# Patient Record
Sex: Female | Born: 1980 | Race: Black or African American | Hispanic: No | Marital: Single | Smoking: Never smoker
Health system: Southern US, Community
[De-identification: ages and names within clinical notes are randomized; demographics above are authoritative.]

## PROBLEM LIST (undated history)

## (undated) DIAGNOSIS — N939 Abnormal uterine and vaginal bleeding, unspecified: Secondary | ICD-10-CM

## (undated) DIAGNOSIS — D649 Anemia, unspecified: Secondary | ICD-10-CM

## (undated) DIAGNOSIS — I10 Essential (primary) hypertension: Secondary | ICD-10-CM

## (undated) DIAGNOSIS — G4733 Obstructive sleep apnea (adult) (pediatric): Secondary | ICD-10-CM

## (undated) DIAGNOSIS — J301 Allergic rhinitis due to pollen: Secondary | ICD-10-CM

## (undated) DIAGNOSIS — E119 Type 2 diabetes mellitus without complications: Secondary | ICD-10-CM

## (undated) DIAGNOSIS — F0781 Postconcussional syndrome: Secondary | ICD-10-CM

## (undated) DIAGNOSIS — E785 Hyperlipidemia, unspecified: Secondary | ICD-10-CM

## (undated) DIAGNOSIS — D219 Benign neoplasm of connective and other soft tissue, unspecified: Secondary | ICD-10-CM

## (undated) DIAGNOSIS — K219 Gastro-esophageal reflux disease without esophagitis: Secondary | ICD-10-CM

## (undated) DIAGNOSIS — Z973 Presence of spectacles and contact lenses: Secondary | ICD-10-CM

## (undated) HISTORY — DX: Gastro-esophageal reflux disease without esophagitis: K21.9

## (undated) HISTORY — DX: Postconcussional syndrome: F07.81

## (undated) HISTORY — DX: Allergic rhinitis due to pollen: J30.1

## (undated) HISTORY — DX: Abnormal uterine and vaginal bleeding, unspecified: N93.9

## (undated) HISTORY — PX: ANKLE FRACTURE SURGERY: SHX122

## (undated) HISTORY — DX: Benign neoplasm of connective and other soft tissue, unspecified: D21.9

## (undated) HISTORY — DX: Type 2 diabetes mellitus without complications: E11.9

## (undated) HISTORY — DX: Anemia, unspecified: D64.9

---
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
## 2010-06-11 ENCOUNTER — Emergency Department (HOSPITAL_COMMUNITY): Admission: EM | Admit: 2010-06-11 | Discharge: 2010-06-11 | Payer: Self-pay | Admitting: Family Medicine

## 2010-09-18 ENCOUNTER — Emergency Department (HOSPITAL_COMMUNITY): Admission: EM | Admit: 2010-09-18 | Discharge: 2010-09-18 | Payer: Self-pay | Admitting: Family Medicine

## 2010-11-19 ENCOUNTER — Emergency Department (HOSPITAL_COMMUNITY)
Admission: EM | Admit: 2010-11-19 | Discharge: 2010-11-19 | Payer: Self-pay | Source: Home / Self Care | Admitting: Family Medicine

## 2010-12-02 ENCOUNTER — Emergency Department (HOSPITAL_COMMUNITY)
Admission: EM | Admit: 2010-12-02 | Discharge: 2010-12-02 | Payer: Self-pay | Source: Home / Self Care | Admitting: Emergency Medicine

## 2011-02-28 LAB — POCT URINALYSIS DIPSTICK
Glucose, UA: NEGATIVE mg/dL
Hgb urine dipstick: NEGATIVE
Ketones, ur: 15 mg/dL — AB
Nitrite: NEGATIVE
Protein, ur: 100 mg/dL — AB
Specific Gravity, Urine: 1.025 (ref 1.005–1.030)
Urobilinogen, UA: 1 mg/dL (ref 0.0–1.0)
pH: 6 (ref 5.0–8.0)

## 2011-02-28 LAB — POCT PREGNANCY, URINE: Preg Test, Ur: NEGATIVE

## 2011-03-03 LAB — POCT URINALYSIS DIPSTICK
Bilirubin Urine: NEGATIVE
Glucose, UA: NEGATIVE mg/dL
Hgb urine dipstick: NEGATIVE
Ketones, ur: NEGATIVE mg/dL
Nitrite: NEGATIVE
Protein, ur: NEGATIVE mg/dL
Specific Gravity, Urine: 1.015 (ref 1.005–1.030)
Urobilinogen, UA: 1 mg/dL (ref 0.0–1.0)
pH: 7.5 (ref 5.0–8.0)

## 2011-03-03 LAB — POCT PREGNANCY, URINE: Preg Test, Ur: NEGATIVE

## 2011-09-17 ENCOUNTER — Emergency Department (HOSPITAL_COMMUNITY)
Admission: EM | Admit: 2011-09-17 | Discharge: 2011-09-17 | Disposition: A | Discharge status: Home / Self Care | Payer: Self-pay | Attending: Emergency Medicine | Admitting: Emergency Medicine

## 2011-09-17 DIAGNOSIS — R1032 Left lower quadrant pain: Secondary | ICD-10-CM | POA: Insufficient documentation

## 2011-09-17 LAB — URINALYSIS, ROUTINE W REFLEX MICROSCOPIC
Bilirubin Urine: NEGATIVE
Glucose, UA: NEGATIVE mg/dL
Ketones, ur: NEGATIVE mg/dL
Nitrite: NEGATIVE
Protein, ur: NEGATIVE mg/dL
Specific Gravity, Urine: 1.01 (ref 1.005–1.030)
Urobilinogen, UA: 0.2 mg/dL (ref 0.0–1.0)
pH: 6.5 (ref 5.0–8.0)

## 2011-09-17 LAB — URINE MICROSCOPIC-ADD ON

## 2011-09-17 LAB — WET PREP, GENITAL
Trich, Wet Prep: NONE SEEN
Yeast Wet Prep HPF POC: NONE SEEN

## 2011-09-17 LAB — POCT PREGNANCY, URINE: Preg Test, Ur: NEGATIVE

## 2011-09-19 LAB — GC/CHLAMYDIA PROBE AMP, GENITAL
Chlamydia, DNA Probe: NEGATIVE
GC Probe Amp, Genital: NEGATIVE

## 2011-10-26 ENCOUNTER — Encounter: Payer: Self-pay | Admitting: *Deleted

## 2011-10-26 ENCOUNTER — Emergency Department (INDEPENDENT_AMBULATORY_CARE_PROVIDER_SITE_OTHER)
Admission: EM | Admit: 2011-10-26 | Discharge: 2011-10-26 | Disposition: A | Discharge status: Home / Self Care | Payer: Self-pay | Source: Home / Self Care | Attending: Family Medicine | Admitting: Family Medicine

## 2011-10-26 DIAGNOSIS — K047 Periapical abscess without sinus: Secondary | ICD-10-CM

## 2011-10-26 MED ORDER — AMOXICILLIN 500 MG PO CAPS: 500.0000 mg | ORAL_CAPSULE | Freq: Three times a day (TID) | ORAL | Status: AC

## 2011-10-26 MED ORDER — IBUPROFEN 800 MG PO TABS: 800.0000 mg | ORAL_TABLET | Freq: Three times a day (TID) | ORAL | Status: AC

## 2011-10-26 NOTE — ED Provider Notes (Signed)
History     CSN: [id number] Arrival date & time: 10/26/2011  8:34 PM   First MD Initiated Contact with Patient 10/26/11 2055      Chief Complaint  Patient presents with  . Dental Pain    onset of right upper and lower dental pain x 2 weeks     (Consider location/radiation/quality/duration/timing/severity/associated sxs/prior treatment) Patient is a 30 y.o. female presenting with tooth pain. The history is provided by the patient.  Dental PainThe primary symptoms include mouth pain. The symptoms began more than 1 week ago. The symptoms are worsening. The symptoms are recurrent. The symptoms occur constantly.  Additional symptoms include: dental sensitivity to temperature. Additional symptoms do not include: facial swelling and ear pain.    Past Medical History  Diagnosis Date  . Asthma     Past Surgical History  Procedure Date  . Ankle fracture surgery     History reviewed. No pertinent family history.  History  Substance Use Topics  . Smoking status: Never Smoker   . Smokeless tobacco: Not on file  . Alcohol Use: No    OB History    Grav Para Term Preterm Abortions TAB SAB Ect Mult Living                  Review of Systems  Constitutional: Negative.   HENT: Negative for ear pain and facial swelling.     Allergies  Review of patient's allergies indicates no known allergies.  Home Medications   Current Outpatient Rx  Name Route Sig Dispense Refill  . IBUPROFEN 200 MG PO TABS Oral Take 200 mg by mouth every 6 (six) hours as needed.        BP 146/92  Pulse 76  Temp(Src) 98.3 F (36.8 C) (Oral)  Resp 16  SpO2 100%  Physical Exam  Nursing note and vitals reviewed. Constitutional: She appears well-developed and well-nourished.  HENT:  Head: Normocephalic and atraumatic.  Right Ear: External ear normal.  Left Ear: External ear normal.  Mouth/Throat: Oropharynx is clear and moist and mucous membranes are normal. Abnormal dentition. Dental caries  present. No oropharyngeal exudate.    Lymphadenopathy:       Head (left side): No submandibular and no tonsillar adenopathy present.    She has no cervical adenopathy.    ED Course  Procedures (including critical care time)  Labs Reviewed - No data to display No results found.   No diagnosis found.    MDM          Barkley Bruns, MD 10/26/11 2129

## 2012-01-16 ENCOUNTER — Encounter (HOSPITAL_COMMUNITY): Payer: Self-pay | Admitting: Emergency Medicine

## 2012-01-16 ENCOUNTER — Emergency Department (INDEPENDENT_AMBULATORY_CARE_PROVIDER_SITE_OTHER)
Admission: EM | Admit: 2012-01-16 | Discharge: 2012-01-16 | Disposition: A | Discharge status: Home / Self Care | Payer: Self-pay | Source: Home / Self Care | Attending: Emergency Medicine | Admitting: Emergency Medicine

## 2012-01-16 DIAGNOSIS — K529 Noninfective gastroenteritis and colitis, unspecified: Secondary | ICD-10-CM

## 2012-01-16 DIAGNOSIS — K5289 Other specified noninfective gastroenteritis and colitis: Secondary | ICD-10-CM

## 2012-01-16 NOTE — ED Notes (Signed)
Pt had what she thinks is the stomach flu over the weekend but no longer has any symptoms. 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

## 2012-01-16 NOTE — ED Provider Notes (Signed)
Chief Complaint  Patient presents with  . GI Problem    History of Present Illness:  The patient has had a four-day history of nausea, vomiting, and diarrhea. She denies any fever or chills. She did have some crampy abdominal pain. No blood in the vomitus or stool. She was exposed to a friend who had the same thing. Her symptoms have completely resolved right now and she just comes in to get a note to get back to work since she does work in [employer].  Review of Systems:  Other than noted above, the patient denies any of the following symptoms: Constitutional:  No fever, chills, fatigue, weight loss or anorexia. Lungs:  No cough or shortness of breath. Heart:  No chest pain, palpitations, syncope or edema. Abdomen:  No nausea, vomiting, hematememesis, melena, diarrhea, or hematochezia. GU:  No dysuria, frequency, urgency, or hematuria. Gyn:  No vaginal discharge, itching, abnormal bleeding or pelvic pain. Skin:  No rash or itching.  PMFSH:  Past medical history, family history, social history, meds, and allergies were reviewed.  Physical Exam:   Vital signs:  BP 129/87  Pulse 83  Temp(Src) 98.6 F (37 C) (Oral)  Resp 16  SpO2 96% Gen:  Alert, oriented, in no distress. Lungs:  Breath sounds clear and equal bilaterally.  No wheezes, rales or rhonchi. Heart:  Regular rhythm.  No gallops or murmers.   Abdomen:  Abdomen was soft and flat and nontender. No organomegaly or mass. Bowel sounds are normally active. Skin:  Clear, warm and dry.  No rash.  Assessment:   Diagnoses that have been ruled out:  None  Diagnoses that are still under consideration:  None  Final diagnoses:  Gastroenteritis    Plan:   1.  The following meds were prescribed:   New Prescriptions   No medications on file   2.  The patient was instructed in symptomatic care and handouts were given. 3.  The patient was told to return if becoming worse in any way, if no better in 3 or 4 days, and given some  red flag symptoms that would indicate earlier return.  I gave her a note to return to work tomorrow.    Roque Lias, MD 01/16/12 2122

## 2012-01-23 ENCOUNTER — Encounter (HOSPITAL_COMMUNITY): Payer: Self-pay | Admitting: Emergency Medicine

## 2012-01-23 ENCOUNTER — Other Ambulatory Visit: Payer: Self-pay

## 2012-01-23 ENCOUNTER — Emergency Department (HOSPITAL_COMMUNITY): Payer: Self-pay

## 2012-01-23 ENCOUNTER — Emergency Department (HOSPITAL_COMMUNITY)
Admission: EM | Admit: 2012-01-23 | Discharge: 2012-01-23 | Disposition: A | Discharge status: Home / Self Care | Payer: Self-pay | Attending: Emergency Medicine | Admitting: Emergency Medicine

## 2012-01-23 DIAGNOSIS — J45909 Unspecified asthma, uncomplicated: Secondary | ICD-10-CM | POA: Insufficient documentation

## 2012-01-23 DIAGNOSIS — R072 Precordial pain: Secondary | ICD-10-CM | POA: Insufficient documentation

## 2012-01-23 DIAGNOSIS — D649 Anemia, unspecified: Secondary | ICD-10-CM

## 2012-01-23 DIAGNOSIS — R0602 Shortness of breath: Secondary | ICD-10-CM | POA: Insufficient documentation

## 2012-01-23 DIAGNOSIS — R079 Chest pain, unspecified: Secondary | ICD-10-CM | POA: Insufficient documentation

## 2012-01-23 LAB — BASIC METABOLIC PANEL
BUN: 8 mg/dL (ref 6–23)
CO2: 18 mEq/L — ABNORMAL LOW (ref 19–32)
Calcium: 9.2 mg/dL (ref 8.4–10.5)
Chloride: 106 mEq/L (ref 96–112)
Creatinine, Ser: 0.59 mg/dL (ref 0.50–1.10)
GFR calc Af Amer: >90 mL/min (ref >90)
GFR calc non Af Amer: >90 mL/min (ref >90)
Glucose, Bld: 98 mg/dL (ref 70–99)
Potassium: 5.3 mEq/L — ABNORMAL HIGH (ref 3.5–5.1)
Sodium: 135 mEq/L (ref 135–145)

## 2012-01-23 LAB — POCT I-STAT TROPONIN I: Troponin i, poc: 0 ng/mL (ref 0.00–0.08)

## 2012-01-23 LAB — DIFFERENTIAL
Basophils Absolute: 0 10*3/uL (ref 0.0–0.1)
Basophils Relative: 0 % (ref 0–1)
Eosinophils Absolute: 0.1 10*3/uL (ref 0.0–0.7)
Eosinophils Relative: 1 % (ref 0–5)
Lymphocytes Relative: 37 % (ref 12–46)
Lymphs Abs: 3.4 10*3/uL (ref 0.7–4.0)
Monocytes Absolute: 1 10*3/uL (ref 0.1–1.0)
Monocytes Relative: 11 % (ref 3–12)
Neutro Abs: 4.6 10*3/uL (ref 1.7–7.7)
Neutrophils Relative %: 51 % (ref 43–77)

## 2012-01-23 LAB — CBC
HCT: 30.4 % — ABNORMAL LOW (ref 36.0–46.0)
Hemoglobin: 9.2 g/dL — ABNORMAL LOW (ref 12.0–15.0)
MCH: 21.4 pg — ABNORMAL LOW (ref 26.0–34.0)
MCHC: 30.3 g/dL (ref 30.0–36.0)
MCV: 70.9 fL — ABNORMAL LOW (ref 78.0–100.0)
Platelets: 360 10*3/uL (ref 150–400)
RBC: 4.29 MIL/uL (ref 3.87–5.11)
RDW: 16.9 % — ABNORMAL HIGH (ref 11.5–15.5)
WBC: 9.1 10*3/uL (ref 4.0–10.5)

## 2012-01-23 MED ORDER — FERROUS SULFATE 325 (65 FE) MG PO TABS: 325.0000 mg | ORAL_TABLET | Freq: Every day | ORAL | Status: DC

## 2012-01-23 NOTE — ED Notes (Signed)
PT. REPORTS MID STERNAL CHEST PAIN FOR 2 WEEKS WITH SOB ,  DENIES NAUSEA OR VOMITTTING  , NO DIAPHORESIS . PT. STATES RECENT DEATH IN FAMILY .

## 2012-01-23 NOTE — ED Provider Notes (Signed)
History     CSN: [id number]  Arrival date & time 01/23/12  8657   First MD Initiated Contact with Patient 01/23/12 1947      Chief Complaint  Patient presents with  . Chest Pain     HPI  History provided by the patient. Patient is a 31 year old African American female with history of asthma presents with complaints of intermittent sternal chest pain for the past several weeks. Patient reports having a tightness associated with shortness of breath, fast breathing and increased heart rate lasting approximately 10-15 minutes intermittently. She reports symptoms are sometimes worse with decreased sleep. Patient denies having any other aggravating or alleviating factors associated with symptoms. She denies having any diaphoresis, nausea or vomiting. Patient does report the symptoms began shortly after her mother's death on 12/30/22. Her mother passed away from a intentional overdose. She denies having any family history of early cardiac death. She denies having any wheezing or asthma symptoms.    Past Medical History  Diagnosis Date  . Asthma     Past Surgical History  Procedure Date  . Ankle fracture surgery     No family history on file.  History  Substance Use Topics  . Smoking status: Never Smoker   . Smokeless tobacco: Not on file  . Alcohol Use: No    OB History    Grav Para Term Preterm Abortions TAB SAB Ect Mult Living                  Review of Systems  Constitutional: Negative for fever, chills and diaphoresis.  Respiratory: Positive for shortness of breath. Negative for cough and wheezing.   Cardiovascular: Positive for chest pain and palpitations.  Gastrointestinal: Negative for nausea, vomiting, abdominal pain, diarrhea and constipation.  All other systems reviewed and are negative.    Allergies  Review of patient's allergies indicates no known allergies.  Home Medications  No current outpatient prescriptions on file.  BP 140/85  Pulse 80  Temp  98.5 F (36.9 C)  Resp 16  SpO2 100%  LMP 12/30/2011  Physical Exam  Nursing note and vitals reviewed. Constitutional: She is oriented to person, place, and time. She appears well-developed and well-nourished. No distress.  HENT:  Head: Normocephalic and atraumatic.  Mouth/Throat: Oropharynx is clear and moist.  Neck: Normal range of motion. Neck supple.  Cardiovascular: Normal rate, regular rhythm and normal heart sounds.   Pulmonary/Chest: Effort normal and breath sounds normal. No respiratory distress. She has no wheezes. She has no rales.  Abdominal: Soft. There is no tenderness.  Musculoskeletal: She exhibits no edema and no tenderness.  Lymphadenopathy:    She has no cervical adenopathy.  Neurological: She is alert and oriented to person, place, and time.  Skin: Skin is warm and dry. No rash noted. She is not diaphoretic.  Psychiatric: She has a normal mood and affect. Her behavior is normal.    ED Course  Procedures    Labs Reviewed  CBC  DIFFERENTIAL  BASIC METABOLIC PANEL   Results for orders placed during the hospital encounter of 01/23/12  BASIC METABOLIC PANEL      Component Value Range   Sodium 135  135 - 145 (mEq/L)   Potassium 5.3 (*) 3.5 - 5.1 (mEq/L)   Chloride 106  96 - 112 (mEq/L)   CO2 18 (*) 19 - 32 (mEq/L)   Glucose, Bld 98  70 - 99 (mg/dL)   BUN 8  6 - 23 (mg/dL)  Creatinine, Ser 0.59  0.50 - 1.10 (mg/dL)   Calcium 9.2  8.4 - 69.6 (mg/dL)   GFR calc non Af Amer >90  >90 (mL/min)   GFR calc Af Amer >90  >90 (mL/min)  POCT I-STAT TROPONIN I      Component Value Range   Troponin i, poc 0.00  0.00 - 0.08 (ng/mL)   Comment 3           CBC      Component Value Range   WBC 9.1  4.0 - 10.5 (K/uL)   RBC 4.29  3.87 - 5.11 (MIL/uL)   Hemoglobin 9.2 (*) 12.0 - 15.0 (g/dL)   HCT 29.5 (*) 28.4 - 46.0 (%)   MCV 70.9 (*) 78.0 - 100.0 (fL)   MCH 21.4 (*) 26.0 - 34.0 (pg)   MCHC 30.3  30.0 - 36.0 (g/dL)   RDW 13.2 (*) 44.0 - 15.5 (%)   Platelets 360   150 - 400 (K/uL)  DIFFERENTIAL      Component Value Range   Neutrophils Relative 51  43 - 77 (%)   Neutro Abs 4.6  1.7 - 7.7 (K/uL)   Lymphocytes Relative 37  12 - 46 (%)   Lymphs Abs 3.4  0.7 - 4.0 (K/uL)   Monocytes Relative 11  3 - 12 (%)   Monocytes Absolute 1.0  0.1 - 1.0 (K/uL)   Eosinophils Relative 1  0 - 5 (%)   Eosinophils Absolute 0.1  0.0 - 0.7 (K/uL)   Basophils Relative 0  0 - 1 (%)   Basophils Absolute 0.0  0.0 - 0.1 (K/uL)     Dg Chest 2 View  01/23/2012  *RADIOLOGY REPORT*  Clinical Data: Left-sided chest pain and pressure  CHEST - 2 VIEW  Comparison: None  Findings: Artifact overlies the chest.  Heart size is normal. Mediastinal shadows are normal.  Lungs are clear.  The vascularity is normal.  No effusions.  No bony abnormalities.  IMPRESSION: No active disease  Original Report Authenticated By: Thomasenia Sales, M.D.     1. Chest pain   2. Anemia       MDM  7:50 PM patient seen and evaluated. Patient in no acute distress. Pt is PERC negative.  Patient continues to do well. EKG chest x-ray with no significant findings. The patient was discussed with attending physician. He agrees with evaluation and treatment plan.   Date: 01/23/2012  Rate: 80  Rhythm: normal sinus rhythm  QRS Axis: normal  Intervals: normal  ST/T Wave abnormalities: nonspecific T wave changes  Conduction Disutrbances:none  Narrative Interpretation: flat T-waves  Old EKG Reviewed: none available       Angus Seller, PA 01/23/12 2259

## 2012-01-24 NOTE — ED Provider Notes (Signed)
Medical screening examination/treatment/procedure(s) were conducted as a shared visit with non-physician practitioner(s) and myself.  I personally evaluated the patient during the encounter    Celene Kras, MD 01/24/12 1057

## 2012-07-23 ENCOUNTER — Encounter (HOSPITAL_COMMUNITY): Payer: Self-pay | Admitting: Emergency Medicine

## 2012-07-23 ENCOUNTER — Emergency Department (INDEPENDENT_AMBULATORY_CARE_PROVIDER_SITE_OTHER)
Admission: EM | Admit: 2012-07-23 | Discharge: 2012-07-23 | Disposition: A | Discharge status: Home / Self Care | Payer: Self-pay | Source: Home / Self Care | Attending: Emergency Medicine | Admitting: Emergency Medicine

## 2012-07-23 ENCOUNTER — Emergency Department (INDEPENDENT_AMBULATORY_CARE_PROVIDER_SITE_OTHER): Payer: Self-pay

## 2012-07-23 DIAGNOSIS — M940 Chondrocostal junction syndrome [Tietze]: Secondary | ICD-10-CM

## 2012-07-23 MED ORDER — MELOXICAM 15 MG PO TABS: 15.0000 mg | ORAL_TABLET | Freq: Every day | ORAL | Status: DC

## 2012-07-23 MED ORDER — PREDNISONE 5 MG PO KIT: 1.0000 | PACK | Freq: Every day | ORAL | Status: DC

## 2012-07-23 NOTE — ED Provider Notes (Signed)
Chief Complaint  Patient presents with  . Chest Pain    History of Present Illness:   The patient is a 31 year old female with a history since last night of sharp right pectoral chest pain which radiates into her neck. The pain comes and goes. It is rated as 7/10 in intensity, nothing makes it worse or better. She tried Advil without relief. She has some neck pain for about the past 2 weeks. She denies any associated fever or chills. No diaphoresis, shortness of breath, coughing, wheezing. She denies any palpitations, dizziness, or syncope. She has no history of heart problems or lung problems. She denies any abdominal pain, nausea, or vomiting. She was seen in the emergency room in February for similar chest pain. She had a normal workup there including a troponin.  Review of Systems:  Other than noted above, the patient denies any of the following symptoms. Systemic:  No fever, chills, sweats, or fatigue. ENT:  No nasal congestion, rhinorrhea, or sore throat. Pulmonary:  No cough, wheezing, shortness of breath, sputum production, hemoptysis. Cardiac:  No palpitations, rapid heartbeat, dizziness, presyncope or syncope. GI:  No abdominal pain, heartburn, nausea, or vomiting. Skin:  No rash or itching. Ext:  No leg pain or swelling.   PMFSH:  Past medical history, family history, social history, meds, and allergies were reviewed and updated as needed.  Physical Exam:   Vital signs:  BP 145/94  Pulse 62  Temp 98.4 F (36.9 C) (Oral)  Resp 16  SpO2 100%  LMP 07/06/2012 Gen:  Alert, oriented, in no distress, skin warm and dry. Eye:  PERRL, lids and conjunctivas normal.  Sclera non-icteric. ENT:  Mucous membranes moist, pharynx clear. Neck:  Supple, no adenopathy or tenderness.  No JVD. Lungs:  Clear to auscultation, no wheezes, rales or rhonchi.  No respiratory distress. Heart:  Regular rhythm.  No gallops, murmers, clicks or rubs. Chest:  There is mild tenderness to palpation in the  right pectoral area and over the right upper sternal border and over the sternum. Abdomen:  Soft, nontender, no organomegaly or mass.  Bowel sounds normal.  No pulsatile abdominal mass or bruit. Ext:  No edema.  No calf tenderness and Homann's sign negative.  Pulses full and equal. Skin:  Warm and dry.  No rash.   Radiology:  Dg Chest 2 View  07/23/2012  *RADIOLOGY REPORT*  Clinical Data: Right-sided chest pain for 2 days.  CHEST - 2 VIEW  Comparison: 01/23/2012.  Findings:  Cardiopericardial silhouette within normal limits. Mediastinal contours normal. Trachea midline.  No airspace disease or effusion.  IMPRESSION: No active cardiopulmonary disease.  Original Report Authenticated By: Andreas Newport, M.D.    EKG:   Date: 07/23/2012  Rate: 63  Rhythm: normal sinus rhythm  QRS Axis: normal  Intervals: normal  ST/T Wave abnormalities: nonspecific T wave changes  Conduction Disutrbances:none  Narrative Interpretation: Normal sinus rhythm, nonspecific T wave abnormalities with T wave inversions in leads III, aVF, V3, V4, and V5. Reviewing a previous EKG from 01/23/2012 reveals no change.  Old EKG Reviewed: unchanged  Assessment:  The encounter diagnosis was Costochondritis.   Plan:   1.  The following meds were prescribed:   New Prescriptions   MELOXICAM (MOBIC) 15 MG TABLET    Take 1 tablet (15 mg total) by mouth daily.   PREDNISONE 5 MG KIT    Take 1 kit (5 mg total) by mouth daily after breakfast. Prednisone 5 mg 6 day dosepack.  Take as  directed.   2.  The patient was instructed in symptomatic care and handouts were given. 3.  The patient was told to return if becoming worse in any way, if no better in 3 or 4 days, and given some red flag symptoms that would indicate earlier return.    Reuben Likes, MD 07/23/12 2055

## 2012-07-23 NOTE — ED Notes (Signed)
Pt with c/o right side upper chest pain last night when attempting to turn over in bed -intermittent sharp pain -  denies pain at this time

## 2012-09-10 ENCOUNTER — Emergency Department (HOSPITAL_COMMUNITY)
Admission: EM | Admit: 2012-09-10 | Discharge: 2012-09-10 | Disposition: A | Discharge status: Home / Self Care | Payer: Self-pay | Source: Home / Self Care

## 2012-09-12 ENCOUNTER — Emergency Department (HOSPITAL_COMMUNITY)
Admission: EM | Admit: 2012-09-12 | Discharge: 2012-09-12 | Disposition: A | Discharge status: Home / Self Care | Payer: Self-pay | Attending: Emergency Medicine | Admitting: Emergency Medicine

## 2012-09-12 ENCOUNTER — Encounter (HOSPITAL_COMMUNITY): Payer: Self-pay | Admitting: Emergency Medicine

## 2012-09-12 DIAGNOSIS — N949 Unspecified condition associated with female genital organs and menstrual cycle: Secondary | ICD-10-CM | POA: Insufficient documentation

## 2012-09-12 DIAGNOSIS — D649 Anemia, unspecified: Secondary | ICD-10-CM | POA: Insufficient documentation

## 2012-09-12 DIAGNOSIS — N938 Other specified abnormal uterine and vaginal bleeding: Secondary | ICD-10-CM | POA: Insufficient documentation

## 2012-09-12 DIAGNOSIS — N925 Other specified irregular menstruation: Secondary | ICD-10-CM | POA: Insufficient documentation

## 2012-09-12 LAB — URINALYSIS, ROUTINE W REFLEX MICROSCOPIC
Bilirubin Urine: NEGATIVE
Glucose, UA: NEGATIVE mg/dL
Ketones, ur: 15 mg/dL — AB
Nitrite: NEGATIVE
Protein, ur: 30 mg/dL — AB
Specific Gravity, Urine: 1.027 (ref 1.005–1.030)
Urobilinogen, UA: 0.2 mg/dL (ref 0.0–1.0)
pH: 5.5 (ref 5.0–8.0)

## 2012-09-12 LAB — CBC WITH DIFFERENTIAL/PLATELET
Basophils Absolute: 0.1 10*3/uL (ref 0.0–0.1)
Basophils Relative: 1 % (ref 0–1)
Eosinophils Absolute: 0.1 10*3/uL (ref 0.0–0.7)
Eosinophils Relative: 1 % (ref 0–5)
HCT: 32.6 % — ABNORMAL LOW (ref 36.0–46.0)
Hemoglobin: 9.9 g/dL — ABNORMAL LOW (ref 12.0–15.0)
Lymphocytes Relative: 34 % (ref 12–46)
Lymphs Abs: 2 10*3/uL (ref 0.7–4.0)
MCH: 22.1 pg — ABNORMAL LOW (ref 26.0–34.0)
MCHC: 30.4 g/dL (ref 30.0–36.0)
MCV: 72.8 fL — ABNORMAL LOW (ref 78.0–100.0)
Monocytes Absolute: 0.5 10*3/uL (ref 0.1–1.0)
Monocytes Relative: 9 % (ref 3–12)
Neutro Abs: 3.1 10*3/uL (ref 1.7–7.7)
Neutrophils Relative %: 55 % (ref 43–77)
Platelets: 407 10*3/uL — ABNORMAL HIGH (ref 150–400)
RBC: 4.48 MIL/uL (ref 3.87–5.11)
RDW: 17.7 % — ABNORMAL HIGH (ref 11.5–15.5)
WBC: 5.8 10*3/uL (ref 4.0–10.5)

## 2012-09-12 LAB — URINE MICROSCOPIC-ADD ON

## 2012-09-12 LAB — BASIC METABOLIC PANEL
BUN: 8 mg/dL (ref 6–23)
CO2: 26 mEq/L (ref 19–32)
Calcium: 9.5 mg/dL (ref 8.4–10.5)
Chloride: 104 mEq/L (ref 96–112)
Creatinine, Ser: 0.79 mg/dL (ref 0.50–1.10)
GFR calc Af Amer: >90 mL/min (ref >90)
GFR calc non Af Amer: >90 mL/min (ref >90)
Glucose, Bld: 78 mg/dL (ref 70–99)
Potassium: 3.5 mEq/L (ref 3.5–5.1)
Sodium: 141 mEq/L (ref 135–145)

## 2012-09-12 LAB — WET PREP, GENITAL
Clue Cells Wet Prep HPF POC: NONE SEEN
Trich, Wet Prep: NONE SEEN
Yeast Wet Prep HPF POC: NONE SEEN

## 2012-09-12 LAB — PREGNANCY, URINE: Preg Test, Ur: NEGATIVE

## 2012-09-12 MED ORDER — NORELGESTROMIN-ETH ESTRADIOL 150-35 MCG/24HR TD PTWK
1.0000 | MEDICATED_PATCH | TRANSDERMAL | Status: DC
Start: 2012-09-12 — End: 2012-12-27

## 2012-09-12 MED ORDER — IBUPROFEN 800 MG PO TABS
800.0000 mg | ORAL_TABLET | Freq: Once | ORAL | Status: AC
  Administered 2012-09-12: 800 mg via ORAL
  Filled 2012-09-12: qty 1

## 2012-09-12 MED ORDER — FERROUS SULFATE 325 (65 FE) MG PO TABS: 325.0000 mg | ORAL_TABLET | Freq: Every day | ORAL | Status: DC

## 2012-09-12 NOTE — ED Notes (Signed)
lef sided abd pain  For a few years got bad when this period started

## 2012-09-12 NOTE — ED Provider Notes (Signed)
History     CSN: [id number]  Arrival date & time 09/12/12  1056   First MD Initiated Contact with Patient 09/12/12 1130      Chief Complaint  Patient presents with  . Abdominal Pain    (Consider location/radiation/quality/duration/timing/severity/associated sxs/prior treatment) The history is provided by the patient.    31 y.o. female INAD c/o chronic heavy menstrual bleed x2 years. Pt states that she can go through 20 tampons per days, lasts 5-6 days, and irregular. Pt has never seen an Ob/gyn. Denies CP, palpations, SOB, lightheaded sensation.   LMP 09/12/2012  Past Medical History  Diagnosis Date  . Asthma     Past Surgical History  Procedure Date  . Ankle fracture surgery     No family history on file.  History  Substance Use Topics  . Smoking status: Never Smoker   . Smokeless tobacco: Not on file  . Alcohol Use: No    OB History    Grav Para Term Preterm Abortions TAB SAB Ect Mult Living                  Review of Systems  Constitutional: Negative for fever.  Respiratory: Negative for shortness of breath.   Cardiovascular: Negative for chest pain.  Gastrointestinal: Negative for nausea, vomiting, abdominal pain and diarrhea.  Genitourinary: Positive for menstrual problem.  All other systems reviewed and are negative.    Allergies  Review of patient's allergies indicates no known allergies.  Home Medications   Current Outpatient Rx  Name Route Sig Dispense Refill  . IBUPROFEN 200 MG PO TABS Oral Take 400 mg by mouth every 6 (six) hours as needed. For pain      BP 142/87  Pulse 72  Temp 98.8 F (37.1 C) (Oral)  Resp 18  SpO2 100%  Physical Exam  Nursing note and vitals reviewed. Constitutional: She is oriented to person, place, and time. She appears well-developed and well-nourished. No distress.  HENT:  Head: Normocephalic.  Mouth/Throat: Oropharynx is clear and moist.       Pale conjunctiva   Eyes: Conjunctivae normal and EOM are  normal.  Neck: Normal range of motion.  Cardiovascular: Normal rate and intact distal pulses.   Pulmonary/Chest: Effort normal and breath sounds normal. No stridor. No respiratory distress. She has no wheezes. She has no rales. She exhibits no tenderness.  Abdominal: Soft. Bowel sounds are normal. She exhibits no distension and no mass. There is no tenderness. There is no rebound and no guarding.  Genitourinary: Uterus normal. There is no rash or lesion on the right labia. There is no rash or lesion on the left labia. Uterus is not enlarged and not tender. Cervix exhibits no motion tenderness, no discharge and no friability. Right adnexum displays no mass, no tenderness and no fullness. Left adnexum displays no mass, no tenderness and no fullness.       Chaperoned by RN  No rash or cervical or adnexal tenderness. Dark blood from closed os, pooled in posterior fourchette.   Musculoskeletal: Normal range of motion.  Neurological: She is alert and oriented to person, place, and time.  Psychiatric: She has a normal mood and affect.    ED Course  Procedures (including critical care time)  Labs Reviewed  CBC WITH DIFFERENTIAL - Abnormal; Notable for the following:    Hemoglobin 9.9 (*)     HCT 32.6 (*)     MCV 72.8 (*)     MCH 22.1 (*)  RDW 17.7 (*)     Platelets 407 (*)     All other components within normal limits  URINALYSIS, ROUTINE W REFLEX MICROSCOPIC - Abnormal; Notable for the following:    APPearance HAZY (*)     Hgb urine dipstick LARGE (*)     Ketones, ur 15 (*)     Protein, ur 30 (*)     Leukocytes, UA SMALL (*)     All other components within normal limits  URINE MICROSCOPIC-ADD ON - Abnormal; Notable for the following:    Squamous Epithelial / LPF FEW (*)     All other components within normal limits  PREGNANCY, URINE  BASIC METABOLIC PANEL  WET PREP, GENITAL  GC/CHLAMYDIA PROBE AMP, GENITAL   No results found.   1. DUB (dysfunctional uterine bleeding)        MDM  dDx fibroids vs. DUB vs PCOS.   Pt has chronic menorrhagia is mildly anemic, no abnormalities on pelvic exam.    Pelvic does not indicate uterine mass. Recommend Ob follow up.   New Prescriptions   FERROUS SULFATE 325 (65 FE) MG TABLET    Take 1 tablet (325 mg total) by mouth daily.   NORELGESTROMIN-ETHINYL ESTRADIOL (ORTHO EVRA) 150-20 MCG/24HR TRANSDERMAL PATCH    Place 1 patch onto the skin once a week.         Wynetta Emery, PA-C 09/12/12 1421

## 2012-09-13 LAB — GC/CHLAMYDIA PROBE AMP, GENITAL
Chlamydia, DNA Probe: NEGATIVE
GC Probe Amp, Genital: NEGATIVE

## 2012-09-15 NOTE — ED Provider Notes (Signed)
Medical screening examination/treatment/procedure(s) were performed by non-physician practitioner and as supervising physician I was immediately available for consultation/collaboration.  Flint Melter, MD 09/15/12 1152

## 2012-12-27 ENCOUNTER — Encounter (HOSPITAL_COMMUNITY): Payer: Self-pay | Admitting: Emergency Medicine

## 2012-12-27 ENCOUNTER — Emergency Department (HOSPITAL_COMMUNITY)
Admission: EM | Admit: 2012-12-27 | Discharge: 2012-12-27 | Disposition: A | Discharge status: Home / Self Care | Payer: Self-pay | Attending: Emergency Medicine | Admitting: Emergency Medicine

## 2012-12-27 DIAGNOSIS — J020 Streptococcal pharyngitis: Secondary | ICD-10-CM | POA: Insufficient documentation

## 2012-12-27 DIAGNOSIS — IMO0001 Reserved for inherently not codable concepts without codable children: Secondary | ICD-10-CM | POA: Insufficient documentation

## 2012-12-27 DIAGNOSIS — R52 Pain, unspecified: Secondary | ICD-10-CM | POA: Insufficient documentation

## 2012-12-27 DIAGNOSIS — Z79899 Other long term (current) drug therapy: Secondary | ICD-10-CM | POA: Insufficient documentation

## 2012-12-27 DIAGNOSIS — J3489 Other specified disorders of nose and nasal sinuses: Secondary | ICD-10-CM | POA: Insufficient documentation

## 2012-12-27 DIAGNOSIS — J45909 Unspecified asthma, uncomplicated: Secondary | ICD-10-CM | POA: Insufficient documentation

## 2012-12-27 LAB — RAPID STREP SCREEN (MED CTR MEBANE ONLY): Streptococcus, Group A Screen (Direct): NEGATIVE

## 2012-12-27 MED ORDER — ACETAMINOPHEN 500 MG PO TABS: 500.0000 mg | ORAL_TABLET | Freq: Four times a day (QID) | ORAL | Status: DC | PRN

## 2012-12-27 MED ORDER — PENICILLIN G BENZATHINE 1200000 UNIT/2ML IM SUSP
1.2000 10*6.[IU] | Freq: Once | INTRAMUSCULAR | Status: AC
  Administered 2012-12-27: 1.2 10*6.[IU] via INTRAMUSCULAR
  Filled 2012-12-27: qty 2

## 2012-12-27 NOTE — ED Notes (Signed)
Onset 1 day ago dry cough and nasal congestion yellow with general body achy and sore throat 8/10 airway intact bilateral equal chest rise and fall.

## 2012-12-27 NOTE — ED Notes (Signed)
Onset of cold sx began last night. Today presents with runny nose, cough, weakness. A&Ox4, ambulatory, nad. Mask on from triage.

## 2012-12-27 NOTE — ED Provider Notes (Signed)
History     CSN: [id number]  Arrival date & time 12/27/12  1024   First MD Initiated Contact with Patient 12/27/12 1046      Chief Complaint  Patient presents with  . Cough  . Generalized Body Aches    (Consider location/radiation/quality/duration/timing/severity/associated sxs/prior treatment) HPI Comments: Patient is a 32 year old female who presents with a 1 day history of sore throat. Symptoms started gradually and progressively worsened since the onset. The pain is sharp, severe, and does not radiate. The pain is constant and made worse with swallowing and talking. She has not tried anything for symptoms. Associated symptoms include body aches, nasal congestion, and occasional dry cough. No alleviating factors.    Past Medical History  Diagnosis Date  . Asthma     Past Surgical History  Procedure Date  . Ankle fracture surgery     No family history on file.  History  Substance Use Topics  . Smoking status: Never Smoker   . Smokeless tobacco: Not on file  . Alcohol Use: No    OB History    Grav Para Term Preterm Abortions TAB SAB Ect Mult Living                  Review of Systems  HENT: Positive for congestion and sore throat.   Musculoskeletal: Positive for myalgias.  All other systems reviewed and are negative.    Allergies  Review of patient's allergies indicates no known allergies.  Home Medications   Current Outpatient Rx  Name  Route  Sig  Dispense  Refill  . GUAIFENESIN 100 MG/5ML PO SYRP   Oral   Take 400 mg by mouth 3 (three) times daily as needed. For cough         . LORATADINE 10 MG PO TABS   Oral   Take 10 mg by mouth daily.           BP 133/87  Pulse 75  Temp 98.3 F (36.8 C) (Oral)  Resp 18  SpO2 97%  Physical Exam  Nursing note and vitals reviewed. Constitutional: She is oriented to person, place, and time. She appears well-developed and well-nourished. No distress.  HENT:  Head: Normocephalic and atraumatic.   Tonsillar erythema, edema and exudate noted. Edema is equal bilaterally.   Eyes: Conjunctivae normal are normal.  Neck: Normal range of motion. Neck supple.  Cardiovascular: Normal rate and regular rhythm.  Exam reveals no gallop and no friction rub.   No murmur heard. Pulmonary/Chest: Effort normal and breath sounds normal. She has no wheezes. She has no rales. She exhibits no tenderness.  Abdominal: Soft. There is no tenderness.  Musculoskeletal: Normal range of motion.  Neurological: She is alert and oriented to person, place, and time. Coordination normal.       Speech is goal-oriented. Moves limbs without ataxia.   Skin: Skin is warm and dry.  Psychiatric: She has a normal mood and affect. Her behavior is normal.    ED Course  Procedures (including critical care time)   Labs Reviewed  RAPID STREP SCREEN   No results found.   1. Strep pharyngitis       MDM  11:14 AM Rapid strep pending.   Strep test negative, however, I will treat her empirically due to clinical impression. Patient will have Penicillin G. I will discharge her with Tylenol for pain. Patient afebrile and non toxic appearing. Patient instructed to return with worsening or concerning symptoms.  Emilia Beck, PA-C 12/31/12 1459

## 2012-12-31 NOTE — ED Provider Notes (Signed)
Medical screening examination/treatment/procedure(s) were performed by non-physician practitioner and as supervising physician I was immediately available for consultation/collaboration.   David H Yao, MD 12/31/12 1505 

## 2013-01-01 ENCOUNTER — Encounter (HOSPITAL_COMMUNITY): Payer: Self-pay | Admitting: *Deleted

## 2013-01-01 ENCOUNTER — Emergency Department (HOSPITAL_COMMUNITY): Payer: Self-pay

## 2013-01-01 ENCOUNTER — Emergency Department (HOSPITAL_COMMUNITY)
Admission: EM | Admit: 2013-01-01 | Discharge: 2013-01-01 | Disposition: A | Discharge status: Home / Self Care | Payer: Self-pay | Attending: Emergency Medicine | Admitting: Emergency Medicine

## 2013-01-01 DIAGNOSIS — R1031 Right lower quadrant pain: Secondary | ICD-10-CM | POA: Insufficient documentation

## 2013-01-01 DIAGNOSIS — J45909 Unspecified asthma, uncomplicated: Secondary | ICD-10-CM | POA: Insufficient documentation

## 2013-01-01 DIAGNOSIS — Z3202 Encounter for pregnancy test, result negative: Secondary | ICD-10-CM | POA: Insufficient documentation

## 2013-01-01 DIAGNOSIS — B9689 Other specified bacterial agents as the cause of diseases classified elsewhere: Secondary | ICD-10-CM

## 2013-01-01 DIAGNOSIS — R34 Anuria and oliguria: Secondary | ICD-10-CM | POA: Insufficient documentation

## 2013-01-01 DIAGNOSIS — R109 Unspecified abdominal pain: Secondary | ICD-10-CM

## 2013-01-01 DIAGNOSIS — Z79899 Other long term (current) drug therapy: Secondary | ICD-10-CM | POA: Insufficient documentation

## 2013-01-01 DIAGNOSIS — R112 Nausea with vomiting, unspecified: Secondary | ICD-10-CM | POA: Insufficient documentation

## 2013-01-01 DIAGNOSIS — N760 Acute vaginitis: Secondary | ICD-10-CM | POA: Insufficient documentation

## 2013-01-01 LAB — CBC WITH DIFFERENTIAL/PLATELET
Basophils Absolute: 0 10*3/uL (ref 0.0–0.1)
Basophils Relative: 0 % (ref 0–1)
Eosinophils Absolute: 0.1 10*3/uL (ref 0.0–0.7)
Eosinophils Relative: 0 % (ref 0–5)
HCT: 34.9 % — ABNORMAL LOW (ref 36.0–46.0)
Hemoglobin: 11 g/dL — ABNORMAL LOW (ref 12.0–15.0)
Lymphocytes Relative: 21 % (ref 12–46)
Lymphs Abs: 2.6 10*3/uL (ref 0.7–4.0)
MCH: 22.9 pg — ABNORMAL LOW (ref 26.0–34.0)
MCHC: 31.5 g/dL (ref 30.0–36.0)
MCV: 72.6 fL — ABNORMAL LOW (ref 78.0–100.0)
Monocytes Absolute: 1.1 10*3/uL — ABNORMAL HIGH (ref 0.1–1.0)
Monocytes Relative: 9 % (ref 3–12)
Neutro Abs: 8.4 10*3/uL — ABNORMAL HIGH (ref 1.7–7.7)
Neutrophils Relative %: 69 % (ref 43–77)
Platelets: 332 10*3/uL (ref 150–400)
RBC: 4.81 MIL/uL (ref 3.87–5.11)
RDW: 17.6 % — ABNORMAL HIGH (ref 11.5–15.5)
WBC: 12.2 10*3/uL — ABNORMAL HIGH (ref 4.0–10.5)

## 2013-01-01 LAB — URINALYSIS, ROUTINE W REFLEX MICROSCOPIC
Bilirubin Urine: NEGATIVE
Glucose, UA: NEGATIVE mg/dL
Hgb urine dipstick: NEGATIVE
Ketones, ur: 15 mg/dL — AB
Nitrite: NEGATIVE
Protein, ur: 30 mg/dL — AB
Specific Gravity, Urine: 1.039 — ABNORMAL HIGH (ref 1.005–1.030)
Urobilinogen, UA: 0.2 mg/dL (ref 0.0–1.0)
pH: 6 (ref 5.0–8.0)

## 2013-01-01 LAB — COMPREHENSIVE METABOLIC PANEL
ALT: 9 U/L (ref 0–35)
AST: 14 U/L (ref 0–37)
Albumin: 4.1 g/dL (ref 3.5–5.2)
Alkaline Phosphatase: 95 U/L (ref 39–117)
BUN: 7 mg/dL (ref 6–23)
CO2: 28 mEq/L (ref 19–32)
Calcium: 9.6 mg/dL (ref 8.4–10.5)
Chloride: 101 mEq/L (ref 96–112)
Creatinine, Ser: 0.8 mg/dL (ref 0.50–1.10)
GFR calc Af Amer: >90 mL/min (ref >90)
GFR calc non Af Amer: >90 mL/min (ref >90)
Glucose, Bld: 85 mg/dL (ref 70–99)
Potassium: 3.3 mEq/L — ABNORMAL LOW (ref 3.5–5.1)
Sodium: 138 mEq/L (ref 135–145)
Total Bilirubin: 0.4 mg/dL (ref 0.3–1.2)
Total Protein: 8.2 g/dL (ref 6.0–8.3)

## 2013-01-01 LAB — POCT PREGNANCY, URINE: Preg Test, Ur: NEGATIVE

## 2013-01-01 LAB — WET PREP, GENITAL
Trich, Wet Prep: NONE SEEN
Yeast Wet Prep HPF POC: NONE SEEN

## 2013-01-01 LAB — URINE MICROSCOPIC-ADD ON

## 2013-01-01 LAB — LIPASE, BLOOD: Lipase: 15 U/L (ref 11–59)

## 2013-01-01 MED ORDER — AZITHROMYCIN 1 G PO PACK
1.0000 g | PACK | Freq: Once | ORAL | Status: AC
  Administered 2013-01-01: 1 g via ORAL
  Filled 2013-01-01: qty 1

## 2013-01-01 MED ORDER — ONDANSETRON HCL 4 MG/2ML IJ SOLN
4.0000 mg | Freq: Once | INTRAMUSCULAR | Status: AC
  Administered 2013-01-01: 4 mg via INTRAVENOUS
  Filled 2013-01-01: qty 2

## 2013-01-01 MED ORDER — MORPHINE SULFATE 4 MG/ML IJ SOLN: 4.0000 mg | INTRAMUSCULAR | Status: DC | PRN

## 2013-01-01 MED ORDER — CEFTRIAXONE SODIUM 250 MG IJ SOLR
250.0000 mg | Freq: Once | INTRAMUSCULAR | Status: AC
  Administered 2013-01-01: 250 mg via INTRAMUSCULAR
  Filled 2013-01-01: qty 250

## 2013-01-01 MED ORDER — ONDANSETRON HCL 4 MG PO TABS: 4.0000 mg | ORAL_TABLET | Freq: Three times a day (TID) | ORAL | Status: DC | PRN

## 2013-01-01 MED ORDER — METRONIDAZOLE 500 MG PO TABS
2000.0000 mg | ORAL_TABLET | Freq: Once | ORAL | Status: AC
  Administered 2013-01-01: 2000 mg via ORAL
  Filled 2013-01-01: qty 1
  Filled 2013-01-01: qty 3

## 2013-01-01 MED ORDER — IOHEXOL 300 MG/ML  SOLN
25.0000 mL | INTRAMUSCULAR | Status: AC
  Administered 2013-01-01 (×2): 25 mL via ORAL

## 2013-01-01 MED ORDER — OXYCODONE-ACETAMINOPHEN 5-325 MG PO TABS: ORAL_TABLET | ORAL | Status: DC

## 2013-01-01 MED ORDER — ONDANSETRON HCL 4 MG/2ML IJ SOLN: 4.0000 mg | Freq: Once | INTRAMUSCULAR | Status: DC

## 2013-01-01 MED ORDER — DEXTROSE 5 % IV SOLN: 250.0000 mg | Freq: Once | INTRAVENOUS | Status: DC

## 2013-01-01 MED ORDER — LIDOCAINE HCL (PF) 1 % IJ SOLN
INTRAMUSCULAR | Status: AC
  Administered 2013-01-01: 1.2 mL
  Filled 2013-01-01: qty 5

## 2013-01-01 MED ORDER — IOHEXOL 300 MG/ML  SOLN
100.0000 mL | Freq: Once | INTRAMUSCULAR | Status: AC | PRN
  Administered 2013-01-01: 100 mL via INTRAVENOUS

## 2013-01-01 MED ORDER — MORPHINE SULFATE 4 MG/ML IJ SOLN
6.0000 mg | Freq: Once | INTRAMUSCULAR | Status: AC
  Administered 2013-01-01: 6 mg via INTRAVENOUS
  Filled 2013-01-01: qty 2

## 2013-01-01 NOTE — ED Notes (Signed)
Notified CT of pt completion of contrast.

## 2013-01-01 NOTE — ED Provider Notes (Addendum)
History     CSN: [id number]  Arrival date & time 01/01/13  1204   First MD Initiated Contact with Patient 01/01/13 1247      Chief Complaint  Patient presents with  . Abdominal Pain    (Consider location/radiation/quality/duration/timing/severity/associated sxs/prior treatment) HPI  Peggy Schmidt is a 32 y.o. female complaining of severe suprapubic pain starting yesterday. Patient then developed nausea and vomiting. She took Dulcolax tabs for constipation last bowel movement was several weeks ago this is not unusual for her. She rates her pain as severe 10 out of 10 last menstrual period was in 12 of 2013. Patient denies chest pain, cough, fever, vaginal discharge, dysuria. Patient endorses urinary hesitancy.    Past Medical History  Diagnosis Date  . Asthma     Past Surgical History  Procedure Date  . Ankle fracture surgery     No family history on file.  History  Substance Use Topics  . Smoking status: Never Smoker   . Smokeless tobacco: Not on file  . Alcohol Use: No    OB History    Grav Para Term Preterm Abortions TAB SAB Ect Mult Living                  Review of Systems  Constitutional: Negative for fever.  Respiratory: Negative for shortness of breath.   Cardiovascular: Negative for chest pain.  Gastrointestinal: Positive for nausea and vomiting. Negative for diarrhea.  Genitourinary: Positive for decreased urine volume.  All other systems reviewed and are negative.    Allergies  Review of patient's allergies indicates no known allergies.  Home Medications   Current Outpatient Rx  Name  Route  Sig  Dispense  Refill  . ACETAMINOPHEN 500 MG PO TABS   Oral   Take 1 tablet (500 mg total) by mouth every 6 (six) hours as needed for pain.   30 tablet   0   . GUAIFENESIN 100 MG/5ML PO SYRP   Oral   Take 400 mg by mouth 3 (three) times daily as needed. For cough         . LORATADINE 10 MG PO TABS   Oral   Take 10 mg by mouth daily.           BP 129/85  Pulse 98  Temp 98 F (36.7 C)  Resp 18  SpO2 100%  Physical Exam  Nursing note and vitals reviewed. Constitutional: She is oriented to person, place, and time. She appears well-developed and well-nourished. No distress.  HENT:  Head: Normocephalic.  Mouth/Throat: Oropharynx is clear and moist.  Eyes: Conjunctivae normal and EOM are normal.  Cardiovascular: Normal rate.   Pulmonary/Chest: Effort normal. No stridor.  Abdominal: Soft. Bowel sounds are normal. She exhibits no distension and no mass. There is tenderness. There is no rebound and no guarding.       Patient is tender to moderate palpation of the suprapubic area and right lower quadrant.  Genitourinary:       No CVA tenderness bilaterally.  Pelvic exam chaperoned by technician. Thick white vaginal discharge. No cervical motion or adnexal tenderness.  Musculoskeletal: Normal range of motion.  Neurological: She is alert and oriented to person, place, and time.  Psychiatric: She has a normal mood and affect.    ED Course  Procedures (including critical care time)  Labs Reviewed  CBC WITH DIFFERENTIAL - Abnormal; Notable for the following:    WBC 12.2 (*)     Hemoglobin 11.0 (*)  HCT 34.9 (*)     MCV 72.6 (*)     MCH 22.9 (*)     RDW 17.6 (*)     Neutro Abs 8.4 (*)     Monocytes Absolute 1.1 (*)     All other components within normal limits  COMPREHENSIVE METABOLIC PANEL - Abnormal; Notable for the following:    Potassium 3.3 (*)     All other components within normal limits  URINALYSIS, ROUTINE W REFLEX MICROSCOPIC - Abnormal; Notable for the following:    Color, Urine AMBER (*)  BIOCHEMICALS MAY BE AFFECTED BY COLOR   APPearance CLOUDY (*)     Specific Gravity, Urine 1.039 (*)     Ketones, ur 15 (*)     Protein, ur 30 (*)     Leukocytes, UA SMALL (*)     All other components within normal limits  WET PREP, GENITAL - Abnormal; Notable for the following:    Clue Cells Wet Prep HPF POC FEW  (*)     WBC, Wet Prep HPF POC MANY (*)     All other components within normal limits  URINE MICROSCOPIC-ADD ON - Abnormal; Notable for the following:    Squamous Epithelial / LPF FEW (*)     Bacteria, UA FEW (*)     All other components within normal limits  LIPASE, BLOOD  POCT PREGNANCY, URINE  GC/CHLAMYDIA PROBE AMP  URINE CULTURE   Ct Abdomen Pelvis W Contrast  01/01/2013  *RADIOLOGY REPORT*  Clinical Data: Abdominal pain, nausea, vomiting.  CT ABDOMEN AND PELVIS WITH CONTRAST  Technique:  Multidetector CT imaging of the abdomen and pelvis was performed following the standard protocol during bolus administration of intravenous contrast.  Contrast: OMNIPAQUE IOHEXOL 300 MG/ML  SOLN  Comparison: Plain films 09/18/2010  Findings: Lung bases are clear.  No effusions.  Heart is normal size.  Liver, gallbladder, spleen, pancreas, stomach, adrenals are normal. Left mid pole renal stones.  No ureteral stones bilaterally.  No hydronephrosis.  Appendix is visualized and is normal. Bowel grossly unremarkable. No free fluid, free air, or adenopathy.  Large low density area within the anterior aspect of the uterus measuring up to 5.5 cm. This presumably represents cystic degeneration of a fibroid.  No free fluid or free air.  No acute bony abnormality.  IMPRESSION: 5.5 cm low density area centrally within the anterior uterus, presumably cystic degeneration of a fibroid.  This can be further characterized with pelvic ultrasound or MRI if felt clinically indicated.  Appendix is normal.  Left nephrolithiasis.  No ureteral stones or hydronephrosis.   Original Report Authenticated By: Charlett Nose, M.D.      1. Abdominal pain   2. Nausea and vomiting   3. Bacterial vaginosis       MDM  Patient is tender in the right lower quadrant. Concern for appendicitis. CT abdomen pelvis to rule out appy.  Patient is a mild leukocytosis of 12.2. UA,CMP are all unremarkable.  Wet prep shows clue cells and many  white blood cells. Discusses the patient I will treat her for gonorrhea Chlamydia and bacterial vaginosis  Patient signed out to PA Muthersbaugh at shift change. Plan is to d/c home with pain and nausea medication the CT is negative.      Wynetta Emery, PA-C 01/01/13 1544  CT shows a normal appendix and a abnormality in the anterior uterus consistent with a cystic degeneration of a fibroid.  Patient will be encouraged to followup with GYN.  Pt verbalized understanding and agrees with care plan. Outpatient follow-up and return precautions given.    New Prescriptions   ONDANSETRON (ZOFRAN) 4 MG TABLET    Take 1 tablet (4 mg total) by mouth every 8 (eight) hours as needed for nausea.   OXYCODONE-ACETAMINOPHEN (PERCOCET/ROXICET) 5-325 MG PER TABLET    1 to 2 tabs PO q6hrs  PRN for pain      Wynetta Emery, PA-C 01/01/13 1639

## 2013-01-01 NOTE — ED Notes (Signed)
Pt presents to department for evaluation of lower abdominal pain, nausea and vomiting. Onset last night while at home. Took laxative with no relief. 9/10 pain at the time. Lower abdomen tender to palpation. Bowel sounds present all quadrants. Denies vaginal discharge. Denies vaginal bleeding. She is alert and oriented x4. Skin warm and dry.

## 2013-01-01 NOTE — ED Notes (Signed)
Pt is here with lower abdominal pain since last nite.  Pt took dulcolax tabs for constipation, patient states started vomiting this am with minimal results from laxative.  No vaginal discharge or bleeding.  LMP- Dec 2013.  No urinary symptoms

## 2013-01-01 NOTE — ED Provider Notes (Signed)
Medical screening examination/treatment/procedure(s) were performed by non-physician practitioner and as supervising physician I was immediately available for consultation/collaboration.   Dione Booze, MD 01/01/13 [phone]

## 2013-01-01 NOTE — ED Provider Notes (Signed)
Medical screening examination/treatment/procedure(s) were performed by non-physician practitioner and as supervising physician I was immediately available for consultation/collaboration.    Jon R Knapp, MD 01/01/13 1615 

## 2013-01-01 NOTE — ED Notes (Signed)
Pt aware of need of urine specimen; pt cannot go at this time but will attempt later

## 2013-01-01 NOTE — ED Provider Notes (Signed)
Care Assumed from [facility], PA-C.    S: 32 y.o. with severe abdominal pain with associated N/V beginning yesterday  PE/labs: RLQ tenderness, pelvic exam unremarkable  DDX: concern for appendicitis  Plan: CT to r/o appy, will d/c home with symptomatic therapy if negative  4:22 PM CT abd - 5.5 cm low density area centrally within the anterior uterus, presumably cystic degeneration of a fibroid. Appendix is normal. Left nephrolithiasis. No ureteral stones or hydronephrosis  Will D/c home with symptomatic therapy.     Dierdre Forth, PA-C 01/01/13 1950

## 2013-01-01 NOTE — ED Notes (Signed)
Pt states that she is pain free at the present.

## 2013-01-01 NOTE — ED Notes (Signed)
Family at bedside. 

## 2013-01-01 NOTE — ED Notes (Signed)
PA at the bedside for pelvic.

## 2013-01-02 LAB — URINE CULTURE: Colony Count: 5000

## 2013-01-02 LAB — GC/CHLAMYDIA PROBE AMP
CT Probe RNA: NEGATIVE
GC Probe RNA: NEGATIVE

## 2013-01-02 NOTE — ED Provider Notes (Signed)
Medical screening examination/treatment/procedure(s) were performed by non-physician practitioner and as supervising physician I was immediately available for consultation/collaboration.    Celene Kras, MD 01/02/13 [phone]

## 2013-01-12 ENCOUNTER — Emergency Department (HOSPITAL_COMMUNITY): Payer: Worker's Compensation

## 2013-01-12 ENCOUNTER — Encounter (HOSPITAL_COMMUNITY): Payer: Self-pay | Admitting: *Deleted

## 2013-01-12 ENCOUNTER — Emergency Department (HOSPITAL_COMMUNITY)
Admission: EM | Admit: 2013-01-12 | Discharge: 2013-01-12 | Disposition: A | Discharge status: Home / Self Care | Payer: Worker's Compensation | Attending: Emergency Medicine | Admitting: Emergency Medicine

## 2013-01-12 DIAGNOSIS — Y9289 Other specified places as the place of occurrence of the external cause: Secondary | ICD-10-CM | POA: Insufficient documentation

## 2013-01-12 DIAGNOSIS — S86819A Strain of other muscle(s) and tendon(s) at lower leg level, unspecified leg, initial encounter: Secondary | ICD-10-CM | POA: Insufficient documentation

## 2013-01-12 DIAGNOSIS — W010XXA Fall on same level from slipping, tripping and stumbling without subsequent striking against object, initial encounter: Secondary | ICD-10-CM | POA: Insufficient documentation

## 2013-01-12 DIAGNOSIS — S8391XA Sprain of unspecified site of right knee, initial encounter: Secondary | ICD-10-CM

## 2013-01-12 DIAGNOSIS — Y939 Activity, unspecified: Secondary | ICD-10-CM | POA: Insufficient documentation

## 2013-01-12 DIAGNOSIS — S838X9A Sprain of other specified parts of unspecified knee, initial encounter: Secondary | ICD-10-CM | POA: Insufficient documentation

## 2013-01-12 DIAGNOSIS — J45909 Unspecified asthma, uncomplicated: Secondary | ICD-10-CM | POA: Insufficient documentation

## 2013-01-12 MED ORDER — IBUPROFEN 800 MG PO TABS: 800.0000 mg | ORAL_TABLET | Freq: Three times a day (TID) | ORAL | Status: DC

## 2013-01-12 MED ORDER — HYDROCODONE-ACETAMINOPHEN 5-325 MG PO TABS
1.0000 | ORAL_TABLET | Freq: Once | ORAL | Status: AC
  Administered 2013-01-12: 1 via ORAL
  Filled 2013-01-12: qty 1

## 2013-01-12 MED ORDER — HYDROCODONE-ACETAMINOPHEN 5-325 MG PO TABS: 1.0000 | ORAL_TABLET | ORAL | Status: DC | PRN

## 2013-01-12 NOTE — ED Notes (Signed)
Pt given discharge paperwork; pt verbalized understanding of discharge with no additional questions; e-signature obtained; 

## 2013-01-12 NOTE — ED Notes (Signed)
Pt fell this am at work and has pain in right knee, pt ambulatory

## 2013-01-12 NOTE — ED Provider Notes (Signed)
History     CSN: [id number]  Arrival date & time 01/12/13  1857   First MD Initiated Contact with Patient 01/12/13 2006      Chief Complaint  Patient presents with  . Knee Injury    (Consider location/radiation/quality/duration/timing/severity/associated sxs/prior treatment) HPI History provided by pt.   Pt slipped on greasy floor at work this evening and fell, landing on her right side.  Did not hit head and denies pain in neck/back.  C/o pain in right knee only, particularly medial aspect.  Aggravated by bearing weight and flexion.  No associated paresthesias.  Past Medical History  Diagnosis Date  . Asthma     Past Surgical History  Procedure Date  . Ankle fracture surgery     No family history on file.  History  Substance Use Topics  . Smoking status: Never Smoker   . Smokeless tobacco: Not on file  . Alcohol Use: No    OB History    Grav Para Term Preterm Abortions TAB SAB Ect Mult Living                  Review of Systems  All other systems reviewed and are negative.    Allergies  Peanut-containing drug products  Home Medications   Current Outpatient Rx  Name  Route  Sig  Dispense  Refill  . HYDROCODONE-ACETAMINOPHEN 5-325 MG PO TABS   Oral   Take 1 tablet by mouth every 4 (four) hours as needed for pain.   12 tablet   0   . IBUPROFEN 800 MG PO TABS   Oral   Take 1 tablet (800 mg total) by mouth 3 (three) times daily.   12 tablet   0     BP 153/97  Pulse 94  Temp 98.7 F (37.1 C)  Resp 16  SpO2 100%  LMP 01/08/2013  Physical Exam  Nursing note and vitals reviewed. Constitutional: She is oriented to person, place, and time. She appears well-developed and well-nourished. No distress.  HENT:  Head: Normocephalic and atraumatic.  Eyes:       Normal appearance  Neck: Normal range of motion.  Pulmonary/Chest: Effort normal.  Musculoskeletal: Normal range of motion.       Right knee w/ mild edema and ecchymosis anteriorly.   Tenderness anteromedial knee.  Pain w/ minimal passive flexion.  No laxity w/ varus/valgus stress.  2+ DP pulse and distal sensation intact.    Neurological: She is alert and oriented to person, place, and time.  Psychiatric: She has a normal mood and affect. Her behavior is normal.    ED Course  Procedures (including critical care time)  Labs Reviewed - No data to display Dg Knee Complete 4 Views Right  01/12/2013  *RADIOLOGY REPORT*  Clinical Data: 32 year old female status post fall and twisting injury.  Swelling and pain.  RIGHT KNEE - COMPLETE 4+ VIEW  Comparison: None.  Findings: Small moderate suprapatellar joint effusion.  Patella appears intact.  Joint spaces preserved. Bone mineralization is within normal limits.  No acute fracture identified.  IMPRESSION: Joint effusion with no acute fracture identified at the right knee.   Original Report Authenticated By: Erskine Speed, M.D.      1. Sprain of right knee       MDM  32yo F presents w/ right knee pain, s/p mechanical fall at work.  Xray knee neg for fx/dislocation and NV intact on exam.  Will treat symptomatically for sprain.  Ortho tech placed in  knee immobilizer and provided her with crutches.  Pt received one dose of vicodin and d/c'd home w/ 12 more + 800mg  ibuprofen.  Recommended RICE and referred to ortho for persistent/worsening sx.          Otilio Miu, PA-C 01/12/13 2022

## 2013-01-12 NOTE — Progress Notes (Signed)
Orthopedic Tech Progress Note Patient Details:  Peggy Schmidt 03-18-1981 [id number]  Ortho Devices Type of Ortho Device: Crutches;Knee Immobilizer Ortho Device/Splint Location: (R) LE Ortho Device/Splint Interventions: Application   Jennye Moccasin 01/12/2013, 8:37 PM

## 2013-01-13 NOTE — ED Provider Notes (Signed)
Medical screening examination/treatment/procedure(s) were performed by non-physician practitioner and as supervising physician I was immediately available for consultation/collaboration.  Milian Skene, M.D.     Tolson Skene, MD 01/13/13 [phone]

## 2013-01-17 ENCOUNTER — Encounter: Payer: Self-pay | Admitting: Obstetrics & Gynecology

## 2013-02-13 ENCOUNTER — Encounter (HOSPITAL_COMMUNITY): Payer: Self-pay | Admitting: Emergency Medicine

## 2013-02-13 DIAGNOSIS — J45909 Unspecified asthma, uncomplicated: Secondary | ICD-10-CM | POA: Insufficient documentation

## 2013-02-13 DIAGNOSIS — H6690 Otitis media, unspecified, unspecified ear: Secondary | ICD-10-CM | POA: Insufficient documentation

## 2013-02-13 DIAGNOSIS — R51 Headache: Secondary | ICD-10-CM | POA: Insufficient documentation

## 2013-02-13 NOTE — ED Notes (Signed)
Patient complaining of right sided ear ache for the last week; patient reports that she has a hole in a tooth on the right side.  Patient denies facial swelling.

## 2013-02-14 ENCOUNTER — Emergency Department (HOSPITAL_COMMUNITY)
Admission: EM | Admit: 2013-02-14 | Discharge: 2013-02-14 | Disposition: A | Discharge status: Home / Self Care | Payer: Self-pay | Attending: Emergency Medicine | Admitting: Emergency Medicine

## 2013-02-14 MED ORDER — AMOXICILLIN 250 MG PO CAPS: 500.0000 mg | ORAL_CAPSULE | Freq: Two times a day (BID) | ORAL | Status: DC

## 2013-02-14 MED ORDER — OXYCODONE-ACETAMINOPHEN 5-325 MG PO TABS
1.0000 | ORAL_TABLET | Freq: Once | ORAL | Status: AC
  Administered 2013-02-14: 1 via ORAL
  Filled 2013-02-14: qty 1

## 2013-02-14 MED ORDER — TRAMADOL HCL 50 MG PO TABS: 50.0000 mg | ORAL_TABLET | Freq: Four times a day (QID) | ORAL | Status: DC | PRN

## 2013-02-14 NOTE — ED Provider Notes (Signed)
History     CSN: [id number]  Arrival date & time 02/13/13  2259   First MD Initiated Contact with Patient 02/14/13 0111      Chief Complaint  Patient presents with  . Otalgia    (Consider location/radiation/quality/duration/timing/severity/associated sxs/prior treatment) HPI Comments: 32 yo AA female with past medical history of asthma presents to the ED today with right ear pain beginning one week ago.  Pain has been constant and throbbing since onset.  Patient tried OTC ES Tylenol with no relief.  Lying on right side makes it worse.  She reports previous ear infections but says "It has been many years since I have had one."  Pain 8/10.  Reports associated headache on right side and having a "hole in a tooth on the right side."  Denies fever, chills, nausea, vomiting, cough, sore throat, sinus tenderness, congestion, rhinorrhea, sneezing, tinnitis, or dizziness.    Patient is a 32 y.o. female presenting with ear pain. The history is provided by the patient.  Otalgia Location:  Right Behind ear:  No abnormality Quality:  Throbbing Severity:  Severe Onset quality:  Sudden Timing:  Constant Progression:  Unchanged Chronicity:  New Context: not direct blow, not elevation change, not foreign body in ear, not loud noise and no water in ear   Relieved by:  Nothing Worsened by:  Position Ineffective treatments:  OTC medications Associated symptoms: headaches   Associated symptoms: no abdominal pain, no congestion, no cough, no diarrhea, no ear discharge, no fever, no hearing loss, no neck pain, no rash, no rhinorrhea, no sore throat, no tinnitus and no vomiting     Past Medical History  Diagnosis Date  . Asthma     Past Surgical History  Procedure Laterality Date  . Ankle fracture surgery      History reviewed. No pertinent family history.  History  Substance Use Topics  . Smoking status: Never Smoker   . Smokeless tobacco: Not on file  . Alcohol Use: No    OB History    Grav Para Term Preterm Abortions TAB SAB Ect Mult Living                  Review of Systems  Constitutional: Negative for fever.  HENT: Positive for ear pain. Negative for hearing loss, congestion, sore throat, rhinorrhea, neck pain, tinnitus and ear discharge.   Respiratory: Negative for cough.   Gastrointestinal: Negative for vomiting, abdominal pain and diarrhea.  Skin: Negative for rash.  Neurological: Positive for headaches.    Allergies  Peanut-containing drug products  Home Medications   Current Outpatient Rx  Name  Route  Sig  Dispense  Refill  . HYDROcodone-acetaminophen (NORCO/VICODIN) 5-325 MG per tablet   Oral   Take 1 tablet by mouth every 4 (four) hours as needed for pain.   12 tablet   0   . ibuprofen (ADVIL,MOTRIN) 800 MG tablet   Oral   Take 1 tablet (800 mg total) by mouth 3 (three) times daily.   12 tablet   0     BP 145/102  Pulse 84  Temp(Src) 98.5 F (36.9 C) (Oral)  Resp 18  SpO2 100%  LMP 02/08/2013  Physical Exam  Constitutional: She appears well-developed and well-nourished. No distress.  HENT:  Head: Normocephalic.  Right Ear: Hearing, external ear and ear canal normal. No drainage or tenderness. No mastoid tenderness. Tympanic membrane is not perforated and not erythematous. A middle ear effusion is present.  Left Ear: Hearing, tympanic membrane,  external ear and ear canal normal.  Nose: Nose normal.  Mouth/Throat: Oropharynx is clear and moist. No oropharyngeal exudate.  Jaw clicking and right TMJ ttp  Eyes: Conjunctivae are normal. Pupils are equal, round, and reactive to light.  Neck: Neck supple. No tracheal deviation present. No thyromegaly present.  Cardiovascular: Normal rate, regular rhythm, normal heart sounds and intact distal pulses.  Exam reveals no gallop and no friction rub.   No murmur heard. Pulmonary/Chest: Effort normal and breath sounds normal. No respiratory distress. She has no wheezes. She has no rales. She  exhibits no tenderness.  Lymphadenopathy:    She has no cervical adenopathy.  Skin: She is not diaphoretic.    ED Course  Procedures (including critical care time)  Labs Reviewed - No data to display No results found.   No diagnosis found.    MDM  OM vs TMJ pain  Patient presents with otalgia and exam consistent with acute otitis media. No concern for acute mastoiditis, meningitis. No antibiotic use in the last month.  Patient discharged home with Amoxicillin.   Advised parents to call pediatrician today for follow-up.  I have also discussed reasons to return immediately to the ER.  Parent expresses understanding and agrees with plan.           Jaci Carrel, [place] 02/14/13 [phone]

## 2013-02-14 NOTE — ED Notes (Signed)
Pt dc'd home w/2 rx, driven home by friend, pt alert and ambulatory upon d/c, pt d/c w/all belongings, pt verbalizes understanding of dc instructions

## 2013-02-14 NOTE — ED Provider Notes (Signed)
Medical screening examination/treatment/procedure(s) were performed by non-physician practitioner and as supervising physician I was immediately available for consultation/collaboration.  Sunnie Nielsen, MD 02/14/13 [phone]

## 2013-03-26 ENCOUNTER — Emergency Department (HOSPITAL_COMMUNITY)
Admission: EM | Admit: 2013-03-26 | Discharge: 2013-03-26 | Disposition: A | Discharge status: Home Health | Payer: Self-pay | Attending: Emergency Medicine | Admitting: Emergency Medicine

## 2013-03-26 ENCOUNTER — Encounter (HOSPITAL_COMMUNITY): Payer: Self-pay | Admitting: Emergency Medicine

## 2013-03-26 DIAGNOSIS — K0889 Other specified disorders of teeth and supporting structures: Secondary | ICD-10-CM

## 2013-03-26 DIAGNOSIS — J45909 Unspecified asthma, uncomplicated: Secondary | ICD-10-CM | POA: Insufficient documentation

## 2013-03-26 DIAGNOSIS — K089 Disorder of teeth and supporting structures, unspecified: Secondary | ICD-10-CM | POA: Insufficient documentation

## 2013-03-26 MED ORDER — HYDROCODONE-ACETAMINOPHEN 5-325 MG PO TABS: 1.0000 | ORAL_TABLET | Freq: Four times a day (QID) | ORAL | Status: DC | PRN

## 2013-03-26 MED ORDER — PENICILLIN V POTASSIUM 500 MG PO TABS: 500.0000 mg | ORAL_TABLET | Freq: Four times a day (QID) | ORAL | Status: DC

## 2013-03-26 NOTE — ED Provider Notes (Signed)
History    This chart was scribed for non-physician practitioner Jaci Carrel, PA-C working with Raeford Razor, MD by Gerlean Ren, ED Scribe. This patient was seen in room TR07C/TR07C and the patient's care was started at 7:57 PM.   CSN: [id number]  Arrival date & time 03/26/13  1855   None     Chief Complaint  Patient presents with  . Dental Pain     The history is provided by the patient. No language interpreter was used.  Peggy Schmidt is a 32 y.o. female who presents to the Emergency Department complaining of constant right lower dental pain gradually worsening over the past 2 weeks and radiating into right lower jaw.  Pt denies difficulty swallowing.  Pt was seen here 02/26 for similar pain and was treated with antibiotics that she states she took as prescribed with no improvements.     Past Medical History  Diagnosis Date  . Asthma     Past Surgical History  Procedure Laterality Date  . Ankle fracture surgery      No family history on file.  History  Substance Use Topics  . Smoking status: Never Smoker   . Smokeless tobacco: Not on file  . Alcohol Use: No    No OB history provided.   Review of Systems  HENT: Positive for dental problem. Negative for trouble swallowing.     Allergies  Peanut-containing drug products  Home Medications   Current Outpatient Rx  Name  Route  Sig  Dispense  Refill  . HYDROcodone-acetaminophen (NORCO/VICODIN) 5-325 MG per tablet   Oral   Take 1 tablet by mouth every 4 (four) hours as needed for pain.         Marland Kitchen oxyCODONE-acetaminophen (PERCOCET/ROXICET) 5-325 MG per tablet   Oral   Take 1 tablet by mouth every 4 (four) hours as needed for pain.           BP 146/92  Pulse 82  Temp(Src) 98.7 F (37.1 C) (Oral)  Resp 16  Ht 5\' 5"  (1.651 m)  SpO2 96%  Physical Exam  Nursing note and vitals reviewed. Constitutional: She is oriented to person, place, and time. She appears well-developed and well-nourished. No distress.   HENT:  Head: Normocephalic and atraumatic. No trismus in the jaw.  Mouth/Throat: Uvula is midline, oropharynx is clear and moist and mucous membranes are normal. Abnormal dentition. No dental abscesses or edematous. No oropharyngeal exudate, posterior oropharyngeal edema, posterior oropharyngeal erythema or tonsillar abscesses.    Mild right sided facial swelling. Pt able to open and close mouth with out difficulty. Airway intact. Uvula midline. Mild gingival swelling with tenderness over affected area, but no fluctuance. No swelling or tenderness of submental and submandibular regions.  Eyes: Conjunctivae and EOM are normal.  Neck: Normal range of motion and full passive range of motion without pain. Neck supple.  Cardiovascular: Normal rate and regular rhythm.   Pulmonary/Chest: Effort normal and breath sounds normal. No stridor. No respiratory distress. She has no wheezes.  Musculoskeletal: Normal range of motion.  Lymphadenopathy:       Head (right side): No submental, no submandibular, no tonsillar, no preauricular and no posterior auricular adenopathy present.       Head (left side): No submental, no submandibular, no tonsillar, no preauricular and no posterior auricular adenopathy present.    She has no cervical adenopathy.  Neurological: She is alert and oriented to person, place, and time.  Skin: Skin is warm and dry. No rash  noted. She is not diaphoretic.    ED Course  Procedures (including critical care time) DIAGNOSTIC STUDIES: Oxygen Saturation is 96% on room air, adequate by my interpretation.    COORDINATION OF CARE: 8:01 PM- Informed pt that follow-up with dentist is absolutely necessary to prevent further spreading of infection.  Pt understands.    No diagnosis found.    MDM  Partial molar avulsion  Patient with toothache.  No gross abscess.  Exam unconcerning for Ludwig's angina or spread of infection.  Will treat with penicillin and pain medicine.  Urged patient  to follow-up with dentist.  Informed pt that there are payment plans in place if pt calls referred ED dentist in next 24-48 hours.  Provided pt with [employer] information.  Pt verbalizes understanding.       I personally performed the services described in this documentation, which was scribed in my presence. The recorded information has been reviewed and is accurate.      Jaci Carrel, [place] 03/26/13 2019

## 2013-03-26 NOTE — ED Notes (Signed)
Onset 2 weeks ago dental pain right lower jaw states pain worsening overtime radiating to right side of face. Airway intact bilateral equal chest rise and fall.

## 2013-03-30 NOTE — ED Provider Notes (Signed)
Medical screening examination/treatment/procedure(s) were performed by non-physician practitioner and as supervising physician I was immediately available for consultation/collaboration.  Raeford Razor, MD 03/30/13 1419

## 2013-06-07 ENCOUNTER — Encounter (HOSPITAL_COMMUNITY): Payer: Self-pay | Admitting: Emergency Medicine

## 2013-06-07 ENCOUNTER — Emergency Department (HOSPITAL_COMMUNITY)
Admission: EM | Admit: 2013-06-07 | Discharge: 2013-06-07 | Disposition: A | Discharge status: Home / Self Care | Payer: Self-pay | Attending: Emergency Medicine | Admitting: Emergency Medicine

## 2013-06-07 DIAGNOSIS — R197 Diarrhea, unspecified: Secondary | ICD-10-CM | POA: Insufficient documentation

## 2013-06-07 DIAGNOSIS — J45909 Unspecified asthma, uncomplicated: Secondary | ICD-10-CM | POA: Insufficient documentation

## 2013-06-07 MED ORDER — LOPERAMIDE HCL 2 MG PO CAPS: 2.0000 mg | ORAL_CAPSULE | Freq: Four times a day (QID) | ORAL | Status: DC | PRN

## 2013-06-07 NOTE — ED Notes (Signed)
Pt. Stated, 2 days ago I started having vomiting and diarrhea, and its gone away for the most part, but I'm still having some diarrhea and want to be checked out.

## 2013-06-07 NOTE — ED Provider Notes (Signed)
  Medical screening examination/treatment/procedure(s) were performed by non-physician practitioner and as supervising physician I was immediately available for consultation/collaboration.    Robert Lockwood, MD 06/07/13 1907 

## 2013-06-07 NOTE — ED Provider Notes (Signed)
History     CSN: [id number]  Arrival date & time 06/07/13  1452   First MD Initiated Contact with Patient 06/07/13 1608      Chief Complaint  Patient presents with  . Diarrhea    (Consider location/radiation/quality/duration/timing/severity/associated sxs/prior treatment) HPI Comments: Patient presents with a chief complaint of diarrhea.  She reports that two days ago she began having nausea, vomiting, and diarrhea.  Her nausea and vomiting has resolved.  However, she continues to have diarrhea.  She denies any episodes of vomiting today, but reports that she has had 2-3 episodes of loose stool  She denies any blood in her stool.  She denies fever or chills.  Denies abdominal pain.  Denies any recent foreign travel, antibiotic use, or hospitalizations.  She has not taken anything for her symptoms prior to arrival.  The history is provided by the patient.    Past Medical History  Diagnosis Date  . Asthma     Past Surgical History  Procedure Laterality Date  . Ankle fracture surgery      No family history on file.  History  Substance Use Topics  . Smoking status: Never Smoker   . Smokeless tobacco: Not on file  . Alcohol Use: No    OB History   Grav Para Term Preterm Abortions TAB SAB Ect Mult Living                  Review of Systems  All other systems reviewed and are negative.    Allergies  Peanut-containing drug products  Home Medications  No current outpatient prescriptions on file.  BP 138/89  Pulse 78  Temp(Src) 98.2 F (36.8 C) (Oral)  Resp 14  LMP 05/22/2013  Physical Exam  Nursing note and vitals reviewed. Constitutional: She appears well-developed and well-nourished. No distress.  HENT:  Head: Normocephalic and atraumatic.  Cardiovascular: Normal rate, regular rhythm and normal heart sounds.   Pulmonary/Chest: Effort normal and breath sounds normal.  Abdominal: Soft. Bowel sounds are normal. She exhibits no distension and no mass. There  is no tenderness. There is no rebound and no guarding.  Genitourinary:  Patient declined rectal exam  Neurological: She is alert.  Skin: Skin is warm and dry. She is not diaphoretic.  Psychiatric: She has a normal mood and affect.    ED Course  Procedures (including critical care time)  Labs Reviewed - No data to display No results found.   No diagnosis found.    MDM  Patient presenting with diarrhea.  Reports that initially she had nausea and vomiting, but that has since resolved.   Patient afebrile.  No abdominal pain on exam.  Therefore, feel that the patient is stable for discharge.  Patient instructed to take Imodium as needed for diarrhea.  Return precautions given.        Pascal Lux [place], PA-C 06/07/13 1757

## 2014-10-28 ENCOUNTER — Emergency Department (HOSPITAL_COMMUNITY): Payer: Self-pay

## 2014-10-28 ENCOUNTER — Emergency Department (HOSPITAL_COMMUNITY)
Admission: EM | Admit: 2014-10-28 | Discharge: 2014-10-28 | Disposition: A | Discharge status: Home / Self Care | Payer: Self-pay | Attending: Emergency Medicine | Admitting: Emergency Medicine

## 2014-10-28 ENCOUNTER — Encounter (HOSPITAL_COMMUNITY): Payer: Self-pay | Admitting: Emergency Medicine

## 2014-10-28 DIAGNOSIS — J45909 Unspecified asthma, uncomplicated: Secondary | ICD-10-CM | POA: Insufficient documentation

## 2014-10-28 DIAGNOSIS — Z9889 Other specified postprocedural states: Secondary | ICD-10-CM | POA: Insufficient documentation

## 2014-10-28 DIAGNOSIS — R52 Pain, unspecified: Secondary | ICD-10-CM

## 2014-10-28 DIAGNOSIS — M79672 Pain in left foot: Secondary | ICD-10-CM | POA: Insufficient documentation

## 2014-10-28 MED ORDER — NAPROXEN 500 MG PO TABS: 500.0000 mg | ORAL_TABLET | Freq: Two times a day (BID) | ORAL | Status: DC

## 2014-10-28 NOTE — Discharge Instructions (Signed)
Bunion Care A bunion is a boney protrusion at the base of your big toe (metatarsal-phalangeal joint). This problem, if painful or troublesome can be corrected with surgery. This is an elective surgery, so you can pick a convenient time for the procedure. The surgery may:  Improve appearance (cosmetic).  Relieve pain.  Improve function. Your foot is made up of a complex set of twenty-six bones which are held together by tough fibrous ligaments. The movement of the foot is controlled by muscles in the foot and leg. These muscles attach to the foot by cord like structures (tendons) that attach muscle to bone. If surgery is recommended, your caregiver will explain your foot problem and how surgery can improve it. Your caregiver can answer questions you may have about the potential risks and complications involved. After determining that foot surgery is necessary to correct your problem, you can proceed with plans for the surgery. LET YOUR CAREGIVER KNOW ABOUT:   Previous problems with anesthetics or medicines used to numb the skin.  Allergies to dyes, iodine, foods, and/or latex.  Medicines taken including herbs, eye drops, prescription medicines (especially medicines used to "thin the blood"), aspirin and other over-the-counter medicines, and steroids (by mouth or as a cream).  History of bleeding or blood problems.  Possibility of pregnancy, if this applies.  History of blood clots in your legs and/or lungs.  Previous surgery.  Other important health problems. Let your caregiver know about health changes prior to surgery. BEFORE THE PROCEDURE  You should be present 60 minutes prior to your procedure or as directed.  PROCEDURE BUNION TYPES AND THEIR TREATMENTS  Positional Bunion. A positional bunion develops when a bony growth on the side of the metatarsal bone enlarges the joint. The metatarsal forces the joint capsule to stretch over it. As this growth pushes the big toe toward the  others, the tendons on the inside tighten. This forces the big toe farther out of alignment. The bunion presses against the shoe, irritating the skin and causes further pain and disability.  Positional Bunionectomy Treatment. The bunion is removed. Tight tendons may be released.  Follow-up Care. Your toe is apt to be stiff at first but will loosen up as you move it. You may need to wear a special surgical shoe and, possibly, a splint for about three weeks.  Mild Structural Bunion. Structural bunions occur when the angle between the first and second metatarsal bones increases to a point where it is greater than normal. The increased angle of the metatarsals makes the big toe slant toward the other toes. Sometimes bony growths may form. Irritation and swelling often follow.  Structural Bunionectomy Treatment. Your caregiver surgically repositions the bone by decreasing the angle and may use a fixation device to hold it together. The bunion (bump) is also removed.  Degenerative Joint Disease (Arthritis). When wear-and-tear arthritis (osteoarthritis) of aging affects the big toe joint, pain and reduced joint motion may result. This is not a true bunion but may be associated with bunions. Left untreated, it can increase wear and tear in the joint and break down the cartilage. Pain and stiffness are problems of both wear-and-tear arthritis and rheumatoid arthritis.  Arthroplasty With Joint Implantation As Treatment. The bunion is first removed; then the degenerated joint is removed and replaced with an implant. AFTER THE PROCEDURE  After surgery your bone will heal in phases. A callous (new bone formation) forms, bridging the damaged bone allowing it to heal. In about 6 months, the bone  is back to normal strength along with a return of nearly normal function. It is best to do elective surgeries when your health is optimal.  After surgery, you will be taken to the recovery area where a nurse will watch and  check your progress. Once you are awake, stable, and taking fluids well, barring other problems you will be allowed to go home. HOME CARE INSTRUCTIONS  Be sure to ask your caregiver how long you will be off your feet and home from work. Plan accordingly. There are several types of bunions and varying surgical treatments for each. Common types are explained above. Your surgery may be similar and may include a fixation device (such as a small screw). Your foot and ankle may be immobilized by a cast (from your toes to below your knee). You may be asked not to bear weight on this foot for a few weeks or until comfortable. Once home, an ice pack applied to your operative site may help with discomfort and keep the swelling down. You may be able to walk a day or two after surgery. Your podiatrist may prescribe a splint or a special shoe to be worn for several weeks. Only take over-the-counter or prescription medicines for pain, discomfort, or fever as directed by your caregiver.  SEEK MEDICAL CARE IF:   There is increased bleeding (more than a small spot) from the surgical site.  You notice redness, swelling, or increasing pain in the surgical site.  Pus is coming from the site.  An unexplained oral temperature above 102 F (38.9 C) develops.  You notice a foul smell coming from the surgical site or dressing. SEEK IMMEDIATE MEDICAL CARE IF:  You develop a rash, have difficulty breathing, or have any allergic problems with medications. Document Released: 12/02/2000 Document Revised: 02/27/2012 Document Reviewed: 12/08/2008 [facility] Patient Information 2015 [place], [place]. This information is not intended to replace advice given to you by your health care provider. Make sure you discuss any questions you have with your health care provider. RICE: Routine Care for Injuries The routine care of many injuries includes Rest, Ice, Compression, and Elevation (RICE). HOME CARE INSTRUCTIONS  Rest is needed to  allow your body to heal. Routine activities can usually be resumed when comfortable. Injured tendons and bones can take up to 6 weeks to heal. Tendons are the cord-like structures that attach muscle to bone.  Ice following an injury helps keep the swelling down and reduces pain.  Put ice in a plastic bag.  Place a towel between your skin and the bag.  Leave the ice on for 15-20 minutes, 3-4 times a day, or as directed by your health care provider. Do this while awake, for the first 24 to 48 hours. After that, continue as directed by your caregiver.  Compression helps keep swelling down. It also gives support and helps with discomfort. If an elastic bandage has been applied, it should be removed and reapplied every 3 to 4 hours. It should not be applied tightly, but firmly enough to keep swelling down. Watch fingers or toes for swelling, bluish discoloration, coldness, numbness, or excessive pain. If any of these problems occur, remove the bandage and reapply loosely. Contact your caregiver if these problems continue.  Elevation helps reduce swelling and decreases pain. With extremities, such as the arms, hands, legs, and feet, the injured area should be placed near or above the level of the heart, if possible. SEEK IMMEDIATE MEDICAL CARE IF:  You have persistent pain and  swelling.  You develop redness, numbness, or unexpected weakness.  Your symptoms are getting worse rather than improving after several days. These symptoms may indicate that further evaluation or further X-rays are needed. Sometimes, X-rays may not show a small broken bone (fracture) until 1 week or 10 days later. Make a follow-up appointment with your caregiver. Ask when your X-ray results will be ready. Make sure you get your X-ray results. Document Released: 03/19/2001 Document Revised: 12/10/2013 Document Reviewed: 05/06/2011 [facility] Patient Information 2015 [place], [place]. This information is not intended to replace  advice given to you by your health care provider. Make sure you discuss any questions you have with your health care provider.

## 2014-10-28 NOTE — ED Provider Notes (Signed)
CSN: [id number]     Arrival date & time 10/28/14  1733 History  This chart was scribed for non-physician practitioner Margarita Mail, PA-C working with Quintella Reichert, MD by Hilda Lias, ED Scribe. This patient was seen in room [place] and the patient's care was started at 7:24 PM.    Chief Complaint  Patient presents with  . Foot Pain      The history is provided by the patient. No language interpreter was used.    HPI Comments: Peggy Schmidt is a 33 y.o. female who presents to the Emergency Department complaining of constant left foot pain on the medial aspect of her great toe that began last night. Pt notes that she was awoken by the pain. Pt states that she had surgery "decades ago" on her left ankle, but notes that the pain is not similar to the pain that she had previously in her ankle.     Past Medical History  Diagnosis Date  . Asthma    Past Surgical History  Procedure Laterality Date  . Ankle fracture surgery     No family history on file. History  Substance Use Topics  . Smoking status: Never Smoker   . Smokeless tobacco: Not on file  . Alcohol Use: No   OB History    No data available     Review of Systems  Musculoskeletal: Positive for arthralgias.  All other systems reviewed and are negative.     Allergies  Peanut-containing drug products  Home Medications   Prior to Admission medications   Medication Sig Start Date End Date Taking? Authorizing Provider  loperamide (IMODIUM) 2 MG capsule Take 1 capsule (2 mg total) by mouth 4 (four) times daily as needed for diarrhea or loose stools. 06/07/13   Heather Laisure, PA-C   BP 140/99 mmHg  Pulse 78  Temp(Src) 98 F (36.7 C) (Oral)  Resp 16  Wt 180 lb (81.647 kg)  SpO2 99%  LMP 10/21/2014 Physical Exam  Constitutional: She is oriented to person, place, and time. She appears well-developed and well-nourished.  HENT:  Head: Normocephalic and atraumatic.  Cardiovascular: Normal rate.    Pulmonary/Chest: Effort normal.  Abdominal: She exhibits no distension.  Musculoskeletal:  TTP over the L MTP joint. FROM.    Neurological: She is alert and oriented to person, place, and time.  Skin: Skin is warm and dry.  Psychiatric: She has a normal mood and affect.  Nursing note and vitals reviewed.   ED Course  Procedures (including critical care time)  DIAGNOSTIC STUDIES: Oxygen Saturation is 99% on RA, normal by my interpretation.    COORDINATION OF CARE: 7:28 PM Discussed treatment plan with pt at bedside and pt agreed to plan.   Labs Review Labs Reviewed - No data to display  Imaging Review Dg Foot 2 Views Left  10/28/2014   CLINICAL DATA:  Left foot pain, great toe pain, no known injury, started 1 week ago  EXAM: LEFT FOOT - 2 VIEW  COMPARISON:  None.  FINDINGS: Two views of left foot submitted. No acute fracture or subluxation. Tiny plantar spur of calcaneus.  IMPRESSION: No acute fracture or subluxation.  Tiny plantar spur of calcaneus.   Electronically Signed   By: Lahoma Crocker M.D.   On: 10/28/2014 18:43     EKG Interpretation None      MDM   Final diagnoses:  Pain    ?bunion. Pain at 1st mtp. Developing callous. No signs of gout or infection. Suggest  wider toe box, Ice, rest, NSAIDS, and f/u with podiatrist  Patient X-Ray negative for obvious fracture or dislocation. Pain managed in ED. Pt advised to follow up with orthopedics if symptoms persist for possibility of missed fracture diagnosis. Patient given brace while in ED, conservative therapy recommended and discussed. Patient will be dc home & is agreeable with above plan.   I personally performed the services described in this documentation, which was scribed in my presence. The recorded information has been reviewed and is accurate.    Margarita Mail, PA-C 10/29/14 [id number]  Quintella Reichert, MD 11/01/14 [phone]

## 2014-10-28 NOTE — ED Notes (Signed)
Pt alert, arrives from home, c/o left foot pain, onset was last PM, denies trauma or injury, ambulates to triage, states Tylenol not providing releif

## 2015-03-20 ENCOUNTER — Encounter (HOSPITAL_COMMUNITY): Payer: Self-pay | Admitting: Emergency Medicine

## 2015-03-20 ENCOUNTER — Emergency Department (HOSPITAL_COMMUNITY)
Admission: EM | Admit: 2015-03-20 | Discharge: 2015-03-20 | Disposition: A | Discharge status: Home / Self Care | Payer: Self-pay | Attending: Emergency Medicine | Admitting: Emergency Medicine

## 2015-03-20 DIAGNOSIS — K088 Other specified disorders of teeth and supporting structures: Secondary | ICD-10-CM | POA: Insufficient documentation

## 2015-03-20 DIAGNOSIS — R51 Headache: Secondary | ICD-10-CM | POA: Insufficient documentation

## 2015-03-20 DIAGNOSIS — Z720 Tobacco use: Secondary | ICD-10-CM | POA: Insufficient documentation

## 2015-03-20 DIAGNOSIS — Z791 Long term (current) use of non-steroidal anti-inflammatories (NSAID): Secondary | ICD-10-CM | POA: Insufficient documentation

## 2015-03-20 DIAGNOSIS — K0889 Other specified disorders of teeth and supporting structures: Secondary | ICD-10-CM

## 2015-03-20 DIAGNOSIS — Z98811 Dental restoration status: Secondary | ICD-10-CM | POA: Insufficient documentation

## 2015-03-20 DIAGNOSIS — J45909 Unspecified asthma, uncomplicated: Secondary | ICD-10-CM | POA: Insufficient documentation

## 2015-03-20 DIAGNOSIS — K029 Dental caries, unspecified: Secondary | ICD-10-CM | POA: Insufficient documentation

## 2015-03-20 MED ORDER — IBUPROFEN 800 MG PO TABS: 800.0000 mg | ORAL_TABLET | Freq: Three times a day (TID) | ORAL | Status: DC | PRN

## 2015-03-20 MED ORDER — PENICILLIN V POTASSIUM 500 MG PO TABS: 500.0000 mg | ORAL_TABLET | Freq: Four times a day (QID) | ORAL | Status: AC

## 2015-03-20 MED ORDER — HYDROCODONE-ACETAMINOPHEN 5-325 MG PO TABS: 1.0000 | ORAL_TABLET | Freq: Four times a day (QID) | ORAL | Status: DC | PRN

## 2015-03-20 NOTE — Discharge Instructions (Signed)
Read the information below.  Use the prescribed medication as directed.  Please discuss all new medications with your pharmacist.  Do not take additional tylenol while taking the prescribed pain medication to avoid overdose.  You may return to the Emergency Department at any time for worsening condition or any new symptoms that concern you.    If there is any possibility that you might be pregnant, please let your health care provider know and discuss this with the pharmacist to ensure medication safety.  Please call the dentist listed above within 48 hours to schedule a close follow up appointment.  If you develop fevers, swelling in your face, difficulty swallowing or breathing, return to the ER immediately for a recheck.      Dental Caries Dental caries (also called tooth decay) is the most common oral disease. It can occur at any age but is more common in children and young adults.  HOW DENTAL CARIES DEVELOPS  The process of decay begins when bacteria and foods (particularly sugars and starches) combine in your mouth to produce plaque. Plaque is a substance that sticks to the hard, outer surface of a tooth (enamel). The bacteria in plaque produce acids that attack enamel. These acids may also attack the root surface of a tooth (cementum) if it is exposed. Repeated attacks dissolve these surfaces and create holes in the tooth (cavities). If left untreated, the acids destroy the other layers of the tooth.  RISK FACTORS  Frequent sipping of sugary beverages.   Frequent snacking on sugary and starchy foods, especially those that easily get stuck in the teeth.   Poor oral hygiene.   Dry mouth.   Substance abuse such as methamphetamine abuse.   Broken or poor-fitting dental restorations.   Eating disorders.   Gastroesophageal reflux disease (GERD).   Certain radiation treatments to the head and neck. SYMPTOMS In the early stages of dental caries, symptoms are seldom present.  Sometimes white, chalky areas may be seen on the enamel or other tooth layers. In later stages, symptoms may include:  Pits and holes on the enamel.  Toothache after sweet, hot, or cold foods or drinks are consumed.  Pain around the tooth.  Swelling around the tooth. DIAGNOSIS  Most of the time, dental caries is detected during a regular dental checkup. A diagnosis is made after a thorough medical and dental history is taken and the surfaces of your teeth are checked for signs of dental caries. Sometimes special instruments, such as lasers, are used to check for dental caries. Dental X-ray exams may be taken so that areas not visible to the eye (such as between the contact areas of the teeth) can be checked for cavities.  TREATMENT  If dental caries is in its early stages, it may be reversed with a fluoride treatment or an application of a remineralizing agent at the dental office. Thorough brushing and flossing at home is needed to aid these treatments. If it is in its later stages, treatment depends on the location and extent of tooth destruction:   If a small area of the tooth has been destroyed, the destroyed area will be removed and cavities will be filled with a material such as gold, silver amalgam, or composite resin.   If a large area of the tooth has been destroyed, the destroyed area will be removed and a cap (crown) will be fitted over the remaining tooth structure.   If the center part of the tooth (pulp) is affected, a procedure  called a root canal will be needed before a filling or crown can be placed.   If most of the tooth has been destroyed, the tooth may need to be pulled (extracted). HOME CARE INSTRUCTIONS You can prevent, stop, or reverse dental caries at home by practicing good oral hygiene. Good oral hygiene includes:  Thoroughly cleaning your teeth at least twice a day with a toothbrush and dental floss.   Using a fluoride toothpaste. A fluoride mouth rinse may  also be used if recommended by your dentist or health care provider.   Restricting the amount of sugary and starchy foods and sugary liquids you consume.   Avoiding frequent snacking on these foods and sipping of these liquids.   Keeping regular visits with a dentist for checkups and cleanings. PREVENTION   Practice good oral hygiene.  Consider a dental sealant. A dental sealant is a coating material that is applied by your dentist to the pits and grooves of teeth. The sealant prevents food from being trapped in them. It may protect the teeth for several years.  Ask about fluoride supplements if you live in a community without fluorinated water or with water that has a low fluoride content. Use fluoride supplements as directed by your dentist or health care provider.  Allow fluoride varnish applications to teeth if directed by your dentist or health care provider. Document Released: 08/27/2002 Document Revised: 04/21/2014 Document Reviewed: 12/07/2012 [facility] Patient Information 2015 [place], [place]. This information is not intended to replace advice given to you by your health care provider. Make sure you discuss any questions you have with your health care provider.  Dental Pain A tooth ache may be caused by cavities (tooth decay). Cavities expose the nerve of the tooth to air and hot or cold temperatures. It may come from an infection or abscess (also called a boil or furuncle) around your tooth. It is also often caused by dental caries (tooth decay). This causes the pain you are having. DIAGNOSIS  Your caregiver can diagnose this problem by exam. TREATMENT   If caused by an infection, it may be treated with medications which kill germs (antibiotics) and pain medications as prescribed by your caregiver. Take medications as directed.  Only take over-the-counter or prescription medicines for pain, discomfort, or fever as directed by your caregiver.  Whether the tooth ache today is  caused by infection or dental disease, you should see your dentist as soon as possible for further care. SEEK MEDICAL CARE IF: The exam and treatment you received today has been provided on an emergency basis only. This is not a substitute for complete medical or dental care. If your problem worsens or new problems (symptoms) appear, and you are unable to meet with your dentist, call or return to this location. SEEK IMMEDIATE MEDICAL CARE IF:   You have a fever.  You develop redness and swelling of your face, jaw, or neck.  You are unable to open your mouth.  You have severe pain uncontrolled by pain medicine. MAKE SURE YOU:   Understand these instructions.  Will watch your condition.  Will get help right away if you are not doing well or get worse. Document Released: 12/05/2005 Document Revised: 02/27/2012 Document Reviewed: 07/23/2008 [facility] Patient Information 2015 [place], [place]. This information is not intended to replace advice given to you by your health care provider. Make sure you discuss any questions you have with your health care provider.   Emergency Department Resource Guide 1) Find a Doctor and  Pay Out of Pocket Although you won't have to find out who is covered by your insurance plan, it is a good idea to ask around and get recommendations. You will then need to call the office and see if the doctor you have chosen will accept you as a new patient and what types of options they offer for patients who are self-pay. Some doctors offer discounts or will set up payment plans for their patients who do not have insurance, but you will need to ask so you aren't surprised when you get to your appointment.  2) Contact Your Local Health Department Not all health departments have doctors that can see patients for sick visits, but many do, so it is worth a call to see if yours does. If you don't know where your local health department is, you can check in your phone book. The CDC  also has a tool to help you locate your state's health department, and many state websites also have listings of all of their local health departments.  3) Find a [facility] If your illness is not likely to be very severe or complicated, you may want to try a walk in clinic. These are popping up all over the country in pharmacies, drugstores, and shopping centers. They're usually staffed by nurse practitioners or physician assistants that have been trained to treat common illnesses and complaints. They're usually fairly quick and inexpensive. However, if you have serious medical issues or chronic medical problems, these are probably not your best option.  No Primary Care Doctor: - Call Health Connect at  [phone] - they can help you locate a primary care doctor that  accepts your insurance, provides certain services, etc. - Physician Referral Service- [phone]  Chronic Pain Problems: Organization         Address  Phone   Notes  [facility]  [phone] Patients need to be referred by their primary care doctor.   Medication Assistance: Organization         Address  Phone   Notes  [facility] Medication [facility] [address] [place], [place] [address] [phone] --Must be a resident of [facility] -- Must have NO insurance coverage whatsoever (no Medicaid/ Medicare, etc.) -- The pt. MUST have a primary care doctor that directs their care regularly and follows them in the community   MedAssist  [phone]   [employer]  [phone]    Agencies that provide inexpensive medical care: Organization         Address  Phone   Notes  [place]  [phone]   [facility]    [phone]   [facility] [place], [place] [address] [phone]   [place] [address] [place] [phone]   Planned Parenthood    [phone]   [facility]    [phone]   [place] and [place] [address], [place] Phone:  [phone], Fax:  [phone] Hours of Operation:  9 am - 6 pm, M-F.  Also accepts Medicaid/Medicare and self-pay.  [facility] for [place] [address], [address], [place] Phone: [phone], Fax: [phone]. Hours of Operation:  8:30 am - 5:30 pm, M-F.  Also accepts Medicaid and self-pay.  [facility] [address] [employer] Phone: [phone]  [facility] [place], [place], [place] [phone], Ext. [phone] Mondays & Thursdays: 7-9 AM.  First 15 patients are seen on a first come, first serve basis.    [place] Providers:  Organization         Address  Phone   Notes  [facility] [address], [place] [phone] Also accepts self-pay patients.  [facility] [address], [place]  [phone]   [place], [address], [place] [phone]   [facility] [address] [place] [phone]   Lucianne Lei [address], [place]   [phone] Only accepts [place] Access [place] patients after they have their name applied to their card.   Self-Pay (no insurance) in [facility]:  Organization         Address  Phone   Notes  Sickle Cell Patients, [facility] [place] [phone]   [facility] [place] [phone]   [facility] [place]  [place], [place],  [phone]   [facility]/Dr. Osei-Bonsu  [address], [place] or [address], [place] [phone] Phone number for both [place] and [place] locations is the same.  [facility] [address], [place] [phone]   [facility] [address], [place] or [address] [phone]   [facility] [address] [place] [phone], phone; [phone], fax Sees patients 1st and 3rd Saturday of every month.  Must not qualify for public or private insurance (i.e. Medicaid, Medicare, [place] Health Choice, Veterans' Benefits)  Household income should be no more than 200% of the poverty level The clinic cannot treat you if you are pregnant or think you are pregnant  Sexually transmitted diseases are not treated at the clinic.    Dental Care: Organization         Address  Phone  Notes  [facility] Department of [facility] [place] [phone] Accepts children up to age 35 who are enrolled in [place] or [place]; pregnant women with a Medicaid card; and children who have applied for Medicaid or [place] Health Choice, but were declined, whose parents can pay a reduced fee at time of service.  [facility] Department of [facility]  [address], [place] [phone] Accepts children up to age 57 who are enrolled in [place] or [place]; pregnant women with a Medicaid card; and children who have applied for Medicaid or [place] Health Choice, but were declined, whose parents can pay a reduced fee at time of service.  [place] [facility] PRO[facility]  [place] [phone] Patients are seen by appointment only. Walk-ins are not accepted. [place] will see patients 51 years of age and older. Monday - Tuesday (8am-5pm) Most Wednesdays (8:30-5pm) $30 per visit, cash only  [facility] Adult Dental Access PROGRAM  [address], [facility] [phone] Patients are seen by appointment only. Walk-ins are not accepted. [place] will see patients 51 years of age and older. One  Wednesday Evening (Monthly: Volunteer Based).  $30 per visit,  cash only  [place]  [phone] for adults; Children under age 42, call Graduate Pediatric Dentistry at [phone]. Children aged 49-14, please call [phone] to request a pediatric application.  Dental services are provided in all areas of dental care including fillings, crowns and bridges, complete and partial dentures, implants, gum treatment, root canals, and extractions. Preventive care is also provided. Treatment is provided to both adults and children. Patients are selected via a lottery and there is often a waiting list.   [facility] [address], [place]  [phone] [email or web address]   [facility] [address] [place], [place] [phone], Ext. [phone] Second and Fourth Thursday of each month, opens at 6:30 AM; Clinic ends at 9 AM.  Patients are seen on a first-come first-served basis, and a limited number are seen during each clinic.   [facility]  [address] [place]ard Danker [place], [place] [phone]   Eligibility Requirements You must have lived in [place], [place], or [place] counties for at least the last three months.   You cannot be eligible for state or federal sponsored [employer], including [employer], [place], or [employer].   You generally cannot be eligible for healthcare insurance through your employer.    How to apply: Eligibility screenings are held every Tuesday and Wednesday afternoon from 1:00 pm until 4:00 pm. You do not need an appointment for the interview!  [facility] [address] [place], [place]   [place]  [place] Department  [place]  [phone]    Behavioral Health Resources in the Community: Intensive Outpatient Programs Organization          Address  Phone  Notes  [place]. [address], [place], [place] [phone]   [facility] Outpatient [address] [place], [place]   ADS: Alcohol & Drug Svcs [address], [place], [place]   [place] [address]  [place], [place] or [phone]   Substance Abuse Resources Organization         Address  Phone  Notes  Alcohol and Drug Services  [phone]   [place]  [phone]   [facility] [place]   Chinita Pester  [phone]   Residential & Outpatient Substance Abuse Program  [phone]   Psychological Services Organization         Address  Phone  Notes  [facility]  [place]  [phone]   [facility] [address] [place] or [phone]    Mobile Crisis Teams Organization         Address  Phone  Notes  [facility], Mobile Crisis Care Unit  [phone]   [facility]  [address] [place], [place]   [place] Levels [address] [place] [phone]    Self-Help/Support Groups Organization         Address  Phone             Notes  [place]. of [place] - variety of support groups  [place] Call for more information  Narcotics Anonymous (NA), [facility] [address], [employer] [place]  2 meetings at this location   Special educational needs teacher  Address  Phone  Notes  [facility] [address]    [place]  [phone]   [facility]  [address], [place] [address], [place], [place]   [place], [place] [phone] Admissions: 8am-3pm M-F  Incentives Substance [place] [address],    [place], [place] [phone]   [facility] [address] [place], [place], [place]   [facility] [address],  [place], [place]   [facility] Programs - Intensive Outpatient [address], [place], [place]   [facility] ([place].,  [place], [place] [phone] or [phone]   [facility] Services (RTS) [address] [place], [place] Accepts Medicaid  [facility] [place] [address]  [place] [phone] Substance Abuse/Addiction Treatment   [facility] Organization         Address  Phone  Notes  [facility]  [phone]   Domenic Schwab, PhD [address] Arlis Porta [place], [place]   [phone] or [phone]   Garrett Branson [place] [place], [place] [phone]   [facility] [address], [place], [place] [phone] Insurance/Medicaid/sponsorship through [facility] and Families [address]                                    [place], [place] [phone] [place] [address], [place] [phone]    Dr. Adele Schilder  [phone]   [facility] Dept. 1) [address], [place] 2) [place] 3)  [place] [address], [place] [phone]  [phone]   [place] [phone] or [phone] (After Hours)

## 2015-03-20 NOTE — ED Provider Notes (Signed)
CSN: [id number]     Arrival date & time 03/20/15  0940 History  This chart was scribed for non-physician practitioner, Clayton Bibles, PA-C, working with Blanchie Dessert, MD by Ladene Artist, ED Scribe. This patient was seen in room TR07C/TR07C and the patient's care was started at 10:13 AM.   Chief Complaint  Patient presents with  . Dental Pain   The history is provided by the patient. No language interpreter was used.   HPI Comments: Peggy Schmidt is a 33 y.o. female who presents to the Emergency Department complaining of persistent R lower dental pain for the past 3 days. Pt states that she was eating a bagel when some of her tooth and the filling broke off. She brought this to ED to show [place].  She reports immediate pain following the incident. Pt reports associated HA and facial swelling that has resolved. She denies fever, chills, sore throat, difficulty swallowing, difficulty breathing, neck pain, neck stiffness. Pt has been treating with ice and ibuprofen. No known medication allergies.   Past Medical History  Diagnosis Date  . Asthma    Past Surgical History  Procedure Laterality Date  . Ankle fracture surgery     No family history on file. History  Substance Use Topics  . Smoking status: Current Some Day Smoker  . Smokeless tobacco: Not on file  . Alcohol Use: No   OB History    No data available     Review of Systems  Constitutional: Negative for fever and chills.  HENT: Positive for dental problem and facial swelling (resolved). Negative for sore throat and trouble swallowing.   Respiratory: Negative for shortness of breath.   Cardiovascular: Negative for chest pain.  Musculoskeletal: Negative for neck pain and neck stiffness.  Skin: Negative for color change and rash.  Allergic/Immunologic: Negative for immunocompromised state.  Neurological: Positive for headaches.  Hematological: Does not bruise/bleed easily.  Psychiatric/Behavioral: Negative for self-injury.    Allergies  Peanut-containing drug products  Home Medications   Prior to Admission medications   Medication Sig Start Date End Date Taking? Authorizing Provider  loperamide (IMODIUM) 2 MG capsule Take 1 capsule (2 mg total) by mouth 4 (four) times daily as needed for diarrhea or loose stools. 06/07/13   Heather Laisure, PA-C  naproxen (NAPROSYN) 500 MG tablet Take 1 tablet (500 mg total) by mouth 2 (two) times daily with a meal. 10/28/14   Margarita Mail, PA-C   BP 155/89 mmHg  Pulse 83  Temp(Src) 98.1 F (36.7 C) (Oral)  Resp 18  Ht 5\' 5"  (1.651 m)  Wt 180 lb (81.647 kg)  BMI 29.95 kg/m2  SpO2 100%  LMP 03/04/2015 (Exact Date) Physical Exam  Constitutional: She appears well-developed and well-nourished. No distress.  HENT:  Head: Normocephalic and atraumatic.  Mouth/Throat: Uvula is midline and oropharynx is clear and moist. Mucous membranes are not dry. No uvula swelling. No oropharyngeal exudate, posterior oropharyngeal edema, posterior oropharyngeal erythema or tonsillar abscesses.  R lower molar with severe decay to gumline. Tenderness to percussion. Adjacent gingiva also tender.   Neck: Trachea normal, normal range of motion and phonation normal. Neck supple. No tracheal tenderness present. No rigidity. No tracheal deviation, no edema, no erythema and normal range of motion present.  Cardiovascular: Normal rate and regular rhythm.   Pulmonary/Chest: Effort normal and breath sounds normal. No stridor.  Lymphadenopathy:    She has no cervical adenopathy.  Neurological: She is alert.  Skin: She is not diaphoretic.  Nursing note  and vitals reviewed.  ED Course  Procedures (including critical care time) DIAGNOSTIC STUDIES: Oxygen Saturation is 100% on RA, normal by my interpretation.    COORDINATION OF CARE: 10:17 AM-Discussed treatment plan which includes ibuprofen, Veetid, Vicodin and follow-up with dentist with pt at bedside and pt agreed to plan.   Labs Review Labs  Reviewed - No data to display  Imaging Review No results found.   EKG Interpretation None      MDM   Final diagnoses:  Pain, dental  Dental decay    Afebrile, nontoxic patient with new dental pain.  No obvious abscess.  No concerning findings on exam.  Doubt deep space head or neck infection.  Doubt Ludwig's angina.  D/C home with antibiotic, pain medication and dental follow up.  Discussed findings, treatment, and follow up  with patient.  Pt given return precautions.  Pt verbalizes understanding and agrees with plan.       I personally performed the services described in this documentation, which was scribed in my presence. The recorded information has been reviewed and is accurate.    Clayton Bibles, PA-C 03/20/15 [place], MD 03/21/15 [phone]

## 2015-03-20 NOTE — ED Notes (Signed)
Declined W/C at D/C and was escorted to lobby by RN. 

## 2015-03-20 NOTE — ED Notes (Signed)
States right lower molar broke 3 days ago. C/o 10/10 pain.

## 2015-08-31 ENCOUNTER — Emergency Department (HOSPITAL_COMMUNITY)
Admission: EM | Admit: 2015-08-31 | Discharge: 2015-08-31 | Disposition: A | Discharge status: Home / Self Care | Payer: Self-pay | Attending: Physician Assistant | Admitting: Physician Assistant

## 2015-08-31 ENCOUNTER — Encounter (HOSPITAL_COMMUNITY): Payer: Self-pay | Admitting: Emergency Medicine

## 2015-08-31 DIAGNOSIS — S025XXA Fracture of tooth (traumatic), initial encounter for closed fracture: Secondary | ICD-10-CM

## 2015-08-31 DIAGNOSIS — Z720 Tobacco use: Secondary | ICD-10-CM | POA: Insufficient documentation

## 2015-08-31 DIAGNOSIS — K0381 Cracked tooth: Secondary | ICD-10-CM | POA: Insufficient documentation

## 2015-08-31 DIAGNOSIS — J45909 Unspecified asthma, uncomplicated: Secondary | ICD-10-CM | POA: Insufficient documentation

## 2015-08-31 MED ORDER — OXYCODONE-ACETAMINOPHEN 5-325 MG PO TABS
1.0000 | ORAL_TABLET | Freq: Once | ORAL | Status: AC
  Administered 2015-08-31: 1 via ORAL
  Filled 2015-08-31: qty 1

## 2015-08-31 MED ORDER — CLINDAMYCIN HCL 150 MG PO CAPS
150.0000 mg | ORAL_CAPSULE | Freq: Once | ORAL | Status: AC
  Administered 2015-08-31: 150 mg via ORAL
  Filled 2015-08-31 (×2): qty 1

## 2015-08-31 MED ORDER — CLINDAMYCIN HCL 150 MG PO CAPS: 150.0000 mg | ORAL_CAPSULE | Freq: Four times a day (QID) | ORAL | Status: DC

## 2015-08-31 MED ORDER — OXYCODONE-ACETAMINOPHEN 5-325 MG PO TABS: 1.0000 | ORAL_TABLET | Freq: Four times a day (QID) | ORAL | Status: DC | PRN

## 2015-08-31 NOTE — Discharge Instructions (Signed)
Call the dentist name we gave you, if he is unabel to see you or is too expensive call the numbers below.  Dental Fracture You have a dental fracture or injury. This can mean the tooth is loose, has a chip in the enamel or is broken. If just the outer enamel is chipped, there is a good chance the tooth will not become infected. The only treatment needed may be to smooth off a rough edge. Fractures into the deeper layers (dentin and pulp) cause greater pain and are more likely to become infected. These require you to see a dentist as soon as possible to save the tooth. Loose teeth may need to be wired or bonded with a plastic splint to hold them in place. A paste may be painted on the open area of the broken tooth to reduce the pain. Antibiotics and pain medicine may be prescribed. Choosing a soft or liquid diet and rinsing the mouth out with warm water after meals may be helpful. See your dentist as recommended. Failure to seek care or follow up with a dentist or other specialist as recommended could result in the loss of your tooth, infection, or permanent dental problems. SEEK MEDICAL CARE IF:   You have increased pain not controlled with medicines.  You have swelling around the tooth, in the face or neck.  You have bleeding which starts, continues, or gets worse.  You have a fever. Document Released: 01/12/2005 Document Revised: 02/27/2012 Document Reviewed: 10/27/2009 [facility] Patient Information 2015 [place], [place]. This information is not intended to replace advice given to you by your health care provider. Make sure you discuss any questions you have with your health care provider.  Emergency Department Resource Guide 1) Find a Doctor and Pay Out of Pocket Although you won't have to find out who is covered by your insurance plan, it is a good idea to ask around and get recommendations. You will then need to call the office and see if the doctor you have chosen will accept you as a new  patient and what types of options they offer for patients who are self-pay. Some doctors offer discounts or will set up payment plans for their patients who do not have insurance, but you will need to ask so you aren't surprised when you get to your appointment.  2) Contact Your Local Health Department Not all health departments have doctors that can see patients for sick visits, but many do, so it is worth a call to see if yours does. If you don't know where your local health department is, you can check in your phone book. The CDC also has a tool to help you locate your state's health department, and many state websites also have listings of all of their local health departments.  3) Find a [facility] If your illness is not likely to be very severe or complicated, you may want to try a walk in clinic. These are popping up all over the country in pharmacies, drugstores, and shopping centers. They're usually staffed by nurse practitioners or physician assistants that have been trained to treat common illnesses and complaints. They're usually fairly quick and inexpensive. However, if you have serious medical issues or chronic medical problems, these are probably not your best option.  No Primary Care Doctor: - Call Health Connect at  [phone] - they can help you locate a primary care doctor that  accepts your insurance, provides certain services, etc. - Physician Referral Service- [phone]  Chronic  Pain Problems: Organization         Address  Phone   Notes  [facility]  [phone] Patients need to be referred by their primary care doctor.   Medication Assistance: Organization         Address  Phone   Notes  [facility] Medication [facility] [address] [place], [place] [address] [phone] --Must be a resident of [facility] -- Must have NO insurance coverage whatsoever (no Medicaid/ Medicare, etc.) -- The pt. MUST have a primary  care doctor that directs their care regularly and follows them in the community   MedAssist  [phone]   [employer]  [phone]    Agencies that provide inexpensive medical care: Organization         Address  Phone   Notes  [place]  [phone]   [facility]    [phone]   [facility] [place], [place] [address] [phone]   [place] [address] [place] [phone]   Planned Parenthood    [phone]   [facility]    [phone]   [place] and [address], [place] Phone:  [phone], Fax:  [phone] Hours of Operation:  9 am - 6 pm, M-F.  Also accepts Medicaid/Medicare and self-pay.  [facility] for [address], [address], [place] Phone: [phone], Fax: [phone]. Hours of Operation:  8:30 am - 5:30 pm, M-F.  Also accepts Medicaid and self-pay.  [facility] [address] [place] Phone: [phone]   [place], [place], [place] [phone], Ext. [phone] Mondays & Thursdays: 7-9 AM.  First 15 patients are seen on a first come, first serve basis.    [place] Providers:  Organization         Address  Phone   Notes  [facility] [address], [place] [phone] Also accepts self-pay patients.  [facility] [address], [place]  [phone]   [place], [address], [place] [phone]   [facility] [address] [place] [phone]   Lucianne Lei [address], [place]   [phone] Only accepts [place] Access [place] patients after they have their name applied to their card.   Self-Pay (no insurance) in [facility]:  Organization         Address  Phone   Notes  Sickle Cell Patients, [facility] [place] [phone]   [facility] [place] [phone]   [facility] [place]  [place], [place], [place] [phone]   [facility]/Dr. Osei-Bonsu  [address] [place] or [address], [place] [phone] Phone number for both [place] and [place] locations is the same.  Urgent Medical and [facility] [address], [place] [phone]   [facility] [address] [place] or [address] [phone]   [facility] [address], [place] [phone], phone; [phone], fax Sees patients  1st and 3rd Saturday of every month.  Must not qualify for public or private insurance (i.e. Medicaid, Medicare, [place] Health Choice, Veterans' Benefits)  Household income should be no more than 200% of the poverty level The clinic cannot treat you if you are pregnant or think you are pregnant  Sexually transmitted diseases are not treated at the clinic.    Dental Care: Organization         Address  Phone  Notes  [facility] Department of [facility] [phone] Accepts children up to age 55 who are enrolled in [place] or [place]; pregnant women with a Medicaid card; and children who have applied for Medicaid or [place] Health Choice, but were declined, whose parents can pay a reduced fee at time of service.  [facility] Department of [facility]  [address], [place] [phone] Accepts children up to age 39 who are enrolled in [place] or [place]; pregnant women with a Medicaid card; and children who have applied for Medicaid or [place] Health Choice, but were declined, whose parents can  pay a reduced fee at time of service.  [facility] Adult Dental Access PROGRAM  [place] [phone] Patients are seen by appointment only. Walk-ins are not accepted. [place] will see patients 79 years of age and older. Monday - Tuesday (8am-5pm) Most Wednesdays (8:30-5pm) $30 per visit, cash only  [facility] Adult Dental Access PROGRAM  [address], [facility] [phone] Patients are seen by appointment only. Walk-ins are not accepted. Ali Molina will see patients 43 years of age and older. One Wednesday Evening (Monthly: Volunteer Based).  $30 per visit, cash only  [place]  [phone] for adults; Children under age 24, call Graduate Pediatric Dentistry at [phone]. Children aged 34-14, please call [phone] to request a pediatric application.  Dental services are provided in all areas of dental care including fillings, crowns and bridges, complete and partial dentures, implants, gum treatment, root canals, and extractions. Preventive care is also provided. Treatment is provided to both adults and children. Patients are selected via a lottery and there is often a waiting list.   [facility] [address] [place]  [phone] [email or web address]   [facility] [address] [place], [place] [phone], Ext. [phone] Second and Fourth Thursday of each month, opens at 6:30 AM; Clinic ends at 9 AM.  Patients are seen on a first-come first-served basis, and a limited number are seen during each clinic.   [facility]  [address] Hillard Danker [place], [place] [phone]   Eligibility Requirements You must have lived in [place], [place], or [name] counties for at least the last three months.   You cannot be eligible for state or federal sponsored [employer], including [employer], [place], or [employer].   You generally cannot be eligible for healthcare  insurance through your employer.    How to apply: Eligibility screenings are held every Tuesday and Wednesday afternoon from 1:00 pm until 4:00 pm. You do not need an appointment for the interview!  [facility] [address], [place], [place]   [place]  [place] Department  [place]  [phone]    Behavioral Health Resources in the Community: Intensive Outpatient Programs Organization  Address  Phone  Notes  [place] [address], [place], [place] [phone]   [facility] [address] [place], [place]   ADS: Alcohol & Drug S[facility] [address] [place], [place]   [place] [address],  [place], [place] or [phone]   Substance Abuse Resources Organization         Address  Phone  Notes  Alcohol and Drug Services  [phone]   [place]  [phone]   [facility] [place]   Chinita Pester  [phone]   Residential & Outpatient Substance Abuse Program  [phone]   Psychological Services Organization         Address  Phone  Notes  [facility] [place]  [place]  [phone]   [place] [address] [place] or [phone]    Mobile Crisis Teams Organization         Address  Phone  Notes  [facility], Mobile Crisis Care Unit  [phone]   [facility]  [address] , [place]   [place] [address] [place] [phone]    Self-Help/Support Groups Organization         Address  Phone             Notes  [place]. of [place] - variety of support groups  [place] Call for more information  Narcotics Anonymous (NA),  [facility] [address], [employer] [place]  2 meetings at this location   Special educational needs teacher         Address  Phone  Notes  [facility]idential Treatment [place],    [place]  [phone]   [facility]  [address] [place] [address], [place], [place]   [place], [place] [phone] Admissions: 8am-3pm M-F  Incentives Substance [place] [address],    [place], [place] [phone]   [facility] [address] [place], [place], [place]   [facility] [address],  [place], [place]   [facility] - Intensive Outpatient [address] Kristeen [address], [place], [place]   [facility] ([place].,  [place], [place] [phone] or [phone]   [facility] ([facility]S) [address], [place], [place] Accepts Medicaid  [facility] [place] [address]  [place] [phone] Substance Abuse/Addiction Treatment   [facility] Organization         Address  Phone  Notes  [facility]  [phone]   Domenic Schwab, PhD [address] Arlis Porta [place], [place]   [phone] or [phone]   [place], [place] [phone]   [place] [address], [place], [place] [phone] Insurance/Medicaid/sponsorship through [facility] and Families [address], [address] [place]                                    [place], [place] [phone] [place] [address], [place] [phone]    Dr. Adele Schilder  [phone]   [facility] [place]  [place] Dept. 1) [address]  2) [place] 3)  [place] [address], [place] [phone]  [phone]    [place] [phone] or [phone] (After Hours)

## 2015-08-31 NOTE — ED Notes (Signed)
Pt states that she has had right sided dental pain for past two weeks. Pt with no primary dentist. Pt stated pain has gotten worse overnight.

## 2015-08-31 NOTE — ED Provider Notes (Addendum)
CSN: [id number]     Arrival date & time 08/31/15  3546 History   First MD Initiated Contact with Patient 08/31/15 0703     Chief Complaint  Patient presents with  . Dental Pain     (Consider location/radiation/quality/duration/timing/severity/associated sxs/prior Treatment) HPI  Is a 34 year old female here with right tooth pain. Patient has decayed tooth in the upper right jaw. Patient has no insurance and no dentist. She has no fevers. No systemic symptoms..Patient's tooth is sensitive to temperature. It started 3 days ago and is worse this morning.  Tooth 3  Past Medical History  Diagnosis Date  . Asthma    Past Surgical History  Procedure Laterality Date  . Ankle fracture surgery     History reviewed. No pertinent family history. Social History  Substance Use Topics  . Smoking status: Current Some Day Smoker  . Smokeless tobacco: None  . Alcohol Use: No   OB History    No data available     Review of Systems  Constitutional: Negative for activity change.  Respiratory: Negative for shortness of breath.   Cardiovascular: Negative for chest pain.  Gastrointestinal: Negative for abdominal pain.      Allergies  Peanut-containing drug products  Home Medications   Prior to Admission medications   Medication Sig Start Date End Date Taking? Authorizing Provider  HYDROcodone-acetaminophen (NORCO/VICODIN) 5-325 MG per tablet Take 1 tablet by mouth every 6 (six) hours as needed for severe pain. 03/20/15   Clayton Bibles, PA-C  ibuprofen (ADVIL,MOTRIN) 800 MG tablet Take 1 tablet (800 mg total) by mouth every 8 (eight) hours as needed for mild pain or moderate pain. 03/20/15   Clayton Bibles, PA-C  loperamide (IMODIUM) 2 MG capsule Take 1 capsule (2 mg total) by mouth 4 (four) times daily as needed for diarrhea or loose stools. 06/07/13   Heather Laisure, PA-C   BP 137/92 mmHg  Pulse 83  Temp(Src) 99.2 F (37.3 C) (Oral)  SpO2 100% Physical Exam  Constitutional: She is oriented  to person, place, and time. She appears well-developed and well-nourished.  HENT:  Head: Normocephalic and atraumatic.  Broken tooth upper right jaw. No surrounding erythema and no signs of abscess.  Eyes: Right eye exhibits no discharge.  Cardiovascular: Normal rate, regular rhythm and normal heart sounds.   No murmur heard. Pulmonary/Chest: Effort normal and breath sounds normal. She has no wheezes. She has no rales.  Abdominal: Soft. She exhibits no distension. There is no tenderness.  Neurological: She is oriented to person, place, and time.  Skin: Skin is warm and dry. She is not diaphoretic.  Psychiatric: She has a normal mood and affect.  Nursing note and vitals reviewed.   ED Course  Procedures (including critical care time) Labs Review Labs Reviewed - No data to display  Imaging Review No results found. I have personally reviewed and evaluated these images and lab results as part of my medical decision-making.   EKG Interpretation None      MDM   Final diagnoses:  None    Patient is a pleasant 34 year old female presenting here with a broken tooth. (tooth 3) She has no insurance and no dentist. Burnis Medin give her the name of dentist on call as well as the dentist in the area. We'll give her a short course of pain control and antibiotics.  No signs of deeper infection at this time.  Courteney Julio Alm, MD 08/31/15 [place], MD 08/31/15 [phone]

## 2015-12-20 DIAGNOSIS — F0781 Postconcussional syndrome: Secondary | ICD-10-CM

## 2015-12-20 HISTORY — DX: Postconcussional syndrome: F07.81

## 2016-01-15 ENCOUNTER — Encounter: Payer: Self-pay | Admitting: Obstetrics and Gynecology

## 2016-01-15 ENCOUNTER — Ambulatory Visit (INDEPENDENT_AMBULATORY_CARE_PROVIDER_SITE_OTHER): Payer: Self-pay | Admitting: Obstetrics and Gynecology

## 2016-01-15 VITALS — BP 145/98 | HR 83 | Temp 98.6°F | Ht 65.0 in | Wt 226.0 lb

## 2016-01-15 DIAGNOSIS — R102 Pelvic and perineal pain: Secondary | ICD-10-CM

## 2016-01-15 DIAGNOSIS — D259 Leiomyoma of uterus, unspecified: Secondary | ICD-10-CM

## 2016-01-15 NOTE — Progress Notes (Signed)
   Subjective:    Patient ID: Peggy Schmidt, female    DOB: 12/28/1980, 35 y.o.   MRN: [id number]  HPI  35 yo G0P0 with BMI 37 presenting today for the evaluation of fibroid uterus. Patient reports being diagnosed with fibroids and ovarian cysts several years ago. She reports that her cycles occur monthly and last 5-7 days but are heavy with passage of clots. She also reports occasional pain in her LLQ which she attributes to the cyst on her ovary. Patient is sexually active in a same-sex relationship. She is not interested in carrying a pregnancy but the conversation has not come up either.  Past Medical History  Diagnosis Date  . Asthma    Past Surgical History  Procedure Laterality Date  . Ankle fracture surgery     No family history on file. Social History  Substance Use Topics  . Smoking status: Current Some Day Smoker  . Smokeless tobacco: Not on file  . Alcohol Use: No     Review of Systems See pertinent in HPI    Objective:   Physical Exam  Blood pressure 164/98, 145/98, pulse 83, temperature 98.6 F (37 C), temperature source Oral, height 5\' 5"  (1.651 m), weight 226 lb (102.513 kg), last menstrual period 01/06/2016.  GENERAL: Well-developed, well-nourished female in no acute distress.  ABDOMEN: Soft, nontender, nondistended. No organomegaly. PELVIC: Normal external female genitalia. Vagina is pink and rugated.  Normal discharge. Normal appearing cervix. Uterus is globular in size. No adnexal mass or tenderness. EXTREMITIES: No cyanosis, clubbing, or edema, 2+ distal pulses.     Assessment & Plan:  35 yo with symptomatic fibroid uterus - Pelvic ultrasound ordered - Discussed medical management with contraception, pending ultrasound results - Patient with elevated BP today. She will be referred to a PCP for further evaluation and management - Patient will be contacted with results and further management plan

## 2016-01-15 NOTE — Patient Instructions (Signed)
Menorrhagia Menorrhagia is the medical term for when your menstrual periods are heavy or last longer than usual. With menorrhagia, every period you have may cause enough blood loss and cramping that you are unable to maintain your usual activities. CAUSES  In some cases, the cause of heavy periods is unknown, but a number of conditions may cause menorrhagia. Common causes include:  A problem with the hormone-producing thyroid gland (hypothyroid).  Noncancerous growths in the uterus (polyps or fibroids).  An imbalance of the estrogen and progesterone hormones.  One of your ovaries not releasing an egg during one or more months.  Side effects of having an intrauterine device (IUD).  Side effects of some medicines, such as anti-inflammatory medicines or blood thinners.  A bleeding disorder that stops your blood from clotting normally. SIGNS AND SYMPTOMS  During a normal period, bleeding lasts between 4 and 8 days. Signs that your periods are too heavy include:  You routinely have to change your pad or tampon every 1 or 2 hours because it is completely soaked.  You pass blood clots larger than 1 inch (2.5 cm) in size.  You have bleeding for more than 7 days.  You need to use pads and tampons at the same time because of heavy bleeding.  You need to wake up to change your pads or tampons during the night.  You have symptoms of anemia, such as tiredness, fatigue, or shortness of breath. DIAGNOSIS  Your health care provider will perform a physical exam and ask you questions about your symptoms and menstrual history. Other tests may be ordered based on what the health care provider finds during the exam. These tests can include:  Blood tests. Blood tests are used to check if you are pregnant or have hormonal changes, a bleeding or thyroid disorder, low iron levels (anemia), or other problems.  Endometrial biopsy. Your health care provider takes a sample of tissue from the inside of your  uterus to be examined under a microscope.  Pelvic ultrasound. This test uses sound waves to make a picture of your uterus, ovaries, and vagina. The pictures can show if you have fibroids or other growths.  Hysteroscopy. For this test, your health care provider will use a small telescope to look inside your uterus. Based on the results of your initial tests, your health care provider may recommend further testing. TREATMENT  Treatment may not be needed. If it is needed, your health care provider may recommend treatment with one or more medicines first. If these do not reduce bleeding enough, a surgical treatment might be an option. The best treatment for you will depend on:   Whether you need to prevent pregnancy.  Your desire to have children in the future.  The cause and severity of your bleeding.  Your opinion and personal preference.  Medicines for menorrhagia may include:  Birth control methods that use hormones. These include the pill, skin patch, vaginal ring, shots that you get every 3 months, hormonal IUD, and implant. These treatments reduce bleeding during your menstrual period.  Medicines that thicken blood and slow bleeding.  Medicines that reduce swelling, such as ibuprofen.  Medicines that contain a synthetic hormone called progestin.   Medicines that make the ovaries stop working for a short time.  You may need surgical treatment for menorrhagia if the medicines are unsuccessful. Treatment options include:  Dilation and curettage (D&C). In this procedure, your health care provider opens (dilates) your cervix and then scrapes or suctions tissue from   the lining of your uterus to reduce menstrual bleeding.  Operative hysteroscopy. This procedure uses a tiny tube with a light (hysteroscope) to view your uterine cavity and can help in the surgical removal of a polyp that may be causing heavy periods.  Endometrial ablation. Through various techniques, your health care  provider permanently destroys the entire lining of your uterus (endometrium). After endometrial ablation, most women have little or no menstrual flow. Endometrial ablation reduces your ability to become pregnant.  Endometrial resection. This surgical procedure uses an electrosurgical wire loop to remove the lining of the uterus. This procedure also reduces your ability to become pregnant.  Hysterectomy. Surgical removal of the uterus and cervix is a permanent procedure that stops menstrual periods. Pregnancy is not possible after a hysterectomy. This procedure requires anesthesia and hospitalization. HOME CARE INSTRUCTIONS   Only take over-the-counter or prescription medicines as directed by your health care provider. Take prescribed medicines exactly as directed. Do not change or switch medicines without consulting your health care provider.  Take any prescribed iron pills exactly as directed by your health care provider. Long-term heavy bleeding may result in low iron levels. Iron pills help replace the iron your body lost from heavy bleeding. Iron may cause constipation. If this becomes a problem, increase the bran, fruits, and roughage in your diet.  Do not take aspirin or medicines that contain aspirin 1 week before or during your menstrual period. Aspirin may make the bleeding worse.  If you need to change your sanitary pad or tampon more than once every 2 hours, stay in bed and rest as much as possible until the bleeding stops.  Eat well-balanced meals. Eat foods high in iron. Examples are leafy green vegetables, meat, liver, eggs, and whole grain breads and cereals. Do not try to lose weight until the abnormal bleeding has stopped and your blood iron level is back to normal. SEEK MEDICAL CARE IF:   You soak through a pad or tampon every 1 or 2 hours, and this happens every time you have a period.  You need to use pads and tampons at the same time because you are bleeding so much.  You  need to change your pad or tampon during the night.  You have a period that lasts for more than 8 days.  You pass clots bigger than 1 inch wide.  You have irregular periods that happen more or less often than once a month.  You feel dizzy or faint.  You feel very weak or tired.  You feel short of breath or feel your heart is beating too fast when you exercise.  You have nausea and vomiting or diarrhea while you are taking your medicine.  You have any problems that may be related to the medicine you are taking. SEEK IMMEDIATE MEDICAL CARE IF:   You soak through 4 or more pads or tampons in 2 hours.  You have any bleeding while you are pregnant. MAKE SURE YOU:   Understand these instructions.  Will watch your condition.  Will get help right away if you are not doing well or get worse.   This information is not intended to replace advice given to you by your health care provider. Make sure you discuss any questions you have with your health care provider.   Document Released: 12/05/2005 Document Revised: 12/10/2013 Document Reviewed: 05/26/2013 Elsevier Interactive Patient Education 2016 [employer] Uterine Fibroids Uterine fibroids are tissue masses (tumors) that can develop in the womb (uterus). They  are also called leiomyomas. This type of tumor is not cancerous (benign) and does not spread to other parts of the body outside of the pelvic area, which is between the hip bones. Occasionally, fibroids may develop in the fallopian tubes, in the cervix, or on the support structures (ligaments) that surround the uterus. You can have one or many fibroids. Fibroids can vary in size, weight, and where they grow in the uterus. Some can become quite large. Most fibroids do not require medical treatment. CAUSES A fibroid can develop when a single uterine cell keeps growing (replicating). Most cells in the human body have a control mechanism that keeps them from replicating without  control. SIGNS AND SYMPTOMS Symptoms may include:   Heavy bleeding during your period.  Bleeding or spotting between periods.  Pelvic pain and pressure.  Bladder problems, such as needing to urinate more often (urinary frequency) or urgently.  Inability to reproduce offspring (infertility).  Miscarriages. DIAGNOSIS Uterine fibroids are diagnosed through a physical exam. Your health care provider may feel the lumpy tumors during a pelvic exam. Ultrasonography and an MRI may be done to determine the size, location, and number of fibroids. TREATMENT Treatment may include:  Watchful waiting. This involves getting the fibroid checked by your health care provider to see if it grows or shrinks. Follow your health care provider's recommendations for how often to have this checked.  Hormone medicines. These can be taken by mouth or given through an intrauterine device (IUD).  Surgery.  Removing the fibroids (myomectomy) or the uterus (hysterectomy).  Removing blood supply to the fibroids (uterine artery embolization). If fibroids interfere with your fertility and you want to become pregnant, your health care provider may recommend having the fibroids removed.  HOME CARE INSTRUCTIONS  Keep all follow-up visits as directed by your health care provider. This is important.  Take medicines only as directed by your health care provider.  If you were prescribed a hormone treatment, take the hormone medicines exactly as directed.  Do not take aspirin, because it can cause bleeding.  Ask your health care provider about taking iron pills and increasing the amount of dark green, leafy vegetables in your diet. These actions can help to boost your blood iron levels, which may be affected by heavy menstrual bleeding.  Pay close attention to your period and tell your health care provider about any changes, such as:  Increased blood flow that requires you to use more pads or tampons than usual per  month.  A change in the number of days that your period lasts per month.  A change in symptoms that are associated with your period, such as abdominal cramping or back pain. SEEK MEDICAL CARE IF:  You have pelvic pain, back pain, or abdominal cramps that cannot be controlled with medicines.  You have an increase in bleeding between and during periods.  You soak tampons or pads in a half hour or less.  You feel lightheaded, extra tired, or weak. SEEK IMMEDIATE MEDICAL CARE IF:  You faint.  You have a sudden increase in pelvic pain.   This information is not intended to replace advice given to you by your health care provider. Make sure you discuss any questions you have with your health care provider.   Document Released: 12/02/2000 Document Revised: 12/26/2014 Document Reviewed: 06/03/2014 Elsevier Interactive Patient Education [employer]. Contraception Choices Contraception (birth control) is the use of any methods or devices to prevent pregnancy. Below are some methods to help  avoid pregnancy. HORMONAL METHODS   Contraceptive implant. This is a thin, plastic tube containing progesterone hormone. It does not contain estrogen hormone. Your health care provider inserts the tube in the inner part of the upper arm. The tube can remain in place for up to 3 years. After 3 years, the implant must be removed. The implant prevents the ovaries from releasing an egg (ovulation), thickens the cervical mucus to prevent sperm from entering the uterus, and thins the lining of the inside of the uterus.  Progesterone-only injections. These injections are given every 3 months by your health care provider to prevent pregnancy. This synthetic progesterone hormone stops the ovaries from releasing eggs. It also thickens cervical mucus and changes the uterine lining. This makes it harder for sperm to survive in the uterus.  Birth control pills. These pills contain estrogen and progesterone hormone.  They work by preventing the ovaries from releasing eggs (ovulation). They also cause the cervical mucus to thicken, preventing the sperm from entering the uterus. Birth control pills are prescribed by a health care provider.Birth control pills can also be used to treat heavy periods.  Minipill. This type of birth control pill contains only the progesterone hormone. They are taken every day of each month and must be prescribed by your health care provider.  Birth control patch. The patch contains hormones similar to those in birth control pills. It must be changed once a week and is prescribed by a health care provider.  Vaginal ring. The ring contains hormones similar to those in birth control pills. It is left in the vagina for 3 weeks, removed for 1 week, and then a new one is put back in place. The patient must be comfortable inserting and removing the ring from the vagina.A health care provider's prescription is necessary.  Emergency contraception. Emergency contraceptives prevent pregnancy after unprotected sexual intercourse. This pill can be taken right after sex or up to 5 days after unprotected sex. It is most effective the sooner you take the pills after having sexual intercourse. Most emergency contraceptive pills are available without a prescription. Check with your pharmacist. Do not use emergency contraception as your only form of birth control. BARRIER METHODS   Female condom. This is a thin sheath (latex or rubber) that is worn over the penis during sexual intercourse. It can be used with spermicide to increase effectiveness.  Female condom. This is a soft, loose-fitting sheath that is put into the vagina before sexual intercourse.  Diaphragm. This is a soft, latex, dome-shaped barrier that must be fitted by a health care provider. It is inserted into the vagina, along with a spermicidal jelly. It is inserted before intercourse. The diaphragm should be left in the vagina for 6 to 8  hours after intercourse.  Cervical cap. This is a round, soft, latex or plastic cup that fits over the cervix and must be fitted by a health care provider. The cap can be left in place for up to 48 hours after intercourse.  Sponge. This is a soft, circular piece of polyurethane foam. The sponge has spermicide in it. It is inserted into the vagina after wetting it and before sexual intercourse.  Spermicides. These are chemicals that kill or block sperm from entering the cervix and uterus. They come in the form of creams, jellies, suppositories, foam, or tablets. They do not require a prescription. They are inserted into the vagina with an applicator before having sexual intercourse. The process must be repeated every time  you have sexual intercourse. INTRAUTERINE CONTRACEPTION  Intrauterine device (IUD). This is a T-shaped device that is put in a woman's uterus during a menstrual period to prevent pregnancy. There are 2 types:  Copper IUD. This type of IUD is wrapped in copper wire and is placed inside the uterus. Copper makes the uterus and fallopian tubes produce a fluid that kills sperm. It can stay in place for 10 years.  Hormone IUD. This type of IUD contains the hormone progestin (synthetic progesterone). The hormone thickens the cervical mucus and prevents sperm from entering the uterus, and it also thins the uterine lining to prevent implantation of a fertilized egg. The hormone can weaken or kill the sperm that get into the uterus. It can stay in place for 3-5 years, depending on which type of IUD is used. PERMANENT METHODS OF CONTRACEPTION  Female tubal ligation. This is when the woman's fallopian tubes are surgically sealed, tied, or blocked to prevent the egg from traveling to the uterus.  Hysteroscopic sterilization. This involves placing a small coil or insert into each fallopian tube. Your doctor uses a technique called hysteroscopy to do the procedure. The device causes scar tissue  to form. This results in permanent blockage of the fallopian tubes, so the sperm cannot fertilize the egg. It takes about 3 months after the procedure for the tubes to become blocked. You must use another form of birth control for these 3 months.  Female sterilization. This is when the female has the tubes that carry sperm tied off (vasectomy).This blocks sperm from entering the vagina during sexual intercourse. After the procedure, the man can still ejaculate fluid (semen). NATURAL PLANNING METHODS  Natural family planning. This is not having sexual intercourse or using a barrier method (condom, diaphragm, cervical cap) on days the woman could become pregnant.  Calendar method. This is keeping track of the length of each menstrual cycle and identifying when you are fertile.  Ovulation method. This is avoiding sexual intercourse during ovulation.  Symptothermal method. This is avoiding sexual intercourse during ovulation, using a thermometer and ovulation symptoms.  Post-ovulation method. This is timing sexual intercourse after you have ovulated. Regardless of which type or method of contraception you choose, it is important that you use condoms to protect against the transmission of sexually transmitted infections (STIs). Talk with your health care provider about which form of contraception is most appropriate for you.   This information is not intended to replace advice given to you by your health care provider. Make sure you discuss any questions you have with your health care provider.   Document Released: 12/05/2005 Document Revised: 12/10/2013 Document Reviewed: 05/30/2013 Elsevier Interactive Patient Education [employer].

## 2016-01-26 ENCOUNTER — Ambulatory Visit (HOSPITAL_COMMUNITY)
Admission: RE | Admit: 2016-01-26 | Discharge: 2016-01-26 | Disposition: A | Discharge status: Home / Self Care | Payer: Self-pay | Source: Ambulatory Visit | Attending: Obstetrics and Gynecology | Admitting: Obstetrics and Gynecology

## 2016-01-26 DIAGNOSIS — R102 Pelvic and perineal pain: Secondary | ICD-10-CM | POA: Insufficient documentation

## 2016-01-26 DIAGNOSIS — N858 Other specified noninflammatory disorders of uterus: Secondary | ICD-10-CM | POA: Insufficient documentation

## 2016-01-26 DIAGNOSIS — G8929 Other chronic pain: Secondary | ICD-10-CM | POA: Insufficient documentation

## 2016-01-27 ENCOUNTER — Telehealth: Payer: Self-pay | Admitting: General Practice

## 2016-01-27 NOTE — Telephone Encounter (Signed)
Per Dr Elly Modena, ultrasound shows a 6 cm fibroid located on top of her uterus, unlikely the source of her pain. She has a cyst on her left ovary which is resolving, the pain could have been due to its previous size.  Patient should consider contraceptive method to help control her heavy cycles and possibly her pain. Called patient, no answer- left message stating we are trying to reach you with non urgent results, please call us back at the clinics

## 2016-01-28 NOTE — Telephone Encounter (Signed)
Patient called and left message stating she is returning our call for results. 

## 2016-01-29 NOTE — Telephone Encounter (Signed)
Pt came to the front desk and I informed her of her results per Dr. Elly Modena recommendations.  I advised pt to make a f/u appt with Dr. Elly Modena to discuss contraception methods that can help with her bleeding.  Pt agreed.

## 2016-02-12 ENCOUNTER — Encounter: Payer: Self-pay | Admitting: Obstetrics and Gynecology

## 2016-02-12 ENCOUNTER — Ambulatory Visit (INDEPENDENT_AMBULATORY_CARE_PROVIDER_SITE_OTHER): Payer: Self-pay | Admitting: Obstetrics and Gynecology

## 2016-02-12 VITALS — BP 152/90 | HR 85 | Temp 98.7°F | Ht 67.0 in | Wt 227.8 lb

## 2016-02-12 DIAGNOSIS — D259 Leiomyoma of uterus, unspecified: Secondary | ICD-10-CM

## 2016-02-12 DIAGNOSIS — Z30011 Encounter for initial prescription of contraceptive pills: Secondary | ICD-10-CM

## 2016-02-12 DIAGNOSIS — Z712 Person consulting for explanation of examination or test findings: Secondary | ICD-10-CM

## 2016-02-12 MED ORDER — NORGESTIMATE-ETH ESTRADIOL 0.25-35 MG-MCG PO TABS: 1.0000 | ORAL_TABLET | Freq: Every day | ORAL | Status: DC

## 2016-02-12 NOTE — Progress Notes (Signed)
Patient ID: Peggy Schmidt, female   DOB: December 11, 1981, 35 y.o.   MRN: [id number] 35 yo here to discuss results. Patient reports some improvement in her lower abdominal pain. She is ready to try contraception to help control her cycle  Past Medical History  Diagnosis Date  . Asthma    Past Surgical History  Procedure Laterality Date  . Ankle fracture surgery     No family history on file. Social History  Substance Use Topics  . Smoking status: Former Research scientist (life sciences)  . Smokeless tobacco: None  . Alcohol Use: No   ROS See pertinent in HPI  Blood pressure 152/90, pulse 85, temperature 98.7 F (37.1 C), temperature source Oral, height 5\' 7"  (1.702 m), weight 227 lb 12.8 oz (103.329 kg), last menstrual period 02/03/2016.   01/26/2016 ultrasound FINDINGS: Uterus  Measurements: 11.6 x 6.8 x 8.1 cm. 6.1 x 4.4 x 5.6 cm fibroid noted in the upper portion of the fundus of the uterus.  Endometrium  Thickness: 31.6 mm. The endometrium is markedly thickened and very heterogeneous. Complex fluid collections in the endometrial canal.  Right ovary  Measurements: 4.5 x 1.9 x 2.6 cm. Normal appearance/no adnexal mass.  Left ovary  Measurements: 4.4 x 2.6 x 3.9 cm. 2.2 cm complex cysts which appears collapse. This is most likely a collapsed corpus luteal cyst.  Other findings  No abnormal free fluid.  IMPRESSION: 1. Endometrium is severely thickened and very heterogeneous. Complex endometrial canal fluid collection is present. Endometrial malignancy cannot be excluded. If bleeding remains unresponsive to hormonal or medical therapy, focal lesion work-up with sonohysterogram should be considered. Endometrial biopsy should also be considered in pre-menopausal patients at high risk for endometrial carcinoma. (Ref: Radiological Reasoning: Algorithmic Workup of Abnormal Vaginal Bleeding with Endovaginal Sonography and Sonohysterography. AJR 2008; [id number])  2. 6.1 cm uterine  fibroid.   Electronically Signed  By: Marcello Moores Register  On: 01/26/2016 08:35  A/P 35 yo with fibroid uterus and heavy menses - Ultrasound results reviewed - Patient desires medical management with OCP which she has used in the past- Rx Sprintec provided - Patient with HTN- Will monitor closely and have the patient return in 3 months. - Patient will follow up with PCP for further management but plans on lifestyle modification to manage her BP for now - RTC in 3 months

## 2016-02-13 ENCOUNTER — Encounter (HOSPITAL_COMMUNITY): Payer: Self-pay | Admitting: Emergency Medicine

## 2016-02-13 ENCOUNTER — Emergency Department (HOSPITAL_COMMUNITY)
Admission: EM | Admit: 2016-02-13 | Discharge: 2016-02-13 | Disposition: A | Discharge status: Home / Self Care | Payer: Self-pay | Attending: Emergency Medicine | Admitting: Emergency Medicine

## 2016-02-13 DIAGNOSIS — M25562 Pain in left knee: Secondary | ICD-10-CM | POA: Insufficient documentation

## 2016-02-13 DIAGNOSIS — Z86018 Personal history of other benign neoplasm: Secondary | ICD-10-CM | POA: Insufficient documentation

## 2016-02-13 DIAGNOSIS — E669 Obesity, unspecified: Secondary | ICD-10-CM | POA: Insufficient documentation

## 2016-02-13 DIAGNOSIS — Z87891 Personal history of nicotine dependence: Secondary | ICD-10-CM | POA: Insufficient documentation

## 2016-02-13 DIAGNOSIS — Z793 Long term (current) use of hormonal contraceptives: Secondary | ICD-10-CM | POA: Insufficient documentation

## 2016-02-13 DIAGNOSIS — J45909 Unspecified asthma, uncomplicated: Secondary | ICD-10-CM | POA: Insufficient documentation

## 2016-02-13 DIAGNOSIS — M25561 Pain in right knee: Secondary | ICD-10-CM | POA: Insufficient documentation

## 2016-02-13 MED ORDER — IBUPROFEN 800 MG PO TABS: 800.0000 mg | ORAL_TABLET | Freq: Three times a day (TID) | ORAL | Status: DC

## 2016-02-13 MED ORDER — IBUPROFEN 800 MG PO TABS
800.0000 mg | ORAL_TABLET | Freq: Once | ORAL | Status: AC
  Administered 2016-02-13: 800 mg via ORAL
  Filled 2016-02-13: qty 1

## 2016-02-13 MED ORDER — METHOCARBAMOL 500 MG PO TABS: 500.0000 mg | ORAL_TABLET | Freq: Two times a day (BID) | ORAL | Status: DC

## 2016-02-13 NOTE — ED Notes (Signed)
Pt c/o B/L leg pain x 1 week. Pt reports pain started in right and felt like fluid was in it. Pain now in left leg. Pt denies shortness of breath or chest pain. Pt drives a bus for a living.

## 2016-02-13 NOTE — Discharge Instructions (Signed)
Ms. Peggy Schmidt,  Nice meeting you! Please follow-up with your primary care provider. Return to the emergency department if you develop chest pain, shortness of breath, your knee pain worsens, you develop fevers/chills. Feel better soon!  [name], PA-C  Emergency Department Resource Guide 1) Find a Doctor and Pay Out of Pocket Although you won't have to find out who is covered by your insurance plan, it is a good idea to ask around and get recommendations. You will then need to call the office and see if the doctor you have chosen will accept you as a new patient and what types of options they offer for patients who are self-pay. Some doctors offer discounts or will set up payment plans for their patients who do not have insurance, but you will need to ask so you aren't surprised when you get to your appointment.  2) Contact Your Local Health Department Not all health departments have doctors that can see patients for sick visits, but many do, so it is worth a call to see if yours does. If you don't know where your local health department is, you can check in your phone book. The CDC also has a tool to help you locate your state's health department, and many state websites also have listings of all of their local health departments.  3) Find a [facility] If your illness is not likely to be very severe or complicated, you may want to try a walk in clinic. These are popping up all over the country in pharmacies, drugstores, and shopping centers. They're usually staffed by nurse practitioners or physician assistants that have been trained to treat common illnesses and complaints. They're usually fairly quick and inexpensive. However, if you have serious medical issues or chronic medical problems, these are probably not your best option.  No Primary Care Doctor: - Call Health Connect at  [phone] - they can help you locate a primary care doctor that  accepts your insurance, provides  certain services, etc. - Physician Referral Service- [phone]  Chronic Pain Problems: Organization         Address  Phone   Notes  [facility]  [phone] Patients need to be referred by their primary care doctor.   Medication Assistance: Organization         Address  Phone   Notes  [facility] Medication [facility] [address] [place], [place] [address] [phone] --Must be a resident of [facility] -- Must have NO insurance coverage whatsoever (no Medicaid/ Medicare, etc.) -- The pt. MUST have a primary care doctor that directs their care regularly and follows them in the community   MedAssist  [phone]   [employer]  8063163981    Agencies that provide inexpensive medical care: Organization         Address  Phone   Notes  [place]  [phone]   [facility]    [phone]   [facility] [place], [place] [address] [phone]   [place] [address] [place] [phone]   Planned Parenthood    [phone]   [facility]    [phone]   [place] and [place],  Phone:  [phone], Fax:  [phone] Hours of Operation:  9 am - 6 pm, M-F.  Also accepts  Medicaid/Medicare and self-pay.  [facility] for [place] [address], [address], [place] Phone: [phone], Fax: [phone]. Hours of Operation:  8:30 am - 5:30 pm, M-F.  Also accepts Medicaid and self-pay.  [facility] [place] [address], [place] Phone: [phone]   [place], [place], [place] [phone], Ext. [phone] Mondays & Thursdays: 7-9 AM.  First 15 patients are seen on a first come, first serve basis.    [place] Providers:  Organization         Address  Phone   Notes  [facility] [address], [place] [phone] Also accepts self-pay patients.  [facility] [id number] [place], [place]  [phone]   [place], [address], [place] [phone]   [facility] [address] [place] [phone]   Lucianne Lei [address], [place]   [phone] Only accepts [place] Access [place] patients after they have their name applied to their card.   Self-Pay (no insurance) in [facility]:  Organization         Address  Phone   Notes  Sickle Cell Patients, [facility] [place] [phone]   [facility] Urgent Care [place] [phone]   [facility]  [place], [place], [place] [phone]   [facility]/Dr. Osei-Bonsu  [address], [place] or [address], [place] [phone] Phone number for both [place] and [place] locations is the same.  Urgent Medical and [facility] [address], [place] [phone]   [facility] [address], [place] or [address] [phone]   [facility] [address], [place] [phone], phone; [phone], fax Sees patients 1st and 3rd Saturday of every month.  Must not qualify for public or private insurance (i.e. Medicaid, Medicare, [place] Health Choice, Veterans' Benefits)  Household income should be no more than 200% of the poverty level The clinic cannot treat you if you are pregnant or think you are pregnant  Sexually transmitted diseases are not treated at the clinic.    Dental Care: Organization         Address  Phone  Notes  [facility] Department of [facility] [place] [phone]  Accepts children up to age 66 who are enrolled in [place] or [place]; pregnant women with a Medicaid card; and children who have applied for Medicaid or [place] Health Choice, but were declined, whose parents can pay a reduced fee at time of service.  [facility] Department of [facility]  [address], [place] [phone] Accepts children up to age 74 who are enrolled in [place] or [place]; pregnant women with a Medicaid card; and children who have applied for Medicaid or [place] Health Choice, but were declined, whose parents can pay a reduced fee at time of service.  [place] Adult Dental Access PROGRAM  [place] [phone] Patients are seen by appointment only. Walk-ins are not accepted. [place] will see patients 80 years of age and older. Monday - Tuesday (8am-5pm) Most  Wednesdays (8:30-5pm) $30 per visit, cash only  [facility] Access PRO[facility]  [address], [facility] [phone] Patients are seen by appointment only. Walk-ins are not accepted. [place] will see patients 58 years of age and older. One Wednesday Evening (Monthly: Volunteer Based).  $30 per visit, cash only  [place]  [phone] for adults; Children under age 55, call Graduate Pediatric Dentistry at [phone]. Children aged 14-14, please call [phone] to request a pediatric application.  Dental services are provided in all areas of dental care including fillings, crowns and bridges, complete and partial dentures, implants, gum treatment, root canals, and extractions. Preventive care is also provided. Treatment is provided to both adults and children. Patients are selected via a lottery and there is often a waiting list.   [facility] [address] [place]  [phone] [email or web address]   [facility] [address] [place], [place] [phone], Ext. [phone] Second  and Fourth Thursday of each month, opens at 6:30 AM; Clinic ends at 9 AM.  Patients are seen on a first-come first-served basis, and a limited number are seen during each clinic.   [facility]  [address] [place], [place] [phone]   Eligibility Requirements You must have lived in [place], [place], or [place] counties for at least the last three months.   You cannot be eligible for state or federal sponsored [employer], including [employer], [place], or [employer].   You generally cannot be eligible for healthcare insurance through your employer.    How to apply: Eligibility screenings are held every Tuesday and Wednesday afternoon from 1:00 pm until 4:00 pm. You do not need an appointment for the interview!  [facility] [address] [place], [place]   [place]  [place] Department  [place]  [phone]    Behavioral Health Resources in the Community: Intensive Outpatient Programs Organization         Address  Phone  Notes  [place]. [address] [place], [place] [phone]   [facility] [address] [place], [place]   ADS: Alcohol & Drug Svcs [address] [place], [place]   [place] [address]  [place], [place] or [phone]   Substance Abuse Resources Organization         Address  Phone  Notes  Alcohol and Drug Services  [phone]   [place]  6067822040   The [place]   Chinita Pester  [phone]   Residential & Outpatient Substance Abuse Program  [phone]   Psychological Services Organization         Address  Phone  Notes  [facility] [place]  [place]  [phone]   [place] [address], [place] or [phone]    Mobile Crisis Teams Organization         Address  Phone  Notes  [facility]atives, Mobile Crisis Care Unit  [phone]   Assertive Psychotherapeutic Services  [address] [place], [place]   [place] Levels [address], [place] [phone]    Self-Help/Support Groups Organization         Address  Phone  Notes  Mental Health Assoc. of [place] - variety of support groups  [place] Call for more information  Narcotics Anonymous (NA), [facility]aring [facility] [address], [employer] [place]  2 meetings at this location   Special educational needs teacher         Address  Phone  Notes  ASAP Residential Treatment [place],    [place]  [phone]   [facility]  [address] [place] [phone], [place], [place]   [place], [place] [phone] Admissions: 8am-3pm M-F  Incentives Substance [place] [address],    [place], [place] [phone]   [facility] [address] [place], [place], [place]   [facility] [address],  [place], [place]   [facility]ight Programs - Intensive Outpatient [address], [place], [place]   [facility] [place].,  [place], [place] [phone] or [phone]   [facility]idential [facility] Services (RTS) [address], [place], [place] Accepts Medicaid  [facility] [address]  [place] [phone] Substance Abuse/Addiction Treatment   [facility] Organization         Address  Phone  Notes  [facility]  [phone]   Domenic Schwab, PhD [address] [place]lis [place], [place]   [phone] or [phone]   [employer] [place], [place]  [phone]   [facility] [address], [place], [place] [phone] Insurance/Medicaid/sponsorship through [facility] and Families [address], [address]                                    [place], [place] [phone] [place] [address], [place] [phone]    Dr. Adele Schilder  [phone]   [facility] Dept. 1) [address] [place] 2) [place] 3)  [address] [place] [address], [place] [phone]  [phone]   [place] [phone] or [phone] (After Hours)

## 2016-02-13 NOTE — ED Provider Notes (Signed)
CSN: [id number]     Arrival date & time 02/13/16  1132 History   None    Chief Complaint  Patient presents with  . Leg Pain   HPI   Peggy Schmidt is a 35 y.o. female PMH significant for asthma, uterine fibroids presenting with a 1 month history of knee pain. She states her right knee began hurting initially, and then 1 week ago her left knee began hurting as well. She describes her pain as 9 out of 10 pain scale, intermittent, lateral in location, non-radiating, constant. She denies fevers, chills, chest pain, shortness of breath, abdominal pain, nausea, vomiting, exogenous estrogen, recent surgeries, history of blood clots, family history of bleeding disorders, color changes, leg swelling.  Past Medical History  Diagnosis Date  . Asthma    Past Surgical History  Procedure Laterality Date  . Ankle fracture surgery     No family history on file. Social History  Substance Use Topics  . Smoking status: Former Research scientist (life sciences)  . Smokeless tobacco: None  . Alcohol Use: No   OB History    No data available     Review of Systems  Ten systems are reviewed and are negative for acute change except as noted in the HPI  Allergies  Peanut-containing drug products  Home Medications   Prior to Admission medications   Medication Sig Start Date End Date Taking? Authorizing Provider  norgestimate-ethinyl estradiol (ORTHO-CYCLEN,SPRINTEC,PREVIFEM) 0.25-35 MG-MCG tablet Take 1 tablet by mouth daily. 02/12/16   Peggy Constant, MD   BP 140/90 mmHg  Pulse 78  Temp(Src) 98 F (36.7 C) (Oral)  Resp 20  SpO2 98%  LMP 02/03/2016 Physical Exam  Constitutional: She appears well-developed and well-nourished. No distress.  Obese  HENT:  Head: Normocephalic and atraumatic.  Eyes: Conjunctivae are normal. Right eye exhibits no discharge. Left eye exhibits no discharge. No scleral icterus.  Cardiovascular: Normal rate, regular rhythm, normal heart sounds and intact distal pulses.  Exam reveals no gallop  and no friction rub.   No murmur heard. Pulmonary/Chest: Effort normal and breath sounds normal. No respiratory distress. She has no wheezes. She has no rales. She exhibits no tenderness.  Abdominal: She exhibits no distension.  Musculoskeletal: Normal range of motion. She exhibits no edema or tenderness.  Neurovascularly intact BL.  Neurological: She is alert. Coordination normal.  Skin: Skin is warm and dry. No rash noted. She is not diaphoretic. No erythema.  Psychiatric: She has a normal mood and affect. Her behavior is normal.  Nursing note and vitals reviewed.  ED Course  Procedures   MDM   Final diagnoses:  Bilateral knee pain   Patient nontoxic appearing, vital signs stable. O2 saturation 100% on room air. Not tachycardic. Based on patient history and physical exam, most likely etiologies include MSK vs OA. Less likely etiologies include DVT, trauma (fracture, dislocation, hemarthrosis, osteonecrosis, tenosynovitis), infectious (non-GC vs GC septic arthritis, reactive arthritis, tenosynovitis, lyme disease), RA, gout, pseudogout. Will defer imaging at this time. Patient will be sent home with bilateral knee sleeves, high dose ibuprofen and muscle relaxer. Discussed return precautions. Patient in understanding and agreement with the plan. Patient to follow-up with primary care provider within one week.  [place] Lions, PA-C 02/13/16 [place], MD 02/14/16 [phone]

## 2016-04-19 ENCOUNTER — Other Ambulatory Visit: Payer: Self-pay | Admitting: Family Medicine

## 2016-04-19 ENCOUNTER — Ambulatory Visit
Admission: RE | Admit: 2016-04-19 | Discharge: 2016-04-19 | Disposition: A | Discharge status: Home / Self Care | Payer: BLUE CROSS/BLUE SHIELD | Source: Ambulatory Visit | Attending: Family Medicine | Admitting: Family Medicine

## 2016-04-19 DIAGNOSIS — M25561 Pain in right knee: Secondary | ICD-10-CM

## 2016-06-02 ENCOUNTER — Emergency Department (HOSPITAL_COMMUNITY)
Admission: EM | Admit: 2016-06-02 | Discharge: 2016-06-02 | Disposition: A | Discharge status: Home / Self Care | Payer: BLUE CROSS/BLUE SHIELD | Attending: Emergency Medicine | Admitting: Emergency Medicine

## 2016-06-02 ENCOUNTER — Emergency Department (HOSPITAL_COMMUNITY): Payer: BLUE CROSS/BLUE SHIELD

## 2016-06-02 ENCOUNTER — Encounter (HOSPITAL_COMMUNITY): Payer: Self-pay | Admitting: Emergency Medicine

## 2016-06-02 DIAGNOSIS — Z79899 Other long term (current) drug therapy: Secondary | ICD-10-CM | POA: Diagnosis not present

## 2016-06-02 DIAGNOSIS — R05 Cough: Secondary | ICD-10-CM | POA: Diagnosis present

## 2016-06-02 DIAGNOSIS — J45909 Unspecified asthma, uncomplicated: Secondary | ICD-10-CM | POA: Insufficient documentation

## 2016-06-02 DIAGNOSIS — R059 Cough, unspecified: Secondary | ICD-10-CM

## 2016-06-02 DIAGNOSIS — Z91010 Allergy to peanuts: Secondary | ICD-10-CM | POA: Diagnosis not present

## 2016-06-02 DIAGNOSIS — J069 Acute upper respiratory infection, unspecified: Secondary | ICD-10-CM

## 2016-06-02 DIAGNOSIS — R071 Chest pain on breathing: Secondary | ICD-10-CM | POA: Diagnosis not present

## 2016-06-02 DIAGNOSIS — J029 Acute pharyngitis, unspecified: Secondary | ICD-10-CM | POA: Diagnosis not present

## 2016-06-02 DIAGNOSIS — Z87891 Personal history of nicotine dependence: Secondary | ICD-10-CM | POA: Diagnosis not present

## 2016-06-02 DIAGNOSIS — B9789 Other viral agents as the cause of diseases classified elsewhere: Secondary | ICD-10-CM

## 2016-06-02 LAB — RAPID STREP SCREEN (MED CTR MEBANE ONLY): Streptococcus, Group A Screen (Direct): NEGATIVE

## 2016-06-02 MED ORDER — BENZONATATE 100 MG PO CAPS: 100.0000 mg | ORAL_CAPSULE | Freq: Three times a day (TID) | ORAL | Status: DC

## 2016-06-02 NOTE — ED Provider Notes (Signed)
CSN: [id number]     Arrival date & time 06/02/16  1006 History  By signing my name below, I, Essence Howell, attest that this documentation has been prepared under the direction and in the presence of Hyman Bible, PA-C Electronically Signed: Ladene Artist, ED Scribe 06/02/2016 at 10:55 AM.   Chief Complaint  Patient presents with  . Sore Throat  . Cough  . Chest Pain   The history is provided by the patient. No language interpreter was used.   HPI Comments: Peggy Schmidt is a 35 y.o. female, with a h/o childhood asthma, who presents to the Emergency Department complaining of gradually worsening dry cough for the past 2 days. Pt reports associated symptoms of rhinorrhea, sore throat that is exacerbated with swallowing, one episode of post-tussive emesis last night and chest pain only with coughing. She has tried Robitussin last night without significant relief. Pt denies fever, SOB, difficulty swallowing. She is an occasional smoker.   Past Medical History  Diagnosis Date  . Asthma    Past Surgical History  Procedure Laterality Date  . Ankle fracture surgery     No family history on file. Social History  Substance Use Topics  . Smoking status: Former Research scientist (life sciences)  . Smokeless tobacco: Not on file  . Alcohol Use: No   OB History    No data available     Review of Systems  All other systems reviewed and are negative.  Allergies  Peanut-containing drug products  Home Medications   Prior to Admission medications   Medication Sig Start Date End Date Taking? Authorizing Provider  ibuprofen (ADVIL,MOTRIN) 800 MG tablet Take 1 tablet (800 mg total) by mouth 3 (three) times daily. 02/13/16   [place] Lions, PA-C  methocarbamol (ROBAXIN) 500 MG tablet Take 1 tablet (500 mg total) by mouth 2 (two) times daily. 02/13/16   [place] Lions, PA-C  norgestimate-ethinyl estradiol (ORTHO-CYCLEN,SPRINTEC,PREVIFEM) 0.25-35 MG-MCG tablet Take 1 tablet by mouth daily. 02/12/16   Peggy  Constant, MD   BP 144/92 mmHg  Pulse 94  Temp(Src) 98.7 F (37.1 C) (Oral)  Resp 18  SpO2 100% Physical Exam  Constitutional: She is oriented to person, place, and time. She appears well-developed and well-nourished. No distress.  HENT:  Head: Normocephalic and atraumatic.  Right Ear: Tympanic membrane and ear canal normal.  Left Ear: Tympanic membrane and ear canal normal.  Mouth/Throat: Uvula is midline. No trismus in the jaw. Oropharyngeal exudate present. No posterior oropharyngeal erythema.  Mild bilateral tonsillar enlargement with exudate  Normal voice phonation No drooling, handling secretions well  Eyes: Conjunctivae and EOM are normal.  Neck: Normal range of motion. Neck supple. No tracheal deviation present.  Cardiovascular: Normal rate, regular rhythm and normal heart sounds.   Pulmonary/Chest: Effort normal and breath sounds normal. No respiratory distress. She has no wheezes. She has no rales.  Musculoskeletal: Normal range of motion.  Lymphadenopathy:    She has no cervical adenopathy.  No anterior cervical lymphadenopathy   Neurological: She is alert and oriented to person, place, and time.  Skin: Skin is warm and dry.  Psychiatric: She has a normal mood and affect. Her behavior is normal.  Nursing note and vitals reviewed.  ED Course  Procedures (including critical care time) DIAGNOSTIC STUDIES: Oxygen Saturation is 100% on RA, normal by my interpretation.    COORDINATION OF CARE: 10:19 AM-Discussed treatment plan which includes CXR and strep screen with pt at bedside and pt agreed to plan.   Labs  Review Labs Reviewed  RAPID STREP SCREEN (NOT AT [facility])  CULTURE, GROUP A STREP [facility])   Imaging Review Dg Chest 2 View  06/02/2016  CLINICAL DATA:  Cough. EXAM: CHEST  2 VIEW COMPARISON:  Radiograph of July 23, 2012. FINDINGS: The heart size and mediastinal contours are within normal limits. Both lungs are clear. No pneumothorax or pleural effusion is noted.  The visualized skeletal structures are unremarkable. IMPRESSION: No active cardiopulmonary disease. Electronically Signed   By: Marijo Conception, M.D.   On: 06/02/2016 10:51   I have personally reviewed and evaluated these images and lab results as part of my medical decision-making.   EKG Interpretation None      MDM   Final diagnoses:  None  Patient presents today with cough and sore throat onset 2 days ago.  She does report some chest pain with coughing, but only with coughing.  No SOB.  Rapid strep is negative.  CXR is negative.  No signs of PTA or Retropharyngeal Abscess on exam.  Patient not hypoxic.  No signs of respiratory distress.  Feel that symptoms are most consistent with Viral URI.  Stable for discharge.  Return precautions given.  I personally performed the services described in this documentation, which was scribed in my presence. The recorded information has been reviewed and is accurate.    Hyman Bible, PA-C 06/02/16 [place] Liu, MD 06/03/16 [phone]

## 2016-06-02 NOTE — ED Notes (Signed)
C/o sore throat, frequent cough and CP with cough x 2 days. States vomited x 1 after taking Robitussin last pm.

## 2016-06-04 LAB — CULTURE, GROUP A STREP (THRC)

## 2016-07-30 ENCOUNTER — Encounter (HOSPITAL_COMMUNITY): Payer: Self-pay | Admitting: Emergency Medicine

## 2016-07-30 ENCOUNTER — Ambulatory Visit (INDEPENDENT_AMBULATORY_CARE_PROVIDER_SITE_OTHER): Payer: BLUE CROSS/BLUE SHIELD

## 2016-07-30 ENCOUNTER — Ambulatory Visit (HOSPITAL_COMMUNITY)
Admission: EM | Admit: 2016-07-30 | Discharge: 2016-07-30 | Disposition: A | Discharge status: Home / Self Care | Payer: BLUE CROSS/BLUE SHIELD | Attending: Family Medicine | Admitting: Family Medicine

## 2016-07-30 DIAGNOSIS — M79672 Pain in left foot: Secondary | ICD-10-CM | POA: Diagnosis not present

## 2016-07-30 DIAGNOSIS — M722 Plantar fascial fibromatosis: Secondary | ICD-10-CM

## 2016-07-30 DIAGNOSIS — M24275 Disorder of ligament, left foot: Secondary | ICD-10-CM

## 2016-07-30 NOTE — ED Notes (Signed)
Placed a thin coban layer on left foot per Youlanda Roys, NP

## 2016-07-30 NOTE — ED Provider Notes (Signed)
CSN: [id number]     Arrival date & time 07/30/16  1312 History   First MD Initiated Contact with Patient 07/30/16 1447     Chief Complaint  Patient presents with  . 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 Getting progressively worse. It is located to the plantar aspect primarily along the more proximal arch. 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 This exacerbates the pain. Ambulation and weightbearing exacerbates pain. Rest and nonweightbearing help. Denies any known trauma or singular causative event. She has a history of left ankle surgery decade ago.       Past Medical History:  Diagnosis Date  . Asthma    Past Surgical History:  Procedure Laterality Date  . ANKLE FRACTURE SURGERY     History reviewed. No pertinent family history. Social History  Substance Use Topics  . Smoking status: Current Some Day Smoker  . Smokeless tobacco: Never Used  . Alcohol use No   OB History    No data available     Review of Systems  Constitutional: Negative for activity change, chills and fever.  HENT: Negative.   Respiratory: Negative.   Cardiovascular: Negative.   Musculoskeletal:       As per HPI  Skin: Negative for color change, pallor and rash.  Neurological: Negative.   All other systems reviewed and are negative.   Allergies  Peanut-containing drug products  Home Medications   Prior to Admission medications   Medication Sig Start Date End Date Taking? Authorizing Provider  benzonatate (TESSALON) 100 MG capsule Take 1 capsule (100 mg total) by mouth every 8 (eight) hours. 06/02/16   Heather Laisure, PA-C  ibuprofen (ADVIL,MOTRIN) 800 MG tablet Take 1 tablet (800 mg total) by mouth 3 (three) times daily. 02/13/16   Callaway Lions, PA-C  methocarbamol (ROBAXIN) 500 MG tablet Take 1 tablet (500 mg total) by mouth 2 (two) times daily. 02/13/16   Fulton Lions, PA-C  norgestimate-ethinyl estradiol (ORTHO-CYCLEN,SPRINTEC,PREVIFEM) 0.25-35 MG-MCG tablet Take 1 tablet by mouth daily. 02/12/16   Mora Bellman, MD   Meds Ordered and Administered this Visit  Medications - No data to display  BP 147/99 (BP Location: Left Arm)   Pulse 79   Temp 99 F (37.2 C) (Oral)   Resp 18   SpO2 99%  No data found.   Physical Exam  Constitutional: She is oriented to person, place, and time. She appears well-developed and well-nourished. No distress.  HENT:  Head: Normocephalic and atraumatic.  Eyes: EOM are normal. Pupils are equal, round, and reactive to light.  Neck: Normal range of motion. Neck supple.  Pulmonary/Chest: Effort normal.  Musculoskeletal: Normal range of motion. She exhibits no edema or deformity.  Left foot without apparent swelling or deformity. Normal color and warmth. Patient demonstrates dorsiflexion and plantarflexion without limitation. There is tenderness to the posterior aspect of the arch with deep plantar palpation. Compression of the dorsum of the foot with the examining hand produces tenderness across the proximal foot/metatarsals. Pedal pulse 2+. Distal neurovascular and sensory intact.  Lymphadenopathy:    She has no cervical adenopathy.  Neurological: She is alert and oriented to person, place, and time. No cranial nerve deficit.  Skin: Skin is warm and dry. Capillary refill takes less than 2 seconds. No rash noted. No  erythema.  Psychiatric: She has a normal mood and affect.  Nursing note and vitals reviewed.   Urgent Care Course   Clinical Course    Procedures (including critical care time)  Labs Review Labs Reviewed - No data to display  Imaging Review Dg Foot Complete Left  Result Date: 07/30/2016 CLINICAL DATA:  Patient with foot pain for 3 months. Prior hip surgery. No  known injury. Initial encounter. EXAM: LEFT FOOT - COMPLETE 3+ VIEW COMPARISON:  Foot radiograph 10/28/2014 FINDINGS: Normal anatomic alignment. No evidence for acute fracture or dislocation. Plantar calcaneal spurring. Regional soft tissues are unremarkable. IMPRESSION: No acute osseous abnormality. Electronically Signed   By: Lovey Newcomer M.D.   On: 07/30/2016 15:15     Visual Acuity Review  Right Eye Distance:   Left Eye Distance:   Bilateral Distance:    Right Eye Near:   Left Eye Near:    Bilateral Near:         MDM   1. Foot pain, left   2. Plantar fasciitis of left foot   3. Disorder of ligament of left foot    Utilize shoe inserts full length with high arch supports. Utilize the sports wrap provided for you today for comfort and ligaments support. Ice as discussed, roll your foot over frozen can 2-3 times a day to help with inflammation and the bottom of the feet. Follow-up with podiatrist, call for appointment. Limit use of foot pressure as long as possible.     Janne Napoleon, NP 07/30/16 1546

## 2016-07-30 NOTE — ED Triage Notes (Signed)
Pt c/o pain at the bottom of left foot on set x3 months.  Denies inj/trauma... 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 NAD

## 2016-08-16 ENCOUNTER — Emergency Department (HOSPITAL_COMMUNITY)
Admission: EM | Admit: 2016-08-16 | Discharge: 2016-08-16 | Disposition: A | Discharge status: Home / Self Care | Payer: Worker's Compensation | Attending: Emergency Medicine | Admitting: Emergency Medicine

## 2016-08-16 ENCOUNTER — Encounter (HOSPITAL_COMMUNITY): Payer: Self-pay

## 2016-08-16 ENCOUNTER — Emergency Department (HOSPITAL_COMMUNITY): Payer: Worker's Compensation

## 2016-08-16 DIAGNOSIS — S0990XA Unspecified injury of head, initial encounter: Secondary | ICD-10-CM | POA: Diagnosis not present

## 2016-08-16 DIAGNOSIS — Z91010 Allergy to peanuts: Secondary | ICD-10-CM | POA: Diagnosis not present

## 2016-08-16 DIAGNOSIS — S40012A Contusion of left shoulder, initial encounter: Secondary | ICD-10-CM | POA: Insufficient documentation

## 2016-08-16 DIAGNOSIS — Y92410 Unspecified street and highway as the place of occurrence of the external cause: Secondary | ICD-10-CM | POA: Insufficient documentation

## 2016-08-16 DIAGNOSIS — Y999 Unspecified external cause status: Secondary | ICD-10-CM | POA: Insufficient documentation

## 2016-08-16 DIAGNOSIS — Y939 Activity, unspecified: Secondary | ICD-10-CM | POA: Diagnosis not present

## 2016-08-16 DIAGNOSIS — S161XXA Strain of muscle, fascia and tendon at neck level, initial encounter: Secondary | ICD-10-CM | POA: Insufficient documentation

## 2016-08-16 DIAGNOSIS — F17200 Nicotine dependence, unspecified, uncomplicated: Secondary | ICD-10-CM | POA: Insufficient documentation

## 2016-08-16 DIAGNOSIS — J45909 Unspecified asthma, uncomplicated: Secondary | ICD-10-CM | POA: Insufficient documentation

## 2016-08-16 MED ORDER — IBUPROFEN 800 MG PO TABS: 800.0000 mg | ORAL_TABLET | Freq: Three times a day (TID) | ORAL | 0 refills | Status: DC

## 2016-08-16 MED ORDER — TRAMADOL HCL 50 MG PO TABS: 50.0000 mg | ORAL_TABLET | Freq: Four times a day (QID) | ORAL | 0 refills | Status: DC | PRN

## 2016-08-16 MED ORDER — CYCLOBENZAPRINE HCL 10 MG PO TABS: 10.0000 mg | ORAL_TABLET | Freq: Three times a day (TID) | ORAL | 0 refills | Status: DC | PRN
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
## 2016-11-21 ENCOUNTER — Ambulatory Visit: Payer: Self-pay

## 2016-11-22 ENCOUNTER — Inpatient Hospital Stay (HOSPITAL_COMMUNITY)
Admission: AD | Admit: 2016-11-22 | Discharge: 2016-11-22 | Disposition: A | Discharge status: Home / Self Care | Payer: Self-pay | Source: Ambulatory Visit | Attending: Obstetrics & Gynecology | Admitting: Obstetrics & Gynecology

## 2016-11-22 ENCOUNTER — Encounter (HOSPITAL_COMMUNITY): Payer: Self-pay

## 2016-11-22 DIAGNOSIS — K5900 Constipation, unspecified: Secondary | ICD-10-CM | POA: Insufficient documentation

## 2016-11-22 DIAGNOSIS — Z3202 Encounter for pregnancy test, result negative: Secondary | ICD-10-CM | POA: Insufficient documentation

## 2016-11-22 DIAGNOSIS — R102 Pelvic and perineal pain: Secondary | ICD-10-CM

## 2016-11-22 DIAGNOSIS — G8929 Other chronic pain: Secondary | ICD-10-CM | POA: Insufficient documentation

## 2016-11-22 DIAGNOSIS — D508 Other iron deficiency anemias: Secondary | ICD-10-CM | POA: Insufficient documentation

## 2016-11-22 DIAGNOSIS — F17200 Nicotine dependence, unspecified, uncomplicated: Secondary | ICD-10-CM | POA: Insufficient documentation

## 2016-11-22 DIAGNOSIS — B9689 Other specified bacterial agents as the cause of diseases classified elsewhere: Secondary | ICD-10-CM | POA: Insufficient documentation

## 2016-11-22 DIAGNOSIS — N760 Acute vaginitis: Secondary | ICD-10-CM | POA: Insufficient documentation

## 2016-11-22 HISTORY — DX: Essential (primary) hypertension: I10

## 2016-11-22 LAB — WET PREP, GENITAL
Sperm: NONE SEEN
Trich, Wet Prep: NONE SEEN
Yeast Wet Prep HPF POC: NONE SEEN

## 2016-11-22 LAB — CBC
HCT: 29.3 % — ABNORMAL LOW (ref 36.0–46.0)
Hemoglobin: 8.4 g/dL — ABNORMAL LOW (ref 12.0–15.0)
MCH: 19 pg — ABNORMAL LOW (ref 26.0–34.0)
MCHC: 28.7 g/dL — ABNORMAL LOW (ref 30.0–36.0)
MCV: 66.1 fL — ABNORMAL LOW (ref 78.0–100.0)
Platelets: 455 10*3/uL — ABNORMAL HIGH (ref 150–400)
RBC: 4.43 MIL/uL (ref 3.87–5.11)
RDW: 22.1 % — ABNORMAL HIGH (ref 11.5–15.5)
WBC: 8.2 10*3/uL (ref 4.0–10.5)

## 2016-11-22 LAB — URINALYSIS, MICROSCOPIC (REFLEX)

## 2016-11-22 LAB — URINALYSIS, ROUTINE W REFLEX MICROSCOPIC
Bilirubin Urine: NEGATIVE
Glucose, UA: NEGATIVE mg/dL
Ketones, ur: 15 mg/dL — AB
Nitrite: NEGATIVE
Protein, ur: NEGATIVE mg/dL
Specific Gravity, Urine: 1.02 (ref 1.005–1.030)
pH: 6 (ref 5.0–8.0)

## 2016-11-22 LAB — POCT PREGNANCY, URINE: Preg Test, Ur: NEGATIVE

## 2016-11-22 MED ORDER — METRONIDAZOLE 500 MG PO TABS: 500.0000 mg | ORAL_TABLET | Freq: Two times a day (BID) | ORAL | 0 refills | Status: DC

## 2016-11-22 NOTE — MAU Note (Signed)
Been having abd pain LLQ. Always has the pain, it just comes and goes.  Pain was worse yesterday.  Started having leakage 3 days ago, little odor.  Hx of fibroid

## 2016-11-22 NOTE — MAU Provider Note (Signed)
History     CSN: [id number]  Arrival date and time: 11/22/16 1731   None     Chief Complaint  Patient presents with  . Abdominal Pain  . Vaginal Discharge   HPI   Ms.Peggy Schmidt is a 35 y.o. female No obstetric history on file. Here in MAU with vaginal discharge that is water like and abdominal pain. The abdominal pain started " I have always had this pain due to fibroids". The pain comes and goes. She does not manage the pain " I want the fibroid out".  Patient has no insurance and has not been able to see a PCP or GYN.   OB History    No data available      Past Medical History:  Diagnosis Date  . Asthma   . Hypertension    pt states her blood pressure has been up and down for the last year    Past Surgical History:  Procedure Laterality Date  . ANKLE FRACTURE SURGERY      Family History  Problem Relation Age of Onset  . Hypertension Mother   . Diabetes Mother   . Hypertension Brother   . Hypertension Maternal Grandmother   . Diabetes Father     Social History  Substance Use Topics  . Smoking status: Current Some Day Smoker  . Smokeless tobacco: Never Used  . Alcohol use No    Allergies:  Allergies  Allergen Reactions  . Peanut-Containing Drug Products Anaphylaxis    Prescriptions Prior to Admission  Medication Sig Dispense Refill Last Dose  . cyclobenzaprine (FLEXERIL) 10 MG tablet Take 1 tablet (10 mg total) by mouth 3 (three) times daily as needed for muscle spasms. 20 tablet 0 Past Month at Unknown time  . ibuprofen (ADVIL,MOTRIN) 800 MG tablet Take 1 tablet (800 mg total) by mouth 3 (three) times daily. 21 tablet 0 Past Week at Unknown time  . benzonatate (TESSALON) 100 MG capsule Take 1 capsule (100 mg total) by mouth every 8 (eight) hours. (Patient not taking: Reported on 08/16/2016) 21 capsule 0 Completed Course at Unknown time  . norgestimate-ethinyl estradiol (ORTHO-CYCLEN,SPRINTEC,PREVIFEM) 0.25-35 MG-MCG tablet Take 1 tablet by mouth  daily. (Patient not taking: Reported on 08/16/2016) 1 Package 4 Not Taking at Unknown time  . traMADol (ULTRAM) 50 MG tablet Take 1 tablet (50 mg total) by mouth every 6 (six) hours as needed. (Patient not taking: Reported on 11/22/2016) 15 tablet 0 Not Taking at Unknown time   Results for orders placed or performed during the hospital encounter of 11/22/16 (from the past 48 hour(s))  Urinalysis, Routine w reflex microscopic     Status: Abnormal   Collection Time: 11/22/16  6:33 PM  Result Value Ref Range   Color, Urine YELLOW YELLOW   APPearance CLEAR CLEAR   Specific Gravity, Urine 1.020 1.005 - 1.030   pH 6.0 5.0 - 8.0   Glucose, UA NEGATIVE NEGATIVE mg/dL   Hgb urine dipstick TRACE (A) NEGATIVE   Bilirubin Urine NEGATIVE NEGATIVE   Ketones, ur 15 (A) NEGATIVE mg/dL   Protein, ur NEGATIVE NEGATIVE mg/dL   Nitrite NEGATIVE NEGATIVE   Leukocytes, UA TRACE (A) NEGATIVE  Urinalysis, Microscopic (reflex)     Status: Abnormal   Collection Time: 11/22/16  6:33 PM  Result Value Ref Range   RBC / HPF 0-5 0 - 5 RBC/hpf   WBC, UA 0-5 0 - 5 WBC/hpf   Bacteria, UA FEW (A) NONE SEEN   Squamous Epithelial / LPF  0-5 (A) NONE SEEN   Mucous PRESENT   Pregnancy, urine POC     Status: None   Collection Time: 11/22/16  6:39 PM  Result Value Ref Range   Preg Test, Ur NEGATIVE NEGATIVE    Comment:        THE SENSITIVITY OF THIS METHODOLOGY IS >24 mIU/mL   Wet prep, genital     Status: Abnormal   Collection Time: 11/22/16  7:58 PM  Result Value Ref Range   Yeast Wet Prep HPF POC NONE SEEN NONE SEEN   Trich, Wet Prep NONE SEEN NONE SEEN   Clue Cells Wet Prep HPF POC PRESENT (A) NONE SEEN   WBC, Wet Prep HPF POC MODERATE (A) NONE SEEN    Comment: MANY BACTERIA SEEN   Sperm NONE SEEN   CBC     Status: Abnormal   Collection Time: 11/22/16  8:05 PM  Result Value Ref Range   WBC 8.2 4.0 - 10.5 K/uL   RBC 4.43 3.87 - 5.11 MIL/uL   Hemoglobin 8.4 (L) 12.0 - 15.0 g/dL   HCT 29.3 (L) 36.0 - 46.0 %    MCV 66.1 (L) 78.0 - 100.0 fL   MCH 19.0 (L) 26.0 - 34.0 pg   MCHC 28.7 (L) 30.0 - 36.0 g/dL   RDW 22.1 (H) 11.5 - 15.5 %   Platelets 455 (H) 150 - 400 K/uL    Review of Systems  Constitutional: Negative for chills and fever.  Gastrointestinal: Positive for abdominal pain and constipation. Negative for nausea and vomiting.  Genitourinary: Negative for dysuria and urgency.       Denies vaginal bleeding.   Neurological: Negative for dizziness.  Psychiatric/Behavioral: Nervous/anxious:     Physical Exam   Blood pressure 149/87, pulse 83, temperature 98.2 F (36.8 C), temperature source Oral, resp. rate 18, height 5\' 5"  (1.651 m), weight 243 lb 4 oz (110.3 kg), last menstrual period 11/05/2016.  Physical Exam  Constitutional: She is oriented to person, place, and time. She appears well-developed and well-nourished.  HENT:  Head: Normocephalic.  Eyes: Pupils are equal, round, and reactive to light.  Respiratory: Effort normal.  GI: Soft. Normal appearance. There is tenderness in the suprapubic area. There is no rigidity, no rebound and no guarding.  Genitourinary:  Genitourinary Comments: Wet prep and GCC collected by RN   Musculoskeletal: Normal range of motion.  Neurological: She is alert and oriented to person, place, and time.  Skin: Skin is warm. She is not diaphoretic.  Psychiatric: Her behavior is normal.    MAU Course  Procedures  None  MDM  Wet prep GCC CBC  Assessment and Plan   A:  1. BV (bacterial vaginosis)   2. Other iron deficiency anemia   3. Chronic pelvic pain in female   4. Constipation, unspecified constipation type     P:  Discharge home in stable condition Metamucil/ Miralax for constipation.  Rx: Flagyl  Iron over the counter once constipation is managed  Several PCP office contact information provided for PCP options.  Increase PO fluids Follow up in the [facility] for fibroid management.   Lezlie Lye, NP 11/22/2016 8:49 PM

## 2016-11-22 NOTE — Discharge Instructions (Signed)
Anemia, Nonspecific Anemia is a condition in which the concentration of red blood cells or hemoglobin in the blood is below normal. Hemoglobin is a substance in red blood cells that carries oxygen to the tissues of the body. Anemia results in not enough oxygen reaching these tissues. What are the causes? Common causes of anemia include:  Excessive bleeding. Bleeding may be internal or external. This includes excessive bleeding from periods (in women) or from the intestine.  Poor nutrition.  Chronic kidney, thyroid, and liver disease.  Bone marrow disorders that decrease red blood cell production.  Cancer and treatments for cancer.  HIV, AIDS, and their treatments.  Spleen problems that increase red blood cell destruction.  Blood disorders.  Excess destruction of red blood cells due to infection, medicines, and autoimmune disorders. What are the signs or symptoms?  Minor weakness.  Dizziness.  Headache.  Palpitations.  Shortness of breath, especially with exercise.  Paleness.  Cold sensitivity.  Indigestion.  Nausea.  Difficulty sleeping.  Difficulty concentrating. Symptoms may occur suddenly or they may develop slowly. How is this diagnosed? Additional blood tests are often needed. These help your health care provider determine the best treatment. Your health care provider will check your stool for blood and look for other causes of blood loss. How is this treated? Treatment varies depending on the cause of the anemia. Treatment can include:  Supplements of iron, vitamin 123456, or folic acid.  Hormone medicines.  A blood transfusion. This may be needed if blood loss is severe.  Hospitalization. This may be needed if there is significant continual blood loss.  Dietary changes.  Spleen removal. Follow these instructions at home: Keep all follow-up appointments. It often takes many weeks to correct anemia, and having your health care provider check on your  condition and your response to treatment is very important. Get help right away if:  You develop extreme weakness, shortness of breath, or chest pain.  You become dizzy or have trouble concentrating.  You develop heavy vaginal bleeding.  You develop a rash.  You have bloody or black, tarry stools.  You faint.  You vomit up blood.  You vomit repeatedly.  You have abdominal pain.  You have a fever or persistent symptoms for more than 2-3 days.  You have a fever and your symptoms suddenly get worse.  You are dehydrated. This information is not intended to replace advice given to you by your health care provider. Make sure you discuss any questions you have with your health care provider. Document Released: 01/12/2005 Document Revised: 05/18/2016 Document Reviewed: 05/31/2013 Elsevier Interactive Patient Education  2017 [employer]  Bacterial Vaginosis Bacterial vaginosis is an infection of the vagina. It happens when too many germs (bacteria) grow in the vagina. This infection puts you at risk for infections from sex (STIs). Treating this infection can lower your risk for some STIs. You should also treat this if you are pregnant. It can cause your baby to be born early. Follow these instructions at home: Medicines  Take over-the-counter and prescription medicines only as told by your doctor.  Take or use your antibiotic medicine as told by your doctor. Do not stop taking or using it even if you start to feel better. General instructions  If you your sexual partner is a woman, tell her that you have this infection. She needs to get treatment if she has symptoms. If you have a female partner, he does not need to be treated.  During treatment:  Avoid  sex.  Do not douche.  Avoid alcohol as told.  Avoid breastfeeding as told.  Drink enough fluid to keep your pee (urine) clear or pale yellow.  Keep your vagina and butt (rectum) clean.  Wash the area with warm water  every day.  Wipe from front to back after you use the toilet.  Keep all follow-up visits as told by your doctor. This is important. Preventing this condition  Do not douche.  Use only warm water to wash around your vagina.  Use protection when you have sex. This includes:  Latex condoms.  Dental dams.  Limit how many people you have sex with. It is best to only have sex with the same person (be monogamous).  Get tested for STIs. Have your partner get tested.  Wear underwear that is cotton or lined with cotton.  Avoid tight pants and pantyhose. This is most important in summer.  Do not use any products that have nicotine or tobacco in them. These include cigarettes and e-cigarettes. If you need help quitting, ask your doctor.  Do not use illegal drugs.  Limit how much alcohol you drink. Contact a doctor if:  Your symptoms do not get better, even after you are treated.  You have more discharge or pain when you pee (urinate).  You have a fever.  You have pain in your belly (abdomen).  You have pain with sex.  Your bleed from your vagina between periods. Summary  This infection happens when too many germs (bacteria) grow in the vagina.  Treating this condition can lower your risk for some infections from sex (STIs).  You should also treat this if you are pregnant. It can cause early (premature) birth.  Do not stop taking or using your antibiotic medicine even if you start to feel better. This information is not intended to replace advice given to you by your health care provider. Make sure you discuss any questions you have with your health care provider. Document Released: 09/13/2008 Document Revised: 08/20/2016 Document Reviewed: 08/20/2016 Elsevier Interactive Patient Education  2017 [employer].

## 2016-11-23 LAB — GC/CHLAMYDIA PROBE AMP (~~LOC~~) NOT AT ARMC
Chlamydia: NEGATIVE
Neisseria Gonorrhea: NEGATIVE

## 2016-12-11 ENCOUNTER — Emergency Department (HOSPITAL_COMMUNITY)
Admission: EM | Admit: 2016-12-11 | Discharge: 2016-12-11 | Disposition: A | Discharge status: Home / Self Care | Payer: Self-pay | Attending: Emergency Medicine | Admitting: Emergency Medicine

## 2016-12-11 ENCOUNTER — Encounter (HOSPITAL_COMMUNITY): Payer: Self-pay

## 2016-12-11 DIAGNOSIS — Z91010 Allergy to peanuts: Secondary | ICD-10-CM | POA: Insufficient documentation

## 2016-12-11 DIAGNOSIS — J45909 Unspecified asthma, uncomplicated: Secondary | ICD-10-CM | POA: Insufficient documentation

## 2016-12-11 DIAGNOSIS — J069 Acute upper respiratory infection, unspecified: Secondary | ICD-10-CM | POA: Insufficient documentation

## 2016-12-11 DIAGNOSIS — F17200 Nicotine dependence, unspecified, uncomplicated: Secondary | ICD-10-CM | POA: Insufficient documentation

## 2016-12-11 DIAGNOSIS — I10 Essential (primary) hypertension: Secondary | ICD-10-CM | POA: Insufficient documentation

## 2016-12-11 NOTE — ED Provider Notes (Signed)
[facility] DEPT Provider Note   CSN: [id number] Arrival date & time: 12/11/16  [id number]     History   Chief Complaint Chief Complaint  Patient presents with  . URI  . Sore Throat  . Otalgia    HPI Peggy Schmidt is a 35 y.o. female.  86 of female presents with 3-4 day history of URI symptoms consisting of left ear pain with some sore throat without cough or congestion. Using over-the-counter medications up relief. Denies any fever or chills. No headache or neck pain. No photophobia. No vomiting or diarrhea. Does note positive sick exposures. Nothing makes her symptoms better.      Past Medical History:  Diagnosis Date  . Asthma   . Hypertension    pt states her blood pressure has been up and down for the last year    Patient Active Problem List   Diagnosis Date Noted  . Fibroid uterus 01/15/2016    Past Surgical History:  Procedure Laterality Date  . ANKLE FRACTURE SURGERY      OB History    No data available       Home Medications    Prior to Admission medications   Medication Sig Start Date End Date Taking? Authorizing Provider  cyclobenzaprine (FLEXERIL) 10 MG tablet Take 1 tablet (10 mg total) by mouth 3 (three) times daily as needed for muscle spasms. 08/16/16   Orpah Greek, MD  ibuprofen (ADVIL,MOTRIN) 800 MG tablet Take 1 tablet (800 mg total) by mouth 3 (three) times daily. 08/16/16   Orpah Greek, MD  metroNIDAZOLE (FLAGYL) 500 MG tablet Take 1 tablet (500 mg total) by mouth 2 (two) times daily. 11/22/16   Lezlie Lye, NP    Family History Family History  Problem Relation Age of Onset  . Hypertension Mother   . Diabetes Mother   . Hypertension Brother   . Hypertension Maternal Grandmother   . Diabetes Father     Social History Social History  Substance Use Topics  . Smoking status: Current Some Day Smoker  . Smokeless tobacco: Never Used  . Alcohol use No     Allergies   Peanut-containing drug  products   Review of Systems Review of Systems  All other systems reviewed and are negative.    Physical Exam Updated Vital Signs BP (!) 145/111 (BP Location: Left Arm)   Pulse 96   Temp 99 F (37.2 C) (Oral)   Resp 18   Ht 5\' 5"  (1.651 m)   Wt 110.2 kg   LMP 12/03/2016   SpO2 99%   BMI 40.44 kg/m   Physical Exam  Constitutional: She is oriented to person, place, and time. She appears well-developed and well-nourished.  Non-toxic appearance. No distress.  HENT:  Head: Normocephalic and atraumatic.  Eyes: Conjunctivae, EOM and lids are normal. Pupils are equal, round, and reactive to light.  Neck: Normal range of motion. Neck supple. No tracheal deviation present. No thyroid mass present.  Cardiovascular: Normal rate, regular rhythm and normal heart sounds.  Exam reveals no gallop.   No murmur heard. Pulmonary/Chest: Effort normal and breath sounds normal. No stridor. No respiratory distress. She has no decreased breath sounds. She has no wheezes. She has no rhonchi. She has no rales.  Abdominal: Soft. Normal appearance and bowel sounds are normal. She exhibits no distension. There is no tenderness. There is no rebound and no CVA tenderness.  Musculoskeletal: Normal range of motion. She exhibits no edema or tenderness.  Neurological: She  is alert and oriented to person, place, and time. She has normal strength. No cranial nerve deficit or sensory deficit. GCS eye subscore is 4. GCS verbal subscore is 5. GCS motor subscore is 6.  Skin: Skin is warm and dry. No abrasion and no rash noted.  Psychiatric: She has a normal mood and affect. Her speech is normal and behavior is normal.  Nursing note and vitals reviewed.    ED Treatments / Results  Labs (all labs ordered are listed, but only abnormal results are displayed) Labs Reviewed - No data to display  EKG  EKG Interpretation None       Radiology No results found.  Procedures Procedures (including critical care  time)  Medications Ordered in ED Medications - No data to display   Initial Impression / Assessment and Plan / ED Course  I have reviewed the triage vital signs and the nursing notes.  Pertinent labs & imaging results that were available during my care of the patient were reviewed by me and considered in my medical decision making (see chart for details).  Clinical Course     Patient with likely viral illness and stable for discharge  Final Clinical Impressions(s) / ED Diagnoses   Final diagnoses:  None    New Prescriptions New Prescriptions   No medications on file     Lacretia Leigh, MD 12/11/16 1016

## 2016-12-11 NOTE — ED Triage Notes (Signed)
Pt c/o URI symptoms, headache, L ear pain, and sore throat x 3-4 days.  Pain score 10/10.  Pt reports taking OTC medications w/o relief.

## 2016-12-11 NOTE — Discharge Instructions (Signed)
Use Mucinex as well as decongestants over-the-counter as needed

## 2017-01-14 ENCOUNTER — Emergency Department (HOSPITAL_COMMUNITY)
Admission: EM | Admit: 2017-01-14 | Discharge: 2017-01-14 | Disposition: A | Discharge status: Home / Self Care | Payer: Self-pay | Attending: Emergency Medicine | Admitting: Emergency Medicine

## 2017-01-14 ENCOUNTER — Encounter (HOSPITAL_COMMUNITY): Payer: Self-pay

## 2017-01-14 DIAGNOSIS — Z79899 Other long term (current) drug therapy: Secondary | ICD-10-CM | POA: Insufficient documentation

## 2017-01-14 DIAGNOSIS — J45909 Unspecified asthma, uncomplicated: Secondary | ICD-10-CM | POA: Insufficient documentation

## 2017-01-14 DIAGNOSIS — M25562 Pain in left knee: Secondary | ICD-10-CM | POA: Insufficient documentation

## 2017-01-14 DIAGNOSIS — M25561 Pain in right knee: Secondary | ICD-10-CM | POA: Insufficient documentation

## 2017-01-14 DIAGNOSIS — I10 Essential (primary) hypertension: Secondary | ICD-10-CM | POA: Insufficient documentation

## 2017-01-14 DIAGNOSIS — G8929 Other chronic pain: Secondary | ICD-10-CM | POA: Insufficient documentation

## 2017-01-14 DIAGNOSIS — F17200 Nicotine dependence, unspecified, uncomplicated: Secondary | ICD-10-CM | POA: Insufficient documentation

## 2017-01-14 DIAGNOSIS — Z91010 Allergy to peanuts: Secondary | ICD-10-CM | POA: Insufficient documentation

## 2017-01-14 MED ORDER — IBUPROFEN 800 MG PO TABS
800.0000 mg | ORAL_TABLET | Freq: Once | ORAL | Status: AC
  Administered 2017-01-14: 800 mg via ORAL
  Filled 2017-01-14: qty 1

## 2017-01-14 MED ORDER — IBUPROFEN 800 MG PO TABS: 800.0000 mg | ORAL_TABLET | Freq: Three times a day (TID) | ORAL | 0 refills | Status: DC

## 2017-01-14 NOTE — ED Triage Notes (Signed)
Patient here with months of bilateral knee pain. Pain with with ambulation and movement, no known trauma

## 2017-01-14 NOTE — ED Provider Notes (Signed)
[facility] DEPT Provider Note   CSN: [id number] Arrival date & time: 01/14/17  1032  By signing my name below, I, Jaquelyn Bitter., attest that this documentation has been prepared under the direction and in the presence of Merryl Hacker, MD. Electronically signed: Jaquelyn Bitter., ED Scribe. 01/14/17. 12:36 PM.    History   Chief Complaint No chief complaint on file.   HPI  Peggy Schmidt is a 36 y.o. female with hx of HTN who presents to the Emergency Department complaining of constant, mild to moderate bilateral knee pain with sudden onset x2 months. Pt states that she has had worsening bilateral knee pain for the past x2 months. Pt rates the pain about 10/10. Pt was seen at [place] and had X-rays done which were normal. Pt is able to ambulate but states that the pain is worse when she goes upstairs. Pt has taken 800mg  Tylenol  and Ibuprofen with mild relief. Pt denies numbness, tingling in feet, fever, known injury. Of note, pt states that her feet were "tight" last night, but denies swelling.    The history is provided by the patient. No language interpreter was used.    Past Medical History:  Diagnosis Date  . Asthma   . Hypertension    pt states her blood pressure has been up and down for the last year    Patient Active Problem List   Diagnosis Date Noted  . Fibroid uterus 01/15/2016    Past Surgical History:  Procedure Laterality Date  . ANKLE FRACTURE SURGERY      OB History    No data available       Home Medications    Prior to Admission medications   Medication Sig Start Date End Date Taking? Authorizing Provider  cyclobenzaprine (FLEXERIL) 10 MG tablet Take 1 tablet (10 mg total) by mouth 3 (three) times daily as needed for muscle spasms. 08/16/16   Orpah Greek, MD  ibuprofen (ADVIL,MOTRIN) 800 MG tablet Take 1 tablet (800 mg total) by mouth 3 (three) times daily. 01/14/17   Merryl Hacker, MD    metroNIDAZOLE (FLAGYL) 500 MG tablet Take 1 tablet (500 mg total) by mouth 2 (two) times daily. 11/22/16   Lezlie Lye, NP    Family History Family History  Problem Relation Age of Onset  . Hypertension Mother   . Diabetes Mother   . Hypertension Brother   . Hypertension Maternal Grandmother   . Diabetes Father     Social History Social History  Substance Use Topics  . Smoking status: Current Some Day Smoker  . Smokeless tobacco: Never Used  . Alcohol use No     Allergies   Peanut-containing drug products   Review of Systems Review of Systems  Constitutional: Negative for fever.  Musculoskeletal: Positive for arthralgias (bilateral knees).  Neurological: Negative for weakness and numbness.  All other systems reviewed and are negative.    Physical Exam Updated Vital Signs BP 135/99 (BP Location: Right Arm)   Pulse 76   Temp 98.4 F (36.9 C) (Oral)   Resp 20   SpO2 100%   Physical Exam  Constitutional: She is oriented to person, place, and time. She appears well-developed and well-nourished.  overweight  HENT:  Head: Normocephalic and atraumatic.  Cardiovascular: Normal rate and regular rhythm.   Pulmonary/Chest: Effort normal. No respiratory distress.  Musculoskeletal: Normal range of motion. She exhibits no edema.  Normal range of motion of bilateral knees, crepitus  noted bilaterally, no significant effusion, no overlying skin changes or erythema, no warmth noted, no deformities, no joint line pain  Neurological: She is alert and oriented to person, place, and time.  Skin: Skin is warm and dry.  Psychiatric: She has a normal mood and affect.  Nursing note and vitals reviewed.    ED Treatments / Results   DIAGNOSTIC STUDIES: Oxygen Saturation is 100% on RA, normal by my interpretation.   COORDINATION OF CARE: 12:36 PM-Discussed next steps with pt. Pt verbalized understanding and is agreeable with the plan.    Labs (all labs ordered are  listed, but only abnormal results are displayed) Labs Reviewed - No data to display  EKG  EKG Interpretation None       Radiology No results found.  Procedures Procedures (including critical care time)  Medications Ordered in ED Medications  ibuprofen (ADVIL,MOTRIN) tablet 800 mg (not administered)     Initial Impression / Assessment and Plan / ED Course  I have reviewed the triage vital signs and the nursing notes.  Pertinent labs & imaging results that were available during my care of the patient were reviewed by me and considered in my medical decision making (see chart for details).     Patient presents with bilateral knee pain. Acute on chronic. She has crepitus on exam but otherwise normal. Low suspicion at this time for septic joint. Likely arthritis which is exacerbated by weight and ambulation. She does have some improvement with ibuprofen. I have encouraged scheduled ibuprofen for the next 3-5 days. Ice and elevation as needed. Orthopedic follow-up if symptoms persist. I did discuss with patient that she would likely benefit from weight loss Patient stated understanding.  After history, exam, and medical workup I feel the patient has been appropriately medically screened and is safe for discharge home. Pertinent diagnoses were discussed with the patient. Patient was given return precautions.   Final Clinical Impressions(s) / ED Diagnoses   Final diagnoses:  Chronic pain of both knees    New Prescriptions New Prescriptions   IBUPROFEN (ADVIL,MOTRIN) 800 MG TABLET    Take 1 tablet (800 mg total) by mouth 3 (three) times daily.   I personally performed the services described in this documentation, which was scribed in my presence. The recorded information has been reviewed and is accurate.     Merryl Hacker, MD 01/14/17 [phone]

## 2017-01-14 NOTE — Discharge Instructions (Signed)
You were seen today for knee pain. Given how long this has been ongoing, this is likely related to arthritis. Use ibuprofen scheduled for the next 3-4 days to see if you improved. You may benefit from elevation and ice as needed. You may also benefit from weight loss. If you have progressive or persistent symptoms, you will be given orthopedic follow-up.

## 2017-02-23 ENCOUNTER — Emergency Department (HOSPITAL_COMMUNITY)
Admission: EM | Admit: 2017-02-23 | Discharge: 2017-02-23 | Disposition: A | Discharge status: Home / Self Care | Payer: Self-pay | Attending: Emergency Medicine | Admitting: Emergency Medicine

## 2017-02-23 ENCOUNTER — Encounter (HOSPITAL_COMMUNITY): Payer: Self-pay | Admitting: Emergency Medicine

## 2017-02-23 DIAGNOSIS — K029 Dental caries, unspecified: Secondary | ICD-10-CM | POA: Insufficient documentation

## 2017-02-23 DIAGNOSIS — F17200 Nicotine dependence, unspecified, uncomplicated: Secondary | ICD-10-CM | POA: Insufficient documentation

## 2017-02-23 DIAGNOSIS — K0889 Other specified disorders of teeth and supporting structures: Secondary | ICD-10-CM | POA: Insufficient documentation

## 2017-02-23 DIAGNOSIS — Z79899 Other long term (current) drug therapy: Secondary | ICD-10-CM | POA: Insufficient documentation

## 2017-02-23 DIAGNOSIS — I10 Essential (primary) hypertension: Secondary | ICD-10-CM | POA: Insufficient documentation

## 2017-02-23 DIAGNOSIS — J45909 Unspecified asthma, uncomplicated: Secondary | ICD-10-CM | POA: Insufficient documentation

## 2017-02-23 DIAGNOSIS — Z91010 Allergy to peanuts: Secondary | ICD-10-CM | POA: Insufficient documentation

## 2017-02-23 MED ORDER — PENICILLIN V POTASSIUM 500 MG PO TABS: 500.0000 mg | ORAL_TABLET | Freq: Three times a day (TID) | ORAL | 0 refills | Status: DC

## 2017-02-23 MED ORDER — IBUPROFEN 800 MG PO TABS: 800.0000 mg | ORAL_TABLET | Freq: Three times a day (TID) | ORAL | 0 refills | Status: DC

## 2017-02-23 NOTE — ED Triage Notes (Signed)
Pt has a cavity in her bottom left molar that has broken off and causing pain.

## 2017-02-23 NOTE — ED Provider Notes (Signed)
[facility] DEPT Provider Note   CSN: [id number] Arrival date & time: 02/23/17  1246  By signing my name below, I, Peggy Schmidt, attest that this documentation has been prepared under the direction and in the presence of Peggy Geiple PA-C . Electronically Signed: Higinio Schmidt, Scribe. 02/23/2017. 1:55 PM.  History   Chief Complaint Chief Complaint  Patient presents with  . Dental Pain   The history is provided by the patient. No language interpreter was used.   HPI Comments: Peggy Schmidt is a 36 y.o. female with PMHx of HTN, who presents to the Emergency Department complaining of gradually worsening, left lower dental pain that began ~1.5 weeks ago. Pt reports her pain intermittently radiates into her left ear with associated left-sided facial swelling. She notes she has a cavity in her left lower molar that has now "broken off." She also states hx of dental infections and reports her current pain feels similar. Pt denies any other complaints.   Past Medical History:  Diagnosis Date  . Asthma   . Hypertension    pt states her blood pressure has been up and down for the last year   Patient Active Problem List   Diagnosis Date Noted  . Fibroid uterus 01/15/2016   Past Surgical History:  Procedure Laterality Date  . ANKLE FRACTURE SURGERY     OB History    No data available     Home Medications    Prior to Admission medications   Medication Sig Start Date End Date Taking? Authorizing Provider  cyclobenzaprine (FLEXERIL) 10 MG tablet Take 1 tablet (10 mg total) by mouth 3 (three) times daily as needed for muscle spasms. 08/16/16   Orpah Greek, MD  ibuprofen (ADVIL,MOTRIN) 800 MG tablet Take 1 tablet (800 mg total) by mouth 3 (three) times daily. 01/14/17   Merryl Hacker, MD  metroNIDAZOLE (FLAGYL) 500 MG tablet Take 1 tablet (500 mg total) by mouth 2 (two) times daily. 11/22/16   Lezlie Lye, NP    Family History Family History  Problem Relation Age of Onset    . Hypertension Mother   . Diabetes Mother   . Hypertension Brother   . Hypertension Maternal Grandmother   . Diabetes Father    Social History Social History  Substance Use Topics  . Smoking status: Current Some Day Smoker  . Smokeless tobacco: Never Used  . Alcohol use No   Allergies   Peanut-containing drug products  Review of Systems Review of Systems  Constitutional: Negative for fever.  HENT: Positive for dental problem, ear pain and facial swelling. Negative for sore throat and trouble swallowing.   Respiratory: Negative for shortness of breath and stridor.   Musculoskeletal: Negative for neck pain.  Skin: Negative for color change.  Neurological: Negative for headaches.   Physical Exam Updated Vital Signs BP (!) 139/102 (BP Location: Right Arm)   Pulse 100   Temp 98.6 F (37 C) (Oral)   Resp 16   Ht 5\' 5"  (1.651 m)   Wt 243 lb (110.2 kg)   SpO2 98%   BMI 40.44 kg/m   Physical Exam  Constitutional: She is oriented to person, place, and time. She appears well-developed and well-nourished.  HENT:  Head: Normocephalic and atraumatic.  Right Ear: Tympanic membrane, external ear and ear canal normal.  Left Ear: Tympanic membrane, external ear and ear canal normal.  Nose: Nose normal.  Mouth/Throat: Uvula is midline, oropharynx is clear and moist and mucous membranes are normal.  No trismus in the jaw. Abnormal dentition. Dental caries present. No dental abscesses or uvula swelling. No tonsillar abscesses.  Patient with tooth #21 that is broken tooth gumline. There is mild swelling without induration at the base of the tooth consistent with common infection. No drainable abscess.  Eyes: Conjunctivae and EOM are normal.  Neck: Normal range of motion. Neck supple.  No neck swelling or Ludwig's angina  Pulmonary/Chest: Effort normal.  Abdominal: She exhibits no distension.  Musculoskeletal: Normal range of motion.  Lymphadenopathy:    She has no cervical  adenopathy.  Neurological: She is alert and oriented to person, place, and time.  Skin: Skin is warm and dry.  Psychiatric: She has a normal mood and affect.  Nursing note and vitals reviewed.  ED Treatments / Results  DIAGNOSTIC STUDIES:  Oxygen Saturation is 98% on RA, normal by my interpretation.    COORDINATION OF CARE:  1:52 PM Discussed treatment Schmidt with pt at bedside which includes penicillin and pt agreed to Schmidt.  Procedures Procedures (including critical care time)  Medications Ordered in ED Medications - No data to display  Initial Impression / Assessment and Schmidt / ED Course  I have reviewed the triage vital signs and the nursing notes.  Pertinent labs & imaging results that were available during my care of the patient were reviewed by me and considered in my medical decision making (see chart for details).     Patient with dentalgia.  No abscess requiring immediate incision and drainage.  Exam not concerning for Ludwig's angina or pharyngeal abscess.  Will treat with Penicillin. Pt instructed to follow-up with dentist.  Discussed return precautions. Pt safe for discharge.  I personally performed the services described in this documentation, which was scribed in my presence. The recorded information has been reviewed and is accurate.   Final Clinical Impressions(s) / ED Diagnoses   Final diagnoses:  Pain, dental    New Prescriptions New Prescriptions   IBUPROFEN (ADVIL,MOTRIN) 800 MG TABLET    Take 1 tablet (800 mg total) by mouth 3 (three) times daily.   PENICILLIN V POTASSIUM (VEETID) 500 MG TABLET    Take 1 tablet (500 mg total) by mouth 3 (three) times daily.     Peggy Cater, PA-C 02/23/17 [place], MD 02/23/17 Peggy Schmidt

## 2017-02-23 NOTE — Discharge Instructions (Signed)
Please read and follow all provided instructions.  Your diagnoses today include:  1. Pain, dental    The exam and treatment you received today has been provided on an emergency basis only. This is not a substitute for complete medical or dental care.  Tests performed today include:  Vital signs. See below for your results today.   Medications prescribed:   Penicillin - antibiotic  You have been prescribed an antibiotic medicine: take the entire course of medicine even if you are feeling better. Stopping early can cause the antibiotic not to work.   Ibuprofen (Motrin, Advil) - anti-inflammatory pain medication  Do not exceed 600mg  ibuprofen every 6 hours, take with food  You have been prescribed an anti-inflammatory medication or NSAID. Take with food. Take smallest effective dose for the shortest duration needed for your pain. Stop taking if you experience stomach pain or vomiting.   Take any prescribed medications only as directed.  Home care instructions:  Follow any educational materials contained in this packet.  Follow-up instructions: Please follow-up with your dentist for further evaluation of your symptoms.   Dental Assistance: See attached dental referrals  Return instructions:   Please return to the Emergency Department if you experience worsening symptoms.  Please return if you develop a fever, you develop more swelling in your face or neck, you have trouble breathing or swallowing food.  Please return if you have any other emergent concerns.  Additional Information:  Your vital signs today were: BP (!) 139/102 (BP Location: Right Arm)    Pulse 100    Temp 98.6 F (37 C) (Oral)    Resp 16    Ht 5\' 5"  (1.651 m)    Wt 110.2 kg    SpO2 98%    BMI 40.44 kg/m  If your blood pressure (BP) was elevated above 135/85 this visit, please have this repeated by your doctor within one month. --------------

## 2017-04-19 ENCOUNTER — Encounter (HOSPITAL_COMMUNITY): Payer: Self-pay | Admitting: Emergency Medicine

## 2017-04-19 ENCOUNTER — Ambulatory Visit (HOSPITAL_COMMUNITY)
Admission: EM | Admit: 2017-04-19 | Discharge: 2017-04-19 | Disposition: A | Discharge status: Home / Self Care | Payer: Self-pay | Attending: Emergency Medicine | Admitting: Emergency Medicine

## 2017-04-19 DIAGNOSIS — I10 Essential (primary) hypertension: Secondary | ICD-10-CM

## 2017-04-19 MED ORDER — OLMESARTAN MEDOXOMIL-HCTZ 40-12.5 MG PO TABS: 1.0000 | ORAL_TABLET | Freq: Every day | ORAL | 2 refills | Status: DC

## 2017-04-19 NOTE — Discharge Instructions (Signed)
I have continued your Olmesartan-HCTZ as prescribed by your previous health care provider. I recommend dietary and lifestyle changes, reduce salt intake, get plenty of rest, eat fresh fruits and vegetables. I have provided the contact information for community health and wellness, contact them to schedule an appointment for establishment of primary care and management of your condition.

## 2017-04-19 NOTE — ED Provider Notes (Signed)
CSN: [id number]     Arrival date & time 04/19/17  1437 History   First MD Initiated Contact with Patient 04/19/17 1522     Chief Complaint  Patient presents with  . Hypertension   (Consider location/radiation/quality/duration/timing/severity/associated sxs/prior Treatment) 36 year old female presents to clinic for evaluation of her hypertension. She has been previously started on Olmesartan 40 mg HCTZ 12.5 by her prior primary care provider. States she lost her insurance, and has not been able to follow back up with this provider. 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 She has no acute signs or symptoms of any illness, and states she feels fine. She has no chest pain, shortness of breath, weakness, swelling in her hands, feet, ankles, heart palpitations, weakness or numbness unilaterally, nausea, vomiting, diarrhea, blurred vision, or headache.  Past family history is significant for hypertension with both mother, and father, as well as diabetes with both mother, and father. Social history is negative for smoking, alcohol, or recreational drug use.   The history is provided by the patient.    Past Medical History:  Diagnosis Date  . Asthma   . Hypertension    pt states her blood pressure has been up and down for the last year   Past Surgical History:  Procedure Laterality Date  . ANKLE FRACTURE SURGERY     Family History  Problem Relation Age of Onset  . Hypertension Mother   . Diabetes Mother   . Hypertension Brother   . Hypertension Maternal Grandmother   . Diabetes Father    Social History  Substance Use Topics  . Smoking status: Current Some Day Smoker  . Smokeless tobacco: Never Used  . Alcohol use No   OB History    No data available     Review of Systems  Constitutional: Negative.   HENT: Negative.   Respiratory: Negative.   Cardiovascular: Negative.   Gastrointestinal: Negative.   Genitourinary:  Negative.   Musculoskeletal: Negative.   Skin: Negative.   Neurological: Negative.     Allergies  Peanut-containing drug products  Home Medications   Prior to Admission medications   Medication Sig Start Date End Date Taking? Authorizing Provider  olmesartan-hydrochlorothiazide (BENICAR HCT) 40-12.5 MG tablet Take 1 tablet by mouth daily. 04/19/17   Barnet Glasgow, NP   Meds Ordered and Administered this Visit  Medications - No data to display  BP (!) 142/84 (BP Location: Right Wrist)   Pulse 60   Temp 98.2 F (36.8 C) (Oral)   Resp 20   SpO2 100%  No data found.   Physical Exam  Constitutional: She is oriented to person, place, and time. She appears well-developed and well-nourished. No distress.  HENT:  Head: Normocephalic and atraumatic.  Right Ear: External ear normal.  Left Ear: External ear normal.  Mouth/Throat: Oropharynx is clear and moist.  Eyes: Conjunctivae are normal. Right eye exhibits no discharge. Left eye exhibits no discharge.  Neck: Normal range of motion. No thyromegaly present.  Cardiovascular: Normal rate, regular rhythm and intact distal pulses.   Pulmonary/Chest: Effort normal and breath sounds normal.  Abdominal: Soft. Bowel sounds are normal.  Musculoskeletal: She exhibits no edema.  Lymphadenopathy:    She has no cervical adenopathy.  Neurological: She is alert and oriented to person, place, and time.  Skin: Skin is warm and dry. Capillary refill takes less than 2 seconds. She is not diaphoretic.  Psychiatric: She has a normal mood  and affect. Her behavior is normal.  Nursing note and vitals reviewed.   Urgent Care Course     Procedures (including critical care time)  Labs Review Labs Reviewed - No data to display  Imaging Review No results found.     MDM   1. Essential hypertension    Physical exam findings within normal limits, without any acute findings. Continuing her blood pressure medicine as prescribed by her prior  provider. Right counseling on diet, exercise, and lifestyle changes, also provided counseling on the importance of avoiding pregnancy while taking this medicine as it is pregnancy category X. patient verbalized understanding. Provided contact information for community health and wellness, strongly encouraged her to follow-up with them for long-term management of her condition.     Barnet Glasgow, NP 04/19/17 1551

## 2017-04-19 NOTE — ED Triage Notes (Signed)
The patient presented to the [facility] with a complaint of HTN.

## 2017-07-22 ENCOUNTER — Ambulatory Visit (HOSPITAL_COMMUNITY)
Admission: EM | Admit: 2017-07-22 | Discharge: 2017-07-22 | Disposition: A | Discharge status: Home / Self Care | Payer: Self-pay | Attending: Internal Medicine | Admitting: Internal Medicine

## 2017-07-22 ENCOUNTER — Encounter (HOSPITAL_COMMUNITY): Payer: Self-pay | Admitting: *Deleted

## 2017-07-22 DIAGNOSIS — M79675 Pain in left toe(s): Secondary | ICD-10-CM

## 2017-07-22 MED ORDER — DOXYCYCLINE HYCLATE 100 MG PO CAPS: 100.0000 mg | ORAL_CAPSULE | Freq: Two times a day (BID) | ORAL | 0 refills | Status: AC

## 2017-07-22 NOTE — ED Triage Notes (Signed)
Pt  Has   Redness   Pain  Swelling     Of  l  Big  Toe   X   1 1   Days

## 2017-07-22 NOTE — Discharge Instructions (Signed)
Use epsom salt soaks. Take ibuprofen 600-800 mg 3 times a day for pain. Monitor for worsening of symptoms, surrounding redness/increased warmth, fever, to follow-up for reevaluation.

## 2017-07-22 NOTE — ED Provider Notes (Signed)
CSN: [id number]     Arrival date & time 07/22/17  1805 History   None    Chief Complaint  Patient presents with  . Toe Pain   (Consider location/radiation/quality/duration/timing/severity/associated sxs/prior Treatment) 36 year old female comes in for 11 day history of left greater toe pain. She states there is swelling, redness of the lateral great toe nail. States she felt like toe nail was ingrown, and tried to pick at it, and was able to drain some purulent discharge out of it. Denies fever, chills, night sweats. Has not taken anything for the pain.      Past Medical History:  Diagnosis Date  . Asthma   . Hypertension    pt states her blood pressure has been up and down for the last year   Past Surgical History:  Procedure Laterality Date  . ANKLE FRACTURE SURGERY     Family History  Problem Relation Age of Onset  . Hypertension Mother   . Diabetes Mother   . Hypertension Brother   . Hypertension Maternal Grandmother   . Diabetes Father    Social History  Substance Use Topics  . Smoking status: Current Some Day Smoker  . Smokeless tobacco: Never Used  . Alcohol use No   OB History    No data available     Review of Systems  Constitutional: Negative for chills, diaphoresis, fatigue and fever.  Musculoskeletal: Negative for arthralgias, joint swelling and myalgias.    Allergies  Peanut-containing drug products  Home Medications   Prior to Admission medications   Medication Sig Start Date End Date Taking? Authorizing Provider  doxycycline (VIBRAMYCIN) 100 MG capsule Take 1 capsule (100 mg total) by mouth 2 (two) times daily. 07/22/17 07/29/17  Ok Edwards, PA-C  olmesartan-hydrochlorothiazide (BENICAR HCT) 40-12.5 MG tablet Take 1 tablet by mouth daily. 04/19/17   Barnet Glasgow, NP   Meds Ordered and Administered this Visit  Medications - No data to display  BP 132/72 (BP Location: Right Arm)   Pulse 78   Temp 98.6 F (37 C) (Oral)   Resp 18   LMP  06/28/2017   SpO2 100%  No data found.   Physical Exam  Constitutional: She is oriented to person, place, and time. She appears well-developed and well-nourished. No distress.  HENT:  Head: Normocephalic and atraumatic.  Eyes: Pupils are equal, round, and reactive to light. Conjunctivae are normal.  Musculoskeletal:  No swelling, surrounding erythema, increased warmth noted of the left greater toe. No tenderness on palpation of the metatarsals and phalanges. Tenderness on palpation of the lateral nailbed of the left greater toe. No palpable induration or fluctuance.  Neurological: She is alert and oriented to person, place, and time.  Skin: Skin is warm and dry.    Urgent Care Course     Procedures (including critical care time)  Labs Review Labs Reviewed - No data to display  Imaging Review No results found.      MDM   1. Pain of toe of left foot    Discussed with patient no palpable paronychia today. Pain could be due to irritation. Discussed treatment with Epsom salt and NSAIDs. Patient preferred antibiotic treatment, discussed risk and benefits of oral antibiotic use, patient would still like antibiotic treatment. Start doxycycline as directed. Side effects of medication discussed with patient. Take ibuprofen 600-800 mg 3 times a day for pain control. Patient to refrain from further irritation to the greater toe, including probing area, cutting too much of toe nail. Monitor  for worsening of symptoms, surrounding erythema, increased warmth, fever, follow-up with PCP were podiatry for further evaluation.   Ok Edwards, PA-C 07/22/17 1915    Ok Edwards, PA-C 07/22/17 [phone]

## 2017-08-25 IMAGING — US US TRANSVAGINAL NON-OB
1 series · 15 of 25 positions shown · non-contrast
Comparison: CT 01/01/2013.

CLINICAL DATA: Chronic pelvic pain.  Menorrhagia.

EXAM:
TRANSABDOMINAL AND TRANSVAGINAL ULTRASOUND OF PELVIS
TECHNIQUE: Both transabdominal and transvaginal ultrasound examinations of the
pelvis were performed. Transabdominal technique was performed for
global imaging of the pelvis including uterus, ovaries, adnexal
regions, and pelvic cul-de-sac. It was necessary to proceed with
endovaginal exam following the transabdominal exam to visualize the
uterus and ovaries..

[Series 1: us transvaginal non-ob · 15 of 87 slices shown]
[im 1/87]
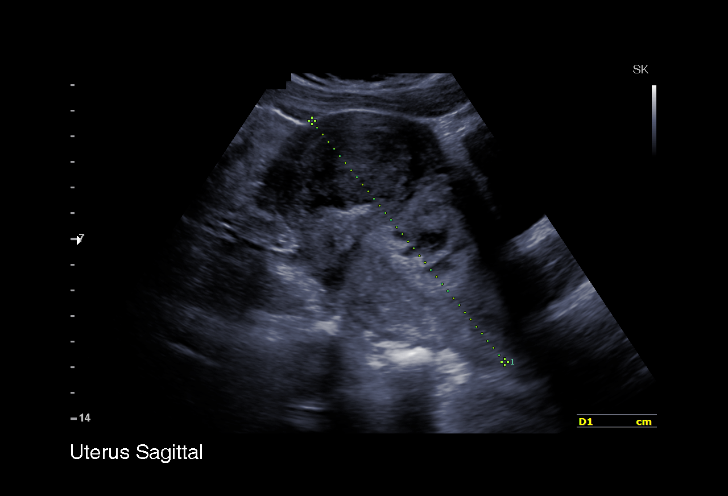
[im 8/87]
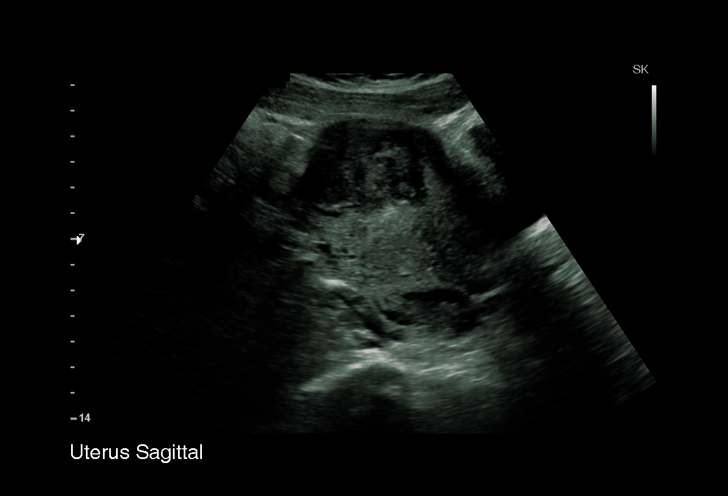
[im 15/87]
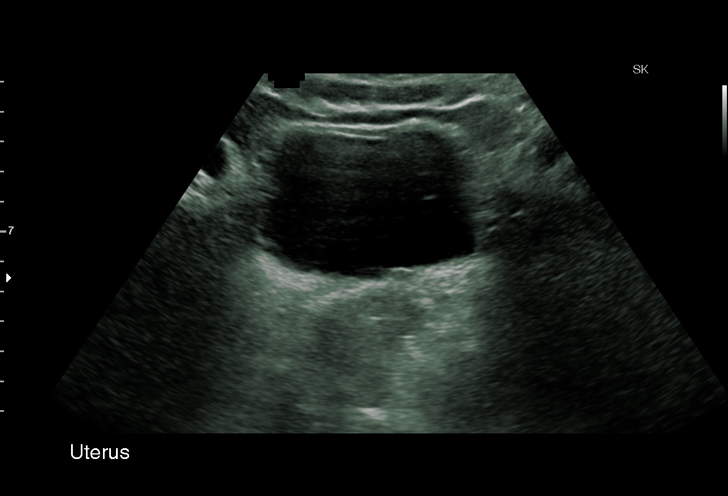
[im 18/87]
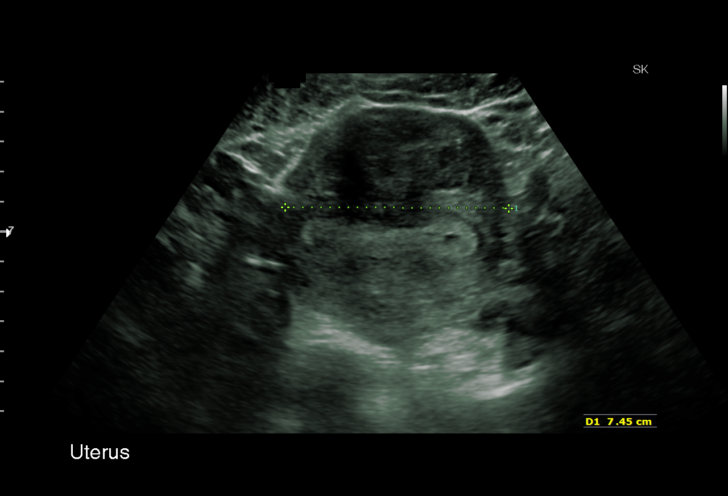
[im 26/87]
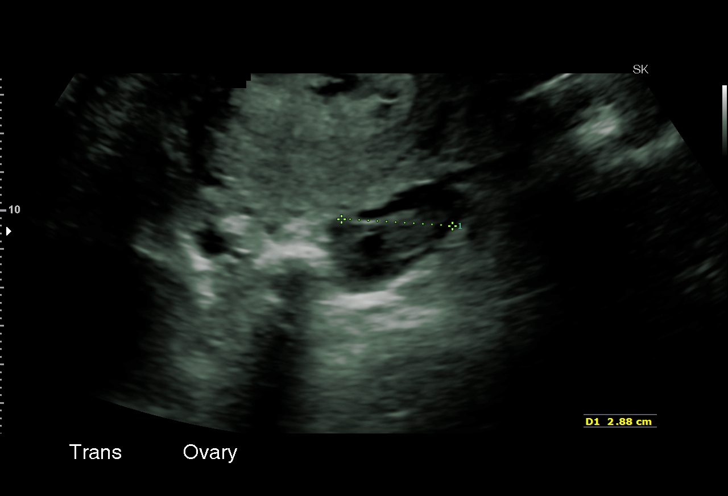
[im 33/87]
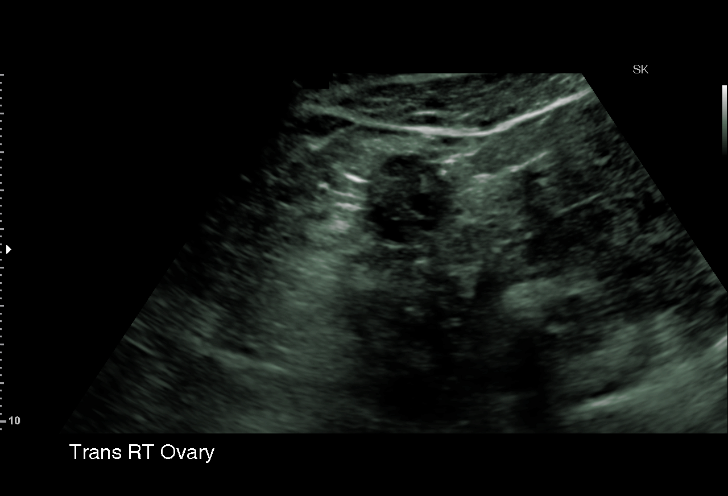
[im 36/87]
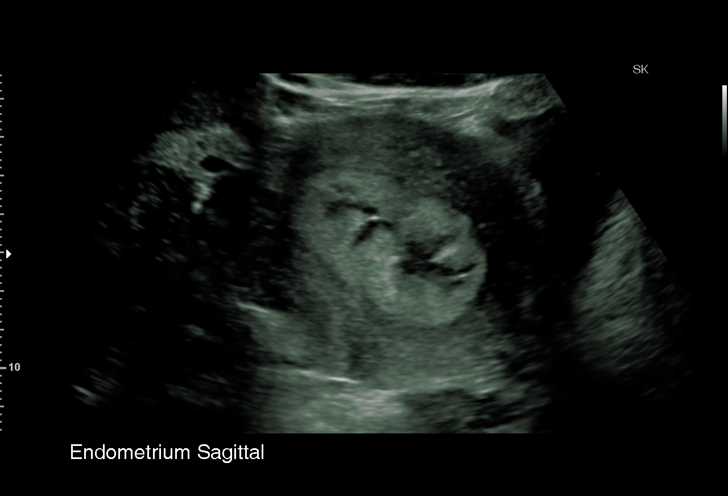
[im 44/87]
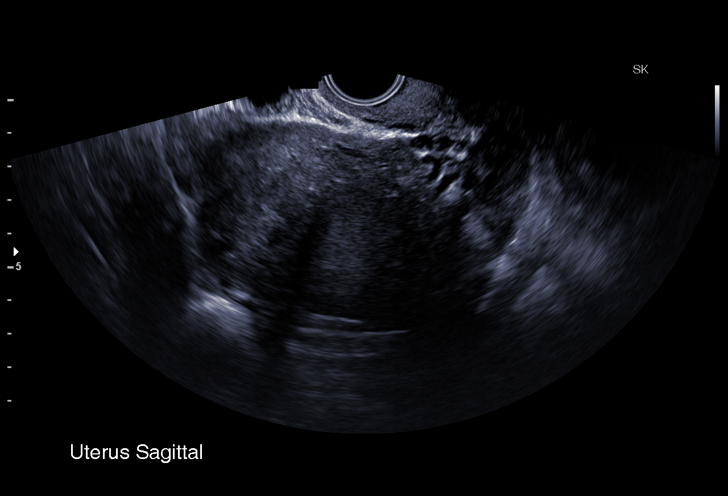
[im 51/87]
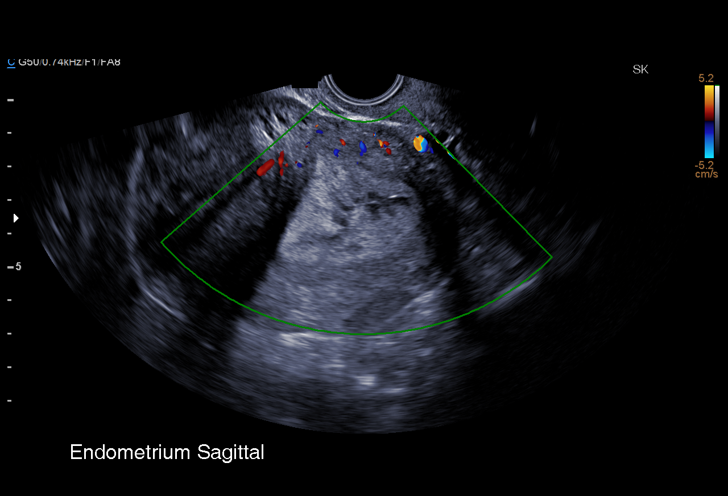
[im 54/87]
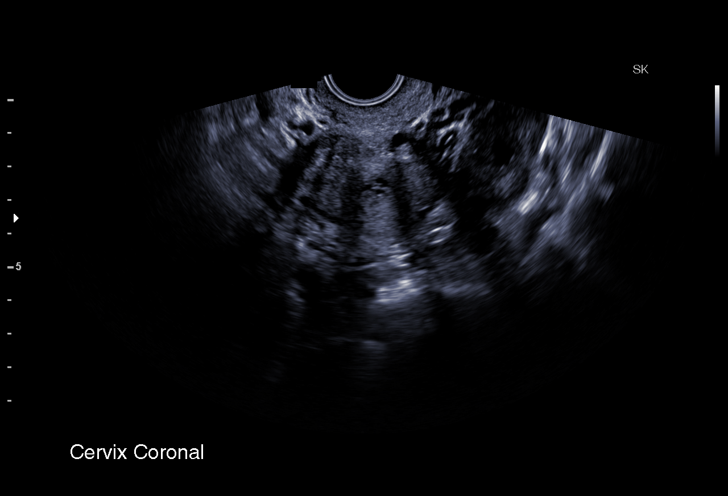
[im 61/87]
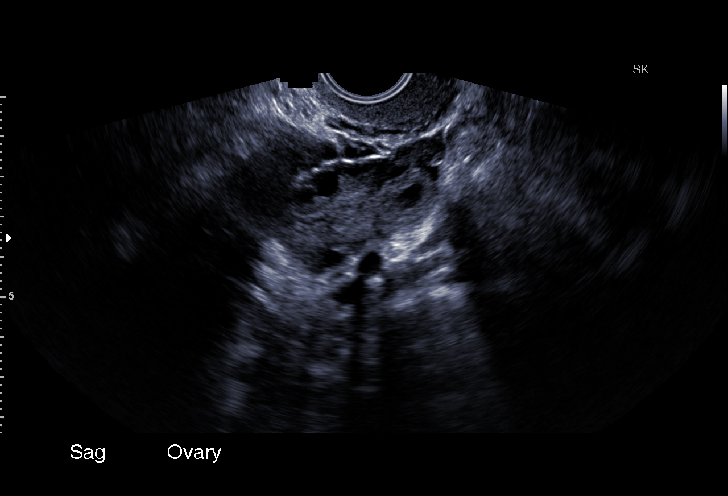
[im 69/87]
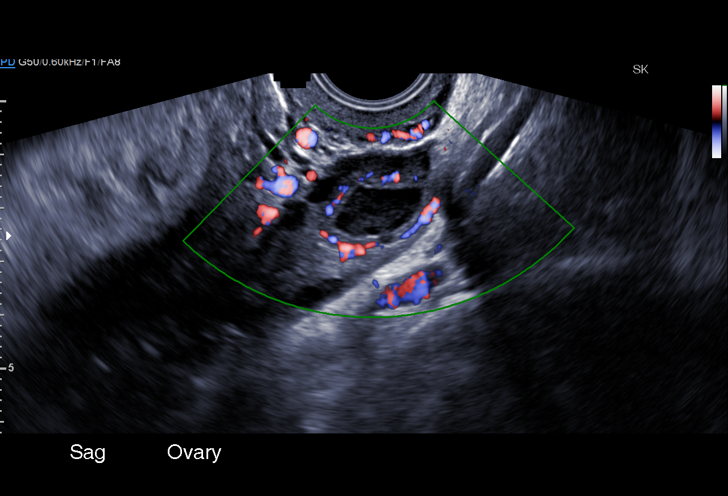
[im 72/87]
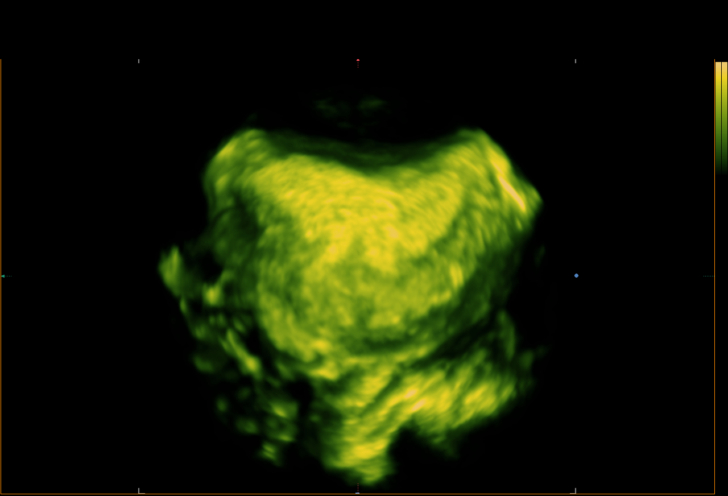
[im 79/87]
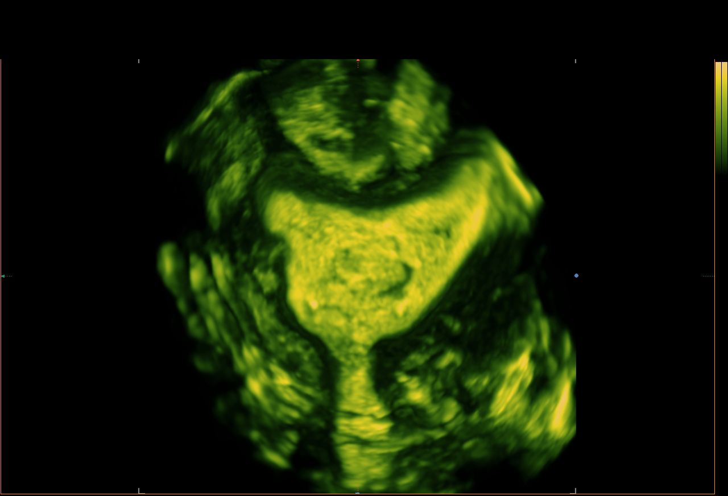
[im 87/87]
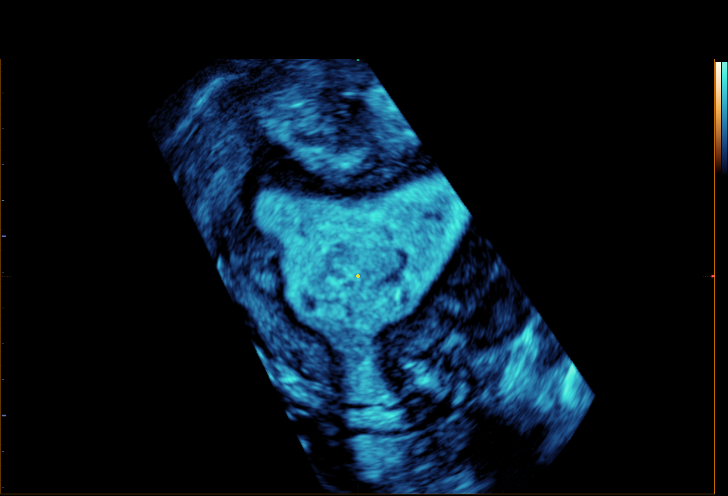

[15 of 25 positions shown; findings below may reference images not displayed]

FINDINGS: Uterus

Measurements: 11.6 x 6.8 x 8.1 cm. 6.1 x 4.4 x 5.6 cm fibroid noted
in the upper portion of the fundus of the uterus.

Endometrium

Thickness: 31.6 mm. The endometrium is markedly thickened and very
heterogeneous. Complex fluid collections in the endometrial canal..

Right ovary

Measurements: 4.5 x 1.9 x 2.6 cm. Normal appearance/no adnexal mass.

Left ovary

Measurements: 4.4 x 2.6 x 3.9 cm. 2.2 cm complex cysts which appears
collapse. This is most likely a collapsed corpus luteal cyst.

Other findings

No abnormal free fluid.
IMPRESSION: 1. Endometrium is severely thickened and very heterogeneous. Complex
endometrial canal fluid collection is present. Endometrial
malignancy cannot be excluded. If bleeding remains unresponsive to
hormonal or medical therapy, focal lesion work-up with
sonohysterogram should be considered. Endometrial biopsy should also
be considered in pre-menopausal patients at high risk for
endometrial carcinoma. (Ref: Radiological Reasoning: Algorithmic
Workup of Abnormal Vaginal Bleeding with Endovaginal Sonography and
Sonohysterography. [id number]; 191:S68-73)

2.  6.1 cm uterine fibroid.

## 2017-09-20 ENCOUNTER — Emergency Department (HOSPITAL_COMMUNITY)
Admission: EM | Admit: 2017-09-20 | Discharge: 2017-09-20 | Disposition: A | Discharge status: Home / Self Care | Payer: Self-pay | Attending: Emergency Medicine | Admitting: Emergency Medicine

## 2017-09-20 ENCOUNTER — Encounter (HOSPITAL_COMMUNITY): Payer: Self-pay

## 2017-09-20 DIAGNOSIS — Z720 Tobacco use: Secondary | ICD-10-CM | POA: Insufficient documentation

## 2017-09-20 DIAGNOSIS — J45909 Unspecified asthma, uncomplicated: Secondary | ICD-10-CM | POA: Insufficient documentation

## 2017-09-20 DIAGNOSIS — I10 Essential (primary) hypertension: Secondary | ICD-10-CM | POA: Insufficient documentation

## 2017-09-20 DIAGNOSIS — H5789 Other specified disorders of eye and adnexa: Secondary | ICD-10-CM | POA: Insufficient documentation

## 2017-09-20 MED ORDER — TETRACAINE HCL 0.5 % OP SOLN
1.0000 [drp] | Freq: Once | OPHTHALMIC | Status: AC
  Administered 2017-09-20: 1 [drp] via OPHTHALMIC

## 2017-09-20 MED ORDER — ERYTHROMYCIN 5 MG/GM OP OINT: TOPICAL_OINTMENT | OPHTHALMIC | 0 refills | Status: DC

## 2017-09-20 MED ORDER — FLUORESCEIN SODIUM 1 MG OP STRP
1.0000 | ORAL_STRIP | Freq: Once | OPHTHALMIC | Status: AC
  Administered 2017-09-20: 1 via OPHTHALMIC

## 2017-09-20 NOTE — ED Provider Notes (Signed)
[facility] DEPT Provider Note   CSN: [id number] Arrival date & time: 09/20/17  1204     History   Chief Complaint No chief complaint on file.   HPI Peggy Schmidt is a 36 y.o. female.  HPI 36 year old African-American female with np past medical history presents to the emergency Department today with complaints of left eyelid irritation. Patient states it started 1 day ago. Denies any trauma. Denies any associated vision changes.she does report some eye itching and clear drainage since yesterday. But it may be a stye. Reports some mild pain but denies any foreign body sensation. Denies any associated rhinorrhea, sore throat, fevers. She has not tried any further symptoms. Nothing makes better or worse. Patient does wear glasses but denies contact use. Not currently wearing glasses. Past Medical History:  Diagnosis Date  . Asthma   . Hypertension    pt states her blood pressure has been up and down for the last year    Patient Active Problem List   Diagnosis Date Noted  . Fibroid uterus 01/15/2016    Past Surgical History:  Procedure Laterality Date  . ANKLE FRACTURE SURGERY      OB History    No data available       Home Medications    Prior to Admission medications   Medication Sig Start Date End Date Taking? Authorizing Provider  erythromycin ophthalmic ointment Place a 1/2 inch ribbon of ointment into the lower eyelid 4 times a day for 5 days. 09/20/17   Doristine Devoid, PA-C  olmesartan-hydrochlorothiazide (BENICAR HCT) 40-12.5 MG tablet Take 1 tablet by mouth daily. 04/19/17   Barnet Glasgow, NP    Family History Family History  Problem Relation Age of Onset  . Hypertension Mother   . Diabetes Mother   . Hypertension Brother   . Hypertension Maternal Grandmother   . Diabetes Father     Social History Social History  Substance Use Topics  . Smoking status: Current Some Day Smoker  . Smokeless tobacco: Never Used  . Alcohol use No      Allergies   Peanut-containing drug products   Review of Systems Review of Systems  Constitutional: Negative for chills and fever.  HENT: Negative for sinus pain, sinus pressure, sneezing and sore throat.   Eyes: Positive for pain, discharge and itching. Negative for photophobia, redness and visual disturbance.  Skin: Negative for rash.  Neurological: Negative for headaches.     Physical Exam Updated Vital Signs BP (!) 141/84   Pulse 83   Temp 98.5 F (36.9 C) (Oral)   Resp 16   SpO2 100%   Physical Exam  Constitutional: She appears well-developed and well-nourished. No distress.  HENT:  Head: Normocephalic and atraumatic.  Eyes: Pupils are equal, round, and reactive to light. Conjunctivae, EOM and lids are normal. Lids are everted and swept, no foreign bodies found. Right eye exhibits no discharge. Left eye exhibits no discharge. Right conjunctiva is not injected. Right conjunctiva has no hemorrhage. Left conjunctiva is not injected. Left conjunctiva has no hemorrhage. No scleral icterus.  Slit lamp exam:      The left eye shows no corneal abrasion, no hyphema and no fluorescein uptake.  ild irritation in the left lower eyelid. No surrounding cellulitis. No tenderness to palpation. No proptosis  Neck: Normal range of motion.  Pulmonary/Chest: No respiratory distress.  Musculoskeletal: Normal range of motion.  Neurological: She is alert.  Skin: No pallor.  Psychiatric: Her behavior is normal. Judgment and thought  content normal.  Nursing note and vitals reviewed.    ED Treatments / Results  Labs (all labs ordered are listed, but only abnormal results are displayed) Labs Reviewed - No data to display  EKG  EKG Interpretation None       Radiology No results found.  Procedures Procedures (including critical care time)  Medications Ordered in ED Medications  fluorescein ophthalmic strip 1 strip (1 strip Left Eye Given 09/20/17 1401)  tetracaine  (PONTOCAINE) 0.5 % ophthalmic solution 1 drop (1 drop Left Eye Given 09/20/17 1401)     Initial Impression / Assessment and Plan / ED Course  I have reviewed the triage vital signs and the nursing notes.  Pertinent labs & imaging results that were available during my care of the patient were reviewed by me and considered in my medical decision making (see chart for details).     Patient presents to the ED with complaints of left lower eyelid irritation and clear drainage since yesterday. Denies any associated vision changes. No photophobia noted. No history of trauma. Patient is not a contact lens wearer. I exam reveals no corneal ulcer or abrasion. Vision is equal in both eyes. Do not see any signs of a stye however there is some left lower eyelid irritation. No signs of orbital or periorbital cellulitis. Encouraged warm compresses at home. Motrin or Tylenol for pain. Have given her erythromycin ointment. ) Follow up with PCP and ophthalmologist.  Pt is hemodynamically stable, in NAD, & able to ambulate in the ED. Evaluation does not show pathology that would require ongoing emergent intervention or inpatient treatment. I explained the diagnosis to the patient. Pain has been managed & has no complaints prior to dc. Pt is comfortable with above plan and is stable for discharge at this time. All questions were answered prior to disposition. Strict return precautions for f/u to the ED were discussed. Encouraged follow up with PCP.   Final Clinical Impressions(s) / ED Diagnoses   Final diagnoses:  Irritation of left eye    New Prescriptions New Prescriptions   ERYTHROMYCIN OPHTHALMIC OINTMENT    Place a 1/2 inch ribbon of ointment into the lower eyelid 4 times a day for 5 days.     Doristine Devoid, PA-C 09/20/17 1404    Drenda Freeze, MD 09/21/17 [phone]

## 2017-09-20 NOTE — ED Triage Notes (Signed)
Patient complains of left eye irritation and clear drainage x 1 week, denies trauma, NAd

## 2017-09-20 NOTE — Discharge Instructions (Signed)
This is likely a conjunctivitis of your eye. Warm and cold compresses. Use the antibody appointment as prescribed. Motrin or Tylenol for pain. Make she follow up with her primary care and I doctor if symptoms do not improve.

## 2017-09-20 NOTE — ED Notes (Signed)
Pt reports mild pain left eye x 2 days. No injury.

## 2018-02-15 ENCOUNTER — Emergency Department (HOSPITAL_COMMUNITY)
Admission: EM | Admit: 2018-02-15 | Discharge: 2018-02-15 | Disposition: A | Discharge status: Home / Self Care | Payer: BLUE CROSS/BLUE SHIELD | Attending: Emergency Medicine | Admitting: Emergency Medicine

## 2018-02-15 ENCOUNTER — Other Ambulatory Visit: Payer: Self-pay

## 2018-02-15 ENCOUNTER — Ambulatory Visit (INDEPENDENT_AMBULATORY_CARE_PROVIDER_SITE_OTHER): Payer: Self-pay | Admitting: Family Medicine

## 2018-02-15 ENCOUNTER — Encounter: Payer: Self-pay | Admitting: Family Medicine

## 2018-02-15 ENCOUNTER — Encounter (HOSPITAL_COMMUNITY): Payer: Self-pay

## 2018-02-15 VITALS — BP 130/78 | HR 89 | Temp 98.7°F | Ht 65.0 in | Wt 251.6 lb

## 2018-02-15 DIAGNOSIS — J45909 Unspecified asthma, uncomplicated: Secondary | ICD-10-CM | POA: Insufficient documentation

## 2018-02-15 DIAGNOSIS — J309 Allergic rhinitis, unspecified: Secondary | ICD-10-CM | POA: Diagnosis present

## 2018-02-15 DIAGNOSIS — E1159 Type 2 diabetes mellitus with other circulatory complications: Secondary | ICD-10-CM | POA: Insufficient documentation

## 2018-02-15 DIAGNOSIS — M25562 Pain in left knee: Secondary | ICD-10-CM

## 2018-02-15 DIAGNOSIS — I10 Essential (primary) hypertension: Secondary | ICD-10-CM

## 2018-02-15 DIAGNOSIS — Z79899 Other long term (current) drug therapy: Secondary | ICD-10-CM | POA: Insufficient documentation

## 2018-02-15 DIAGNOSIS — F17210 Nicotine dependence, cigarettes, uncomplicated: Secondary | ICD-10-CM | POA: Diagnosis not present

## 2018-02-15 DIAGNOSIS — G8929 Other chronic pain: Secondary | ICD-10-CM

## 2018-02-15 DIAGNOSIS — R519 Headache, unspecified: Secondary | ICD-10-CM

## 2018-02-15 DIAGNOSIS — D509 Iron deficiency anemia, unspecified: Secondary | ICD-10-CM | POA: Insufficient documentation

## 2018-02-15 DIAGNOSIS — M25561 Pain in right knee: Secondary | ICD-10-CM

## 2018-02-15 DIAGNOSIS — R51 Headache: Secondary | ICD-10-CM

## 2018-02-15 DIAGNOSIS — I152 Hypertension secondary to endocrine disorders: Secondary | ICD-10-CM | POA: Insufficient documentation

## 2018-02-15 DIAGNOSIS — Z87828 Personal history of other (healed) physical injury and trauma: Secondary | ICD-10-CM | POA: Insufficient documentation

## 2018-02-15 DIAGNOSIS — J3089 Other allergic rhinitis: Secondary | ICD-10-CM

## 2018-02-15 MED ORDER — MELOXICAM 15 MG PO TABS: 15.0000 mg | ORAL_TABLET | Freq: Every day | ORAL | 0 refills | Status: DC

## 2018-02-15 MED ORDER — OLMESARTAN MEDOXOMIL-HCTZ 40-12.5 MG PO TABS: 1.0000 | ORAL_TABLET | Freq: Every day | ORAL | 2 refills | Status: DC

## 2018-02-15 MED ORDER — OLMESARTAN MEDOXOMIL-HCTZ 40-12.5 MG PO TABS: 1.0000 | ORAL_TABLET | Freq: Once | ORAL | Status: DC

## 2018-02-15 MED ORDER — HYDROCHLOROTHIAZIDE 12.5 MG PO CAPS
12.5000 mg | ORAL_CAPSULE | Freq: Every day | ORAL | Status: DC
  Administered 2018-02-15: 12.5 mg via ORAL
  Filled 2018-02-15: qty 1

## 2018-02-15 MED ORDER — FLUTICASONE PROPIONATE 50 MCG/ACT NA SUSP: 1.0000 | Freq: Every day | NASAL | 2 refills | Status: DC

## 2018-02-15 MED ORDER — CETIRIZINE HCL 10 MG PO TABS: 10.0000 mg | ORAL_TABLET | Freq: Every day | ORAL | 0 refills | Status: DC

## 2018-02-15 MED ORDER — IRBESARTAN 300 MG PO TABS
300.0000 mg | ORAL_TABLET | Freq: Every day | ORAL | Status: DC
  Administered 2018-02-15: 300 mg via ORAL
  Filled 2018-02-15: qty 1

## 2018-02-15 NOTE — Assessment & Plan Note (Signed)
Patient has been managed at neurology in the past for history of head injury and chronic daily headaches.  She has not seen them for over a year.  No red flag signs or symptoms today.  Requests referral to neurologist here in [place].  This order was placed today.

## 2018-02-15 NOTE — Assessment & Plan Note (Signed)
At goal today however patient was given dose of olmesartan and HCTZ earlier today.  I will refill her blood pressure medication.  She will follow-up with me in a few weeks for recheck.  We will check BMET today.

## 2018-02-15 NOTE — Assessment & Plan Note (Signed)
Check CBC and iron panel.  Recommended ferrous sulfate 325 mg every other day.

## 2018-02-15 NOTE — ED Notes (Signed)
Pt reports that she has been sneezing and having nasal drainage for "a while now". Reports using loratadine and benadryl off and on.

## 2018-02-15 NOTE — ED Triage Notes (Signed)
Pt states that she has had sneezing, itching/watery eyes, nasal congestion for "some years." Pt states her symptoms are worse after being at work. She reports taking benadryl and claritin with no improvement. Np distress noted.
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
## 2018-02-15 NOTE — Assessment & Plan Note (Signed)
Multifactorial.  Patient has likely some component of patellofemoral syndrome.  May also have small amount of underlying degenerative changes and and possible mild meniscal tear.  Discussed treatment options with patient.  We will proceed with conservative therapy.  Start Mobic 15 mg daily over the next 2 weeks.  Discussed home exercise program focusing on strengthening quad musculature.  If not improving with above, would consider formal referral to physical therapy.  May ultimately need sports medicine referral and/or intra-articular steroid injection.

## 2018-02-15 NOTE — ED Notes (Signed)
ED Provider at bedside. 

## 2018-02-15 NOTE — ED Provider Notes (Signed)
[facility] EMERGENCY DEPARTMENT Provider Note   CSN: [id number] Arrival date & time: 02/15/18  8315     History   Chief Complaint Chief Complaint  Patient presents with  . Nasal Congestion    HPI Peggy Schmidt is a 37 y.o. female with a past medical history of asthma, hypertension, who presents to ED for evaluation of several years long history of allergic rhinitis including rhinorrhea, itchy and watery eyes that has worsened over the past few weeks.  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  She has tried intermittent doses of Benadryl and Claritin with no improvement in her symptoms.  She states that she experiences these allergy symptoms year round regardless of the season.  She has never followed up with an allergy specialist. She states that she has been out of her blood pressure medications for several months.  She is scheduled to meet with a PCP at the [facility] this afternoon.  She denies any headache, vision changes, chest pain or shortness of breath.  HPI  Past Medical History:  Diagnosis Date  . Asthma   . Hypertension    pt states her blood pressure has been up and down for the last year    Patient Active Problem List   Diagnosis Date Noted  . Fibroid uterus 01/15/2016    Past Surgical History:  Procedure Laterality Date  . ANKLE FRACTURE SURGERY      OB History    No data available       Home Medications    Prior to Admission medications   Medication Sig Start Date End Date Taking? Authorizing Provider  cetirizine (ZYRTEC) 10 MG tablet Take 1 tablet (10 mg total) by mouth daily. 02/15/18   Khatri, Hina, PA-C  erythromycin ophthalmic ointment Place a 1/2 inch ribbon of ointment into the lower eyelid 4 times a day for 5 days. 09/20/17   Doristine Devoid, PA-C  fluticasone (FLONASE) 50 MCG/ACT nasal spray Place 1 spray into both  nostrils daily. 02/15/18   Khatri, Hina, PA-C  olmesartan-hydrochlorothiazide (BENICAR HCT) 40-12.5 MG tablet Take 1 tablet by mouth daily. 04/19/17   Barnet Glasgow, NP    Family History Family History  Problem Relation Age of Onset  . Hypertension Mother   . Diabetes Mother   . Hypertension Brother   . Hypertension Maternal Grandmother   . Diabetes Father     Social History Social History   Tobacco Use  . Smoking status: Current Some Day Smoker  . Smokeless tobacco: Never Used  Substance Use Topics  . Alcohol use: No    Alcohol/week: 0.0 oz  . Drug use: No     Allergies   Peanut-containing drug products and Other   Review of Systems Review of Systems  Constitutional: Negative for chills and fever.  HENT: Positive for congestion and rhinorrhea. Negative for facial swelling, sinus pressure, sinus pain, sore throat and trouble swallowing.   Eyes: Positive for discharge and itching. Negative for photophobia, pain, redness and visual disturbance.  Respiratory: Negative for cough and shortness of breath.   Cardiovascular: Negative for chest pain.  Neurological: Negative for headaches.     Physical Exam Updated Vital Signs BP (!) 153/107 (BP Location: Right Arm)   Pulse (!) 104   Temp 98.4 F (36.9 C) (Oral)   Resp 20   SpO2 100%   Physical Exam  Constitutional: She appears well-developed and well-nourished. No distress.  Nontoxic appearing and in no acute distress.  HENT:  Head: Normocephalic and atraumatic.  Right Ear: Tympanic membrane normal.  Left Ear: Tympanic membrane normal.  Nose: Nose normal.  Mouth/Throat: Uvula is midline and oropharynx is clear and moist. No posterior oropharyngeal edema. No tonsillar exudate.  Allergic salute crease noted.  No active rhinorrhea at this time.  Eyes: Conjunctivae and EOM are normal. No scleral icterus.  Neck: Normal range of motion.  Cardiovascular: Normal rate, regular rhythm and normal heart sounds.    Pulmonary/Chest: Effort normal. No respiratory distress.  Neurological: She is alert.  Skin: No rash noted. She is not diaphoretic.  Psychiatric: She has a normal mood and affect.  Nursing note and vitals reviewed.    ED Treatments / Results  Labs (all labs ordered are listed, but only abnormal results are displayed) Labs Reviewed - No data to display  EKG  EKG Interpretation None       Radiology No results found.  Procedures Procedures (including critical care time)  Medications Ordered in ED Medications  olmesartan-hydrochlorothiazide (BENICAR HCT) 40-12.5 MG per tablet 1 tablet (not administered)     Initial Impression / Assessment and Plan / ED Course  I have reviewed the triage vital signs and the nursing notes.  Pertinent labs & imaging results that were available during my care of the patient were reviewed by me and considered in my medical decision making (see chart for details).     Patient presents to ED for evaluation of allergic rhinitis symptoms have been ongoing for several years but have worsened in the past several months due to her job.  She has tried intermittent doses of Claritin and Benadryl with no relief in her symptoms.  Has not followed up with a specialist in the past.  She is also stating that she has been out of her hypertension medications for the past several months.  Is hypertensive here to 153/107.  She denies chest pain, shortness of breath, vision changes, headache.  There is an allergic salute crease noted on physical examination with no active rhinorrhea or eye symptoms at this time.  Will give her 1 dose of her home blood pressure medication here.  She is scheduled to meet with her PCP later this afternoon and states that she will follow-up with them regarding refill of her blood pressure medication.  I did encourage her to consistently use an antihistamine daily based on the fact that her symptoms are present year-round and the nature of  her symptoms.  I suspect that her symptoms are due to environmental factors but she appears overall well.  Will give her a prescription for Zyrtec, Flonase to be taken.  Patient also requests a work note which will be provided.  She denies any other symptoms at this time.  Patient appears stable for discharge at this time.  Strict return precautions given.  Portions of this note were generated with Lobbyist. Dictation errors may occur despite best attempts at proofreading.   Final Clinical Impressions(s) / ED Diagnoses   Final diagnoses:  Non-seasonal allergic rhinitis due to other allergic trigger    ED Discharge Orders        Ordered    cetirizine (ZYRTEC) 10 MG tablet  Daily     02/15/18 0940    fluticasone (FLONASE) 50 MCG/ACT nasal spray  Daily     02/15/18 0940       Delia Heady, PA-C 02/15/18  4158    Varney Biles, MD 02/17/18 2204

## 2018-02-15 NOTE — Discharge Instructions (Signed)
Please read attached information regarding your condition. Take Zyrtec once daily every night to help with your allergy symptoms. Use Flonase as needed for nasal congestion and runny nose. Follow-up with your PCP and the allergy office listed below for further evaluation. Follow-up with your PCP regarding your blood pressure medications. Return to ED for worsening symptoms, trouble breathing or trouble swallowing, chest pain, headache or vision changes.

## 2018-02-15 NOTE — Patient Instructions (Addendum)
Please restart your BP medication.  Please take the mobic once daily for the next 2 weeks.   We will check blood work day.   Please do the exercises.  Come back to see me in a few weeks for a blood pressure recheck.  Take care, Dr [name]

## 2018-02-16 LAB — BASIC METABOLIC PANEL
BUN: 9 mg/dL (ref 6–23)
CO2: 26 mEq/L (ref 19–32)
Calcium: 9.5 mg/dL (ref 8.4–10.5)
Chloride: 103 mEq/L (ref 96–112)
Creatinine, Ser: 0.83 mg/dL (ref 0.40–1.20)
GFR: 100.74 mL/min (ref >60.00)
Glucose, Bld: 94 mg/dL (ref 70–99)
Potassium: 3.6 mEq/L (ref 3.5–5.1)
Sodium: 140 mEq/L (ref 135–145)

## 2018-02-16 LAB — CBC
HCT: 33.5 % — ABNORMAL LOW (ref 36.0–46.0)
Hemoglobin: 10.3 g/dL — ABNORMAL LOW (ref 12.0–15.0)
MCHC: 30.9 g/dL (ref 30.0–36.0)
MCV: 67 fl — ABNORMAL LOW (ref 78.0–100.0)
Platelets: 467 10*3/uL — ABNORMAL HIGH (ref 150.0–400.0)
RBC: 5 Mil/uL (ref 3.87–5.11)
RDW: 19.9 % — ABNORMAL HIGH (ref 11.5–15.5)
WBC: 8.2 10*3/uL (ref 4.0–10.5)

## 2018-02-16 LAB — IRON,TIBC AND FERRITIN PANEL
%SAT: 8 % (calc) — ABNORMAL LOW (ref 11–50)
Ferritin: 4 ng/mL — ABNORMAL LOW (ref 10–154)
Iron: 33 ug/dL — ABNORMAL LOW (ref 40–190)
TIBC: 409 mcg/dL (calc) (ref 250–450)

## 2018-02-16 NOTE — Progress Notes (Signed)
Dr Marigene Ehlers interpretation of your lab work:  Your electrolytes, blood sugar, and kidney function are all normal.  Your iron levels are low - Please try to take an iron supplement every other day. We can recheck in 3-6 months.    The rest of your blood counts are stable.   If you have any additional questions, please give [place] a call or send [place] a message through [place].  Take care, Dr [name]

## 2018-03-05 ENCOUNTER — Ambulatory Visit: Payer: Self-pay | Admitting: Family Medicine

## 2018-03-05 DIAGNOSIS — Z0289 Encounter for other administrative examinations: Secondary | ICD-10-CM

## 2018-03-09 ENCOUNTER — Ambulatory Visit (INDEPENDENT_AMBULATORY_CARE_PROVIDER_SITE_OTHER): Payer: BLUE CROSS/BLUE SHIELD | Admitting: Family Medicine

## 2018-03-09 ENCOUNTER — Encounter: Payer: Self-pay | Admitting: Family Medicine

## 2018-03-09 VITALS — BP 130/74 | HR 103 | Temp 98.6°F | Ht 65.0 in | Wt 253.2 lb

## 2018-03-09 DIAGNOSIS — L989 Disorder of the skin and subcutaneous tissue, unspecified: Secondary | ICD-10-CM

## 2018-03-09 DIAGNOSIS — I10 Essential (primary) hypertension: Secondary | ICD-10-CM

## 2018-03-09 DIAGNOSIS — D509 Iron deficiency anemia, unspecified: Secondary | ICD-10-CM

## 2018-03-09 NOTE — Assessment & Plan Note (Signed)
Blood pressure at goal.  Continue  olmesartan-HCTZ.  Follow-up in 6 months.

## 2018-03-09 NOTE — Patient Instructions (Signed)
Please take ferrous sulfate 325mg  every other day. Take this with vitamin C.  I will make a referral to the dermatologist.  We will not make any other changes today.  Come back to see me in 6 months to recheck your blood pressure and iron levels.  Take care, Dr [name]

## 2018-03-09 NOTE — Assessment & Plan Note (Signed)
Hemoglobin up 2 points compared to previous last year.  Recommended ferrous sulfate 325 mg every other day.  Recheck CBC and iron panel in 6 months.

## 2018-03-09 NOTE — Progress Notes (Signed)
    Subjective:  Peggy Schmidt is a 37 y.o. female who presents today with a chief complaint of hypertension follow up.   HPI:   Hypertension, established problem, stable Patient seen approximately 2 weeks ago for this.  At that time we started her back on her on olmesartan - hydrochlorothiazide.  She has been compliant with his medication and has done well.  No chest pain or shortness of breath.  Skin lesion, chronic problem, new problem to this provider Several year history.  Patient is concerned about a mole located on the anterior aspect of her nose.  It has not changed in size however she wants to have it removed and requests dermatology referral.  Iron deficiency anemia, chronic problem, improving Patient found to have iron deficiency anemia based on her last blood work.  She is not currently taking any sort of iron supplement.  She has had low iron in the past likely related to fibroid uterus.  No chest pain or shortness of breath.  No signs of active bleeding.  ROS: Per HPI  PMH: She reports that she has never smoked. She has never used smokeless tobacco. She reports that she does not drink alcohol or use drugs.  Objective:  Physical Exam: BP 130/74 (BP Location: Left Arm, Patient Position: Sitting, Cuff Size: Large)   Pulse (!) 103   Temp 98.6 F (37 C) (Oral)   Ht 5\' 5"  (1.651 m)   Wt 253 lb 3.2 oz (114.9 kg)   SpO2 97%   BMI 42.13 kg/m   Gen: NAD, resting comfortably CV: RRR with no murmurs appreciated Pulm: NWOB, CTAB with no crackles, wheezes, or rhonchi Skin: 3 mm hyperpigmented nevus on left anterior aspect of nose.  Assessment/Plan:  Essential hypertension Blood pressure at goal.  Continue  olmesartan-HCTZ.  Follow-up in 6 months.  Microcytic anemia Hemoglobin up 2 points compared to previous last year.  Recommended ferrous sulfate 325 mg every other day.  Recheck CBC and iron panel in 6 months.  Skin lesion Referral to dermatology placed.  Algis Greenhouse.  [name], MD 03/09/2018 4:03 PM

## 2018-04-24 ENCOUNTER — Other Ambulatory Visit: Payer: Self-pay | Admitting: Obstetrics and Gynecology

## 2018-04-27 ENCOUNTER — Emergency Department (HOSPITAL_COMMUNITY)
Admission: EM | Admit: 2018-04-27 | Discharge: 2018-04-27 | Discharge status: Against Medical Advice | Payer: BLUE CROSS/BLUE SHIELD

## 2018-04-27 NOTE — ED Notes (Signed)
Pt called for triage, no response from lobby 

## 2018-05-16 ENCOUNTER — Ambulatory Visit (INDEPENDENT_AMBULATORY_CARE_PROVIDER_SITE_OTHER): Payer: BLUE CROSS/BLUE SHIELD | Admitting: Podiatry

## 2018-05-16 ENCOUNTER — Encounter: Payer: Self-pay | Admitting: Podiatry

## 2018-05-16 VITALS — BP 152/89 | HR 89 | Ht 65.0 in | Wt 240.0 lb

## 2018-05-16 DIAGNOSIS — L03031 Cellulitis of right toe: Secondary | ICD-10-CM | POA: Diagnosis not present

## 2018-05-16 DIAGNOSIS — L600 Ingrowing nail: Secondary | ICD-10-CM | POA: Diagnosis not present

## 2018-05-16 DIAGNOSIS — S99922A Unspecified injury of left foot, initial encounter: Secondary | ICD-10-CM

## 2018-05-16 DIAGNOSIS — M79672 Pain in left foot: Secondary | ICD-10-CM

## 2018-05-16 DIAGNOSIS — M79671 Pain in right foot: Secondary | ICD-10-CM

## 2018-05-16 NOTE — Progress Notes (Signed)
SUBJECTIVE: 37 y.o. year old female presents stating that she had an infection on right great toe lateral border 2 and a half weeks ago. She was seen at [facility] but the problem has not resolved. The toe continued to drain.  Patient is too scared to have injection for permanent nail procedure. Stated that this is an initial incident. Patient drives bus all day.  History of left ankle surgery in 2003 after an injury in 1999.  Review of Systems  Constitutional: Negative.   HENT: Negative.   Eyes: Negative.   Respiratory: Negative.   Cardiovascular: Negative.   Gastrointestinal: Negative.   Genitourinary: Negative.   Musculoskeletal: Negative.   Skin: Negative.      OBJECTIVE: DERMATOLOGIC EXAMINATION: Nails: infected ingrown nail right lateral border.   VASCULAR EXAMINATION OF LOWER LIMBS: All pedal pulses are palpable with normal pulsation.  Capillary Filling times within 3 seconds in all digits.  Inflamed ungual labia with drainage lateral border right great toe. Temperature gradient from tibial crest to dorsum of foot is within normal bilateral.  NEUROLOGIC EXAMINATION OF THE LOWER LIMBS: All epicritic and tactile sensations grossly intact. Sharp and Dull discriminatory sensations at the plantar ball of hallux is intact bilateral.   MUSCULOSKELETAL EXAMINATION: Overlapping 1st and 2nd toe right, the great toe under rides to 2nd toe at lateral border.  RADIOGRAPHIC STUDIES:  Left foot x-ray show no abnormal findings in forefoot or rearfoot.  ASSESSMENT: Ingrown nail right great toe lateral border with drainage. Pain in post surgical left foot (2003) following injury with no abnormal findings.  PLAN: Reviewed findings and available treatment options. Removed ingrown nail from lateral border, debrided keratotic tissue build up at ungual labia, betadine dressing applied with home care instruction to soak in Epson salt water daily till pain stop. May return as  needed.

## 2018-05-16 NOTE — Patient Instructions (Signed)
Seen for ingrown right great toe nail and pain in left foot. Left foot x-ray show normal findings. Right great toe ingrown nail removed, debrided build up callus from Ungual labia, cleansed with Iodine and compression bandage applied. May start soaking tomorrow daily till pain and drainage stops. Return for permanent nail procedure if problem returns.

## 2018-05-31 ENCOUNTER — Telehealth: Payer: Self-pay | Admitting: Family Medicine

## 2018-05-31 NOTE — Telephone Encounter (Signed)
See note

## 2018-05-31 NOTE — Telephone Encounter (Signed)
Would like to have her come in for a quick office visit to recheck her BP before witting letter.  Algis Greenhouse. [name], MD 05/31/2018 3:24 PM

## 2018-05-31 NOTE — Telephone Encounter (Signed)
Copied from [place] [id number]. Topic: Inquiry >> May 31, 2018  8:53 AM Peggy Schmidt, NT wrote: Reason for CRM: Patient called and needs a clearance letter sent to [employer] in regards to her being prescribed medication for high blood pressure  or not  faxed to  [phone]  to the of attention  Elise Benne Please call the patient to discuss at [phone] prior to faxing ,

## 2018-05-31 NOTE — Telephone Encounter (Signed)
Spoke with patient.  She states she has been checking her blood pressure and has not been taking her blood pressure medication regularly.  She is "weaning" herself off the medication because her blood pressure has been under good control and she doesn't feel that she needs the medication any longer.  States she feels "funny" when she takes the medication now because her BP has been lower.  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    Ok to write letter?

## 2018-05-31 NOTE — Telephone Encounter (Signed)
Are you able to call patient to schedule her for an appointment for office visit?  Thanks!

## 2018-06-01 ENCOUNTER — Encounter: Payer: Self-pay | Admitting: Family Medicine

## 2018-06-01 ENCOUNTER — Ambulatory Visit (INDEPENDENT_AMBULATORY_CARE_PROVIDER_SITE_OTHER): Payer: Self-pay | Admitting: Family Medicine

## 2018-06-01 DIAGNOSIS — I10 Essential (primary) hypertension: Secondary | ICD-10-CM

## 2018-06-01 MED ORDER — OLMESARTAN MEDOXOMIL-HCTZ 20-12.5 MG PO TABS: 1.0000 | ORAL_TABLET | Freq: Every day | ORAL | 2 refills | Status: DC

## 2018-06-01 NOTE — Telephone Encounter (Signed)
See note

## 2018-06-01 NOTE — Patient Instructions (Signed)
It was very nice to see you today!  We will decrease the dose of your blood pressure med by half.  You can take half of a pill daily of the remaining supply.  I will send in a new prescription that is half dose.  You should take 1 of these pills daily.  Please avoid salt and stay as active as you can.  Please keep a close eye on your blood pressure and let me know if your pressure is 140/90 or higher.  Come back to see me in 3-6 months, or sooner as needed.  Take care, Dr [name]

## 2018-06-01 NOTE — Assessment & Plan Note (Signed)
At goal today.  Patient wishes to try to decrease her blood pressure meds.  We will switch Benicar dose to 20-12.5 mg tablet once daily.  Also encouraged lifestyle modifications including regular exercise and low-salt diet.  Discussed home blood pressure monitoring with goal 140/90 or lower.  Letter was given to patient today stating that she was currently being treated for hypertension for her employer.  Discussed reasons to return to care.  Follow-up in 3 to 6 months.

## 2018-06-01 NOTE — Telephone Encounter (Signed)
Patient has been scheduled for appointment today at 11:00 am.

## 2018-06-01 NOTE — Progress Notes (Signed)
   Subjective:  Peggy Schmidt is a 37 y.o. female who presents today with a chief complaint of hypertension.   HPI:  Hypertension, established problem, Stable BP Readings from Last 3 Encounters:  06/01/18 138/84  05/16/18 (!) 152/89  03/09/18 130/74  Patient currently on olmesartan-HCTZ 40-12.5 mg daily.  She tolerates this well without side effects.  She is trying to wean herself off of this as she does not like taking a daily medication.  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  ROS: Per HPI  Objective:  Physical Exam: BP 138/84 (BP Location: Left Arm, Patient Position: Sitting, Cuff Size: Large)   Pulse 90   Temp 98.6 F (37 C) (Oral)   Ht 5\' 5"  (1.651 m)   Wt 252 lb 12.8 oz (114.7 kg)   SpO2 99%   BMI 42.07 kg/m   Wt Readings from Last 3 Encounters:  06/01/18 252 lb 12.8 oz (114.7 kg)  05/16/18 240 lb (108.9 kg)  03/09/18 253 lb 3.2 oz (114.9 kg)  Gen: NAD, resting comfortably CV: RRR with no murmurs appreciated Pulm: NWOB, CTAB with no crackles, wheezes, or rhonchi MSK: Trace pretibial edema  Assessment/Plan:  Essential hypertension At goal today.  Patient wishes to try to decrease her blood pressure meds.  We will switch Benicar dose to 20-12.5 mg tablet once daily.  Also encouraged lifestyle modifications including regular exercise and low-salt diet.  Discussed home blood pressure monitoring with goal 140/90 or lower.  Letter was given to patient today stating that she was currently being treated for hypertension for her employer.  Discussed reasons to return to care.  Follow-up in 3 to 6 months.  Time Spent: I spent 15 minutes face-to-face with the patient, with more than half spent on counseling for management plan for her hypertension.   Algis Greenhouse. [name], MD 06/01/2018 11:42 AM

## 2018-06-01 NOTE — Telephone Encounter (Signed)
Requesting to speak with Amber, would not elaborate

## 2018-06-11 ENCOUNTER — Ambulatory Visit (INDEPENDENT_AMBULATORY_CARE_PROVIDER_SITE_OTHER): Payer: Self-pay | Admitting: Family Medicine

## 2018-06-11 ENCOUNTER — Encounter: Payer: Self-pay | Admitting: Family Medicine

## 2018-06-11 VITALS — BP 130/78 | HR 90 | Temp 98.2°F | Ht 65.0 in | Wt 245.8 lb

## 2018-06-11 DIAGNOSIS — N939 Abnormal uterine and vaginal bleeding, unspecified: Secondary | ICD-10-CM

## 2018-06-11 DIAGNOSIS — N938 Other specified abnormal uterine and vaginal bleeding: Secondary | ICD-10-CM

## 2018-06-11 LAB — POCT URINE PREGNANCY: Preg Test, Ur: NEGATIVE

## 2018-06-11 LAB — POCT HEMOGLOBIN: Hemoglobin: 9 g/dL — AB (ref 12.2–16.2)

## 2018-06-11 MED ORDER — MEDROXYPROGESTERONE ACETATE 150 MG/ML IM SUSP
150.0000 mg | Freq: Once | INTRAMUSCULAR | Status: AC
Start: 2018-06-11 — End: 2018-06-11
  Administered 2018-06-11: 150 mg via INTRAMUSCULAR

## 2018-06-11 MED ORDER — MEDROXYPROGESTERONE ACETATE 150 MG/ML IM SUSP: 150.0000 mg | Freq: Once | INTRAMUSCULAR | Status: DC

## 2018-06-11 NOTE — Patient Instructions (Signed)
It was very nice to see you today!  Your pregnancy test is negative.  Your iron levels are low, but stable.  Please continue taking iron pill.  We will give you an injection of the medication because of Depo-Provera.  This is a birth control medication that will slow down your bleeding.  Please let me know if you start having worsening bleeding over the next few days.  Please let me know if your symptoms do not improve over the next 1 to 2 weeks.  Take care, Dr [name]

## 2018-06-11 NOTE — Assessment & Plan Note (Addendum)
Likely secondary to fibroid uterus.  Her vital signs are stable and she does not have any red flag signs or symptoms today.  Her hemoglobin is little lower at 9.0 compared to 10.3 from a few months ago.  Her urine pregnancy test is negative.  Instructed patient to continue using iron supplementation.  We will give Depo-Provera injection today per patient request.  Discussed possible side effects of this medication including bloating, weight gain, and irregular vaginal bleeding.  Patient does not plan on getting pregnant in the near future.  Discussed reasons to return to care.  She will be having a myomectomy about 4 months to her OB/GYN.

## 2018-06-11 NOTE — Progress Notes (Signed)
   Subjective:  Peggy Schmidt is a 37 y.o. female who presents today for same-day appointment with a chief complaint of vaginal bleeding.   HPI:  Vaginal Bleeding, acute problem Started over 3 weeks ago.  Patient with severe, having bleeding.  Bleeding is so severe that she has resorted to wearing a tampon in addition to 2 pads at a time.  She has passed several large blood clots.  Has a history of fibroids about 4 months.  No obvious alleviating or aggravating factors.  She has had some lightheadedness.  Some increased shortness of breath.  No syncope.  No chest pain.  She is taking iron supplementation, but does not think that it helped significantly.  Symptoms been constant for the last 3 weeks.  ROS: Per HPI  PMH: She reports that she has never smoked. She has never used smokeless tobacco. She reports that she does not drink alcohol or use drugs.  Objective:  Physical Exam: BP 130/78 (BP Location: Left Arm, Patient Position: Sitting, Cuff Size: Large)   Pulse 90   Temp 98.2 F (36.8 C) (Oral)   Ht 5\' 5"  (1.651 m)   Wt 245 lb 12.8 oz (111.5 kg)   SpO2 99%   BMI 40.90 kg/m   Gen: NAD, resting comfortably CV: RRR with no murmurs appreciated Pulm: NWOB, CTAB with no crackles, wheezes, or rhonchi GI: Normal bowel sounds present. Soft, Nontender, Nondistended.  Results for orders placed or performed in visit on 06/11/18 (from the past 24 hour(s))  POCT hemoglobin     Status: Abnormal   Collection Time: 06/11/18  1:25 PM  Result Value Ref Range   Hemoglobin 9.0 (A) 12.2 - 16.2 g/dL  POCT urine pregnancy     Status: None   Collection Time: 06/11/18  1:36 PM  Result Value Ref Range   Preg Test, Ur Negative Negative    Assessment/Plan:  Dysfunctional uterine bleeding Likely secondary to fibroid uterus.  Her vital signs are stable and she does not have any red flag signs or symptoms today.  Her hemoglobin is little lower at 9.0 compared to 10.3 from a few months ago.  Her urine  pregnancy test is negative.  Instructed patient to continue using iron supplementation.  We will give Depo-Provera injection today per patient request.  Discussed possible side effects of this medication including bloating, weight gain, and irregular vaginal bleeding.  Patient does not plan on getting pregnant in the near future.  Discussed reasons to return to care.  She will be having a myomectomy about 4 months to her OB/GYN.  Algis Greenhouse. [name], MD 06/11/2018 3:10 PM

## 2018-06-13 ENCOUNTER — Ambulatory Visit: Payer: Self-pay

## 2018-06-13 ENCOUNTER — Telehealth: Payer: Self-pay

## 2018-06-13 NOTE — Telephone Encounter (Signed)
Noted. Sounds good. She should come in if symptoms return or worsen.  Algis Greenhouse. [name], MD 06/13/2018 11:10 AM

## 2018-06-13 NOTE — Telephone Encounter (Signed)
FYI.  Home care advice was given.

## 2018-06-13 NOTE — Telephone Encounter (Signed)
See note

## 2018-06-13 NOTE — Telephone Encounter (Signed)
Copied from [place] [phone]. Topic: General - Other >> Jun 13, 2018  9:21 AM Judyann Munson wrote: Reason for CRM: patient is calling stated she received a  Depo-Provera injection and is experience some side effects, and she wants to speak to the nurse

## 2018-06-13 NOTE — Telephone Encounter (Signed)
Attempted to call patient and left VM.  CRM in place.  If patient calls back, she should be triaged.

## 2018-06-13 NOTE — Telephone Encounter (Signed)
Copied from [place] [id number]. Topic: General - Other >> Jun 13, 2018  9:21 AM Judyann Munson wrote: Reason for CRM: patient is calling stated she received a  Depo-Provera injection and is experience some side effects, and she wants to speak to the nurse

## 2018-06-13 NOTE — Telephone Encounter (Signed)
Patient called in with c/o "medication reaction." She says "I took the depo shot on Monday and I was fine. Tuesday I felt a little lightheaded, but it passed. Yesterday I ate all day with no problems, the vaginal bleeding has slowed down, not as bad. Around midnight, I got so sick. I vomited everything I ate, even water wouldn't stay down. The vomiting lasted off and on from MN to 3 am. I had a bowel movement during this and it was black. It was 2 small balls and when I wiped, the tissue had black on it." I asked is she taking iron pills or eat anything that would turn the stool colors, she says "I take an iron pill everyday, but I've been on it for months and this has never happened before. I don't think I ate anything like that." I asked about other symptoms, abdominal pain, nausea, vomiting now, she says "no. I'm drinking now and feel fine." According to protocol, home care advice. I advised her to monitor her stools for the next 24 hours and her vomiting. If the vomiting returns, nausea, black stools with the next few bowel movements over the next 24 hours, call the office back to be triaged again, she verbalized understanding.  Reason for Disposition . [1] Abnormal color is unexplained AND [2] present < 24 hours  Answer Assessment - Initial Assessment Questions 1. COLOR: "What color is it?" "Is that color in part or all of the stool?"     Black 2. ONSET: "When was the unusual color first noted?"     Today 3. CAUSE: "Have you eaten any food or taken any medicine of this color?" (See listing in BACKGROUND)     No 4. OTHER SYMPTOMS: "Do you have any other symptoms?" (e.g., diarrhea, jaundice, abdominal pain, fever).     Vomiting for 3 hours off and on in the middle of night  Protocols used: [place]

## 2018-06-28 ENCOUNTER — Inpatient Hospital Stay: Admit: 2018-06-28 | Payer: BLUE CROSS/BLUE SHIELD | Admitting: Obstetrics and Gynecology

## 2018-06-28 SURGERY — MYOMECTOMY, ABDOMINAL APPROACH
Anesthesia: General

## 2018-07-13 ENCOUNTER — Telehealth: Payer: Self-pay | Admitting: Family Medicine

## 2018-07-13 NOTE — Telephone Encounter (Signed)
New Rx should not cause increased urination. Checking BP? Edema? Okay to continue until Monday and recheck with Dr. [name] if stable.

## 2018-07-13 NOTE — Telephone Encounter (Signed)
Called and left voicemail msg on pt's cell phone to return my call

## 2018-07-13 NOTE — Telephone Encounter (Signed)
Called and spoke with patient.  Pt denies any edema and she has not been monitoring her BP at home.  Advised per Dr. Juleen China ok for her to continue taking rx with new dosage and recheck with Dr. [name] if stable.  Pt verbalized understanding.

## 2018-07-13 NOTE — Telephone Encounter (Signed)
Spoke to pt.  At 6/14 visit, she was taking olmesartan-HCTZ 40-12.5 mg daily.  Per pt's request of wanting to decrease her BP meds, dose was decreased to 20-12.5mg  qd.  Pt states that she just started new dose yesterday 7/25.  She was taking the remainder of her old rx and states she feels the new dose is causing her to have excessive urinary frequency.  Pt denies symptoms of dysuria, burning or pelvic pressure.  She is questioning if she needs to go back on original strength prescribed.    Please advise.

## 2018-07-13 NOTE — Telephone Encounter (Signed)
See note  Copied from [place] [id number]. Topic: General - Other >> Jul 13, 2018  8:59 AM Judyann Munson wrote: Reason for CRM:  patient is requesting  a call back in regards to her new medication  olmesartan-hydrochlorothiazide (BENICAR HCT) 20-12.5 MG tablet. She wants a nurse to give her a call back in regards to this medicine. Please advise

## 2018-07-17 NOTE — Telephone Encounter (Signed)
Noted  

## 2018-08-29 ENCOUNTER — Ambulatory Visit: Payer: Self-pay | Admitting: *Deleted

## 2018-08-29 ENCOUNTER — Telehealth: Payer: Self-pay | Admitting: Family Medicine

## 2018-08-29 NOTE — Telephone Encounter (Signed)
See note

## 2018-08-29 NOTE — Telephone Encounter (Signed)
LM on patient's voicemail with the below information.

## 2018-08-29 NOTE — Telephone Encounter (Signed)
Pt calling with complaints of chest pain that feels like a sharp pain/ bad cramp that goes from the right breast to the middle of chest. Pt states she initially felt it last night and it went away and then she felt it again this morning and is currently feeling the sensation while talking on the phone with triage nurse. Pt states that the pain goes from the right to left side. Pt states she did eat a hamburger last night and tried to make her self throw to see if symptoms would be relieved but it did not work. Pt states she did not try any over the counter medications last night to help with pain. Pt denies any difficulty breathing, nausea or dizziness at this time. Pt states that she feels the pain with just sitting and currently rates pain 8.5. Pt states that the pain does not increase in frequency or severity.Pt advised that she would need to be seen in the next 24 hours. Pt states she has a new job and her work scheduled is from 7 am-5 pm, so she is unsure is she will be able to come in for an appt. Pt states she will call back on tomorrow once she talks with her boss to see if appt was available. Pt advised to seek care in ED if symptoms become worse or if she was unable to come into the office for an appt. Pt verbalized understanding.  Reason for Disposition . [1] Chest pain lasting <= 5 minutes AND [2] NO chest pain or cardiac symptoms now(Exceptions: pains lasting a few seconds)  Answer Assessment - Initial Assessment Questions 1. LOCATION: "Where does it hurt?"       Right breast and middle of chest 2. RADIATION: "Does the pain go anywhere else?" (e.g., into neck, jaw, arms, back)     No 3. ONSET: "When did the chest pain begin?" (Minutes, hours or days)      Last night 4. PATTERN "Does the pain come and go, or has it been constant since it started?"  "Does it get worse with exertion?"      Comes and goes, and d 5. DURATION: "How long does it last" (e.g., seconds, minutes, hours)     Last a  few seconds 6. SEVERITY: "How bad is the pain?"  (e.g., Scale 1-10; mild, moderate, or severe)    - MILD (1-3): doesn't interfere with normal activities     - MODERATE (4-7): interferes with normal activities or awakens from sleep    - SEVERE (8-10): excruciating pain, unable to do any normal activities       8.5 7. CARDIAC RISK FACTORS: "Do you have any history of heart problems or risk factors for heart disease?" (e.g., prior heart attack, angina; high blood pressure, diabetes, being overweight, high cholesterol, smoking, or strong family history of heart disease)     HTN, overweight, high cholesterol; maybe grandmother and mother but not sure 8. PULMONARY RISK FACTORS: "Do you have any history of lung disease?"  (e.g., blood clots in lung, asthma, emphysema, birth control pills)     No 9. CAUSE: "What do you think is causing the chest pain?"      10. OTHER SYMPTOMS: "Do you have any other symptoms?" (e.g., dizziness, nausea, vomiting, sweating, fever, difficulty breathing, cough)       No 11. PREGNANCY: "Is there any chance you are pregnant?" "When was your last menstrual period?"       No pregnancy LMP-June 24th  Protocols  used: CHEST PAIN-A-AH

## 2018-08-29 NOTE — Telephone Encounter (Signed)
It is normal to have spotting for several weeks after depo. Typically improves after the first several weeks.  Would recommend patient discuss with the provider that gave her the depo to make sure they know what's going on.   Algis Greenhouse. [name], MD 08/29/2018 11:33 AM

## 2018-08-29 NOTE — Telephone Encounter (Addendum)
Copied from [place] [phone]. Topic: General - Other >> Aug 29, 2018  7:12 AM Lennox Solders wrote: Reason for CRM:pt is calling and she just started  on depo shot. Pt has been spotting for over 2 wks. Please advise. Pt would like to know if that is normal

## 2018-08-29 NOTE — Telephone Encounter (Signed)
Please advise 

## 2018-09-03 ENCOUNTER — Ambulatory Visit: Payer: Self-pay | Admitting: Family Medicine

## 2018-09-05 ENCOUNTER — Ambulatory Visit (INDEPENDENT_AMBULATORY_CARE_PROVIDER_SITE_OTHER): Payer: Self-pay | Admitting: Family Medicine

## 2018-09-05 ENCOUNTER — Encounter: Payer: Self-pay | Admitting: Family Medicine

## 2018-09-05 ENCOUNTER — Ambulatory Visit (INDEPENDENT_AMBULATORY_CARE_PROVIDER_SITE_OTHER): Payer: Self-pay

## 2018-09-05 VITALS — BP 134/80 | HR 92 | Temp 98.4°F | Ht 65.0 in | Wt 249.0 lb

## 2018-09-05 DIAGNOSIS — I10 Essential (primary) hypertension: Secondary | ICD-10-CM

## 2018-09-05 DIAGNOSIS — Z13220 Encounter for screening for lipoid disorders: Secondary | ICD-10-CM

## 2018-09-05 DIAGNOSIS — G8929 Other chronic pain: Secondary | ICD-10-CM

## 2018-09-05 DIAGNOSIS — M25561 Pain in right knee: Principal | ICD-10-CM

## 2018-09-05 DIAGNOSIS — D509 Iron deficiency anemia, unspecified: Secondary | ICD-10-CM

## 2018-09-05 DIAGNOSIS — R739 Hyperglycemia, unspecified: Secondary | ICD-10-CM

## 2018-09-05 DIAGNOSIS — M25562 Pain in left knee: Secondary | ICD-10-CM

## 2018-09-05 DIAGNOSIS — Z114 Encounter for screening for human immunodeficiency virus [HIV]: Secondary | ICD-10-CM

## 2018-09-05 DIAGNOSIS — N938 Other specified abnormal uterine and vaginal bleeding: Secondary | ICD-10-CM

## 2018-09-05 LAB — CBC
HCT: 39.1 % (ref 36.0–46.0)
Hemoglobin: 12.6 g/dL (ref 12.0–15.0)
MCHC: 32.3 g/dL (ref 30.0–36.0)
MCV: 78 fl (ref 78.0–100.0)
Platelets: 379 10*3/uL (ref 150.0–400.0)
RBC: 5.01 Mil/uL (ref 3.87–5.11)
RDW: 16.5 % — ABNORMAL HIGH (ref 11.5–15.5)
WBC: 5.4 10*3/uL (ref 4.0–10.5)

## 2018-09-05 LAB — COMPREHENSIVE METABOLIC PANEL
ALT: 14 U/L (ref 0–35)
AST: 12 U/L (ref 0–37)
Albumin: 4.4 g/dL (ref 3.5–5.2)
Alkaline Phosphatase: 90 U/L (ref 39–117)
BUN: 10 mg/dL (ref 6–23)
CO2: 28 mEq/L (ref 19–32)
Calcium: 9.6 mg/dL (ref 8.4–10.5)
Chloride: 102 mEq/L (ref 96–112)
Creatinine, Ser: 0.81 mg/dL (ref 0.40–1.20)
GFR: 103.28 mL/min (ref >60.00)
Glucose, Bld: 199 mg/dL — ABNORMAL HIGH (ref 70–99)
Potassium: 3.9 mEq/L (ref 3.5–5.1)
Sodium: 138 mEq/L (ref 135–145)
Total Bilirubin: 0.5 mg/dL (ref 0.2–1.2)
Total Protein: 7.4 g/dL (ref 6.0–8.3)

## 2018-09-05 LAB — LIPID PANEL
Cholesterol: 194 mg/dL (ref 0–200)
HDL: 38.8 mg/dL — ABNORMAL LOW (ref >39.00)
LDL Cholesterol: 139 mg/dL — ABNORMAL HIGH (ref 0–99)
NonHDL: 155.09
Total CHOL/HDL Ratio: 5
Triglycerides: 81 mg/dL (ref 0.0–149.0)
VLDL: 16.2 mg/dL (ref 0.0–40.0)

## 2018-09-05 LAB — TSH: TSH: 0.69 u[IU]/mL (ref 0.35–4.50)

## 2018-09-05 LAB — HEMOGLOBIN A1C: Hgb A1c MFr Bld: 7.1 % — ABNORMAL HIGH (ref 4.6–6.5)

## 2018-09-05 MED ORDER — DICLOFENAC SODIUM 75 MG PO TBEC: 75.0000 mg | DELAYED_RELEASE_TABLET | Freq: Two times a day (BID) | ORAL | 0 refills | Status: DC

## 2018-09-05 MED ORDER — MEDROXYPROGESTERONE ACETATE 150 MG/ML IM SUSP
150.0000 mg | Freq: Once | INTRAMUSCULAR | Status: AC
  Administered 2018-09-05: 150 mg via INTRAMUSCULAR

## 2018-09-05 NOTE — Assessment & Plan Note (Signed)
At goal on olmesartan-HCTZ.  Check CBC, CMET, TSH.

## 2018-09-05 NOTE — Assessment & Plan Note (Addendum)
Repeat depo-provera given today.  Will be following up with her OB/GYN soon for surgical intervention for her fibroids.  Check CBC.

## 2018-09-05 NOTE — Progress Notes (Signed)
   Subjective:  Peggy Schmidt is a 37 y.o. female who presents today with a chief complaint of knee pain.   HPI:  Knee Pain, chronic problem, worsening Symptoms started several months ago. She was seen earlier this year and started on a home exercise program, meloxicam, and instructed to use compression and ice.  Symptoms have continued to worsen.  Mobic did not help with her symptoms.  Symptoms are now worse in her left knee.  Has occasional clicking and popping.  Occasional swelling to the area.  Worse with standing and squatting.  No redness.  No reported weakness or numbness.  History of fibroids/abnormal uterine bleeding Was given a Depo-Provera shot 3 months ago which helped with her symptoms.  Request repeat injection today.  ROS: Per HPI  PMH: She reports that she has never smoked. She has never used smokeless tobacco. She reports that she does not drink alcohol or use drugs.  Objective:  Physical Exam: BP 134/80 (BP Location: Left Arm, Patient Position: Sitting, Cuff Size: Large)   Pulse 92   Temp 98.4 F (36.9 C) (Oral)   Ht 5\' 5"  (1.651 m)   Wt 249 lb (112.9 kg)   LMP 09/03/2018 (Within Days) Comment: Spotting  SpO2 98%   BMI 41.44 kg/m   Gen: NAD, resting comfortably MSK:  -Bilateral knees: Mild effusion bilaterally.  Palpable crepitus with passive extension.  No joint line tenderness.  Negative anterior and posterior drawer signs.  Negative [place].  Neurovascular intact distally.  Assessment/Plan:  Essential hypertension At goal on olmesartan-HCTZ.  Check CBC, CMET, TSH.  Dysfunctional uterine bleeding Repeat depo-provera given today.  Will be following up with her OB/GYN soon for surgical intervention for her fibroids.  Check CBC.  Microcytic anemia Check CBC and iron panel.   Bilateral knee pain Likely patellofemoral syndrome.  No red flag signs or symptoms.  Plain film today without obvious abnormality.  My read-we will await radiology read.  We will  start diclofenac.  Also discussed home exercise program.  Recommended compression and ice as needed.  Will place referral to physical therapy and sports medicine for further management.  Work note was given for patient to be on light duty for the next couple of weeks.  Hyperglycemia Check A1c.  Preventative health care Check lipid panel.  Check HIV antibody.  Flu shot declined.  Algis Greenhouse. [name], MD 09/05/2018 10:04 AM

## 2018-09-05 NOTE — Assessment & Plan Note (Signed)
Check CBC and iron panel. 

## 2018-09-05 NOTE — Patient Instructions (Signed)
It was very nice to see you today!  I think you have patellofemoral syndrome. Please use ice and compression as needed. Use the diclofenac as needed. I will refer you to physical therapy and sports medicine.  We will check blood work today.  We will give you another depo shot today as well.   Take care, Dr [name]

## 2018-09-05 NOTE — Assessment & Plan Note (Signed)
Likely patellofemoral syndrome.  No red flag signs or symptoms.  Plain film today without obvious abnormality.  My read-we will await radiology read.  We will start diclofenac.  Also discussed home exercise program.  Recommended compression and ice as needed.  Will place referral to physical therapy and sports medicine for further management.  Work note was given for patient to be on light duty for the next couple of weeks.

## 2018-09-06 ENCOUNTER — Telehealth: Payer: Self-pay | Admitting: Family Medicine

## 2018-09-06 LAB — IRON,TIBC AND FERRITIN PANEL
%SAT: 14 % (calc) — ABNORMAL LOW (ref 16–45)
Ferritin: 19 ng/mL (ref 16–154)
Iron: 48 ug/dL (ref 40–190)
TIBC: 337 mcg/dL (calc) (ref 250–450)

## 2018-09-06 LAB — HIV ANTIBODY (ROUTINE TESTING W REFLEX): HIV 1&2 Ab, 4th Generation: NONREACTIVE

## 2018-09-06 NOTE — Telephone Encounter (Signed)
See note.   Copied from [place] [phone]. Topic: General - Other >> Sep 06, 2018 11:55 AM Carolyn Stare wrote:  Pt call to say that her job would like to know how long do the doctor think her light duty is going to last

## 2018-09-06 NOTE — Progress Notes (Signed)
Please inform patient of the following:  Her iron levels are normal. Her thyroid level is normal. Electrolytes, kidney function, and liver function are all normal.  Blood counts are normal.  Her cholesterol was a little high and her blood sugar level was in the diabetic range. I would like for her to come back within the next week or so to discuss treatment options. If she is willing, she should start metformin xr 500mg  daily and lipitor 40mg  daily before our appointment. She should also continue working on diet and exercise. She should come back in 3 months to recheck her blood sugars.

## 2018-09-06 NOTE — Telephone Encounter (Signed)
The letter states 2 weeks.

## 2018-09-06 NOTE — Telephone Encounter (Signed)
LM for patient to return call.  CRM started.  Please inform patient that the letter states she is to be on light duty for 2 weeks from the date of the letter.  If she is not feeling better in 2 weeks, she should come back for another visit, but she should follow up with Dr. Paulla Fore in the mean time.

## 2018-09-06 NOTE — Progress Notes (Signed)
Dr Marigene Ehlers interpretation of your lab work:  The radiologist reviewed your xray and did not find any other abnormalities. Please take the voltaren as needed and work on the exercises on the handout. Please keep your follow up appointment with Dr Paulla Fore.  If you have any additional questions, please give [place] a call or send [place] a message through [place].  Take care, Dr [name]

## 2018-09-07 ENCOUNTER — Encounter: Payer: Self-pay | Admitting: Sports Medicine

## 2018-09-07 ENCOUNTER — Ambulatory Visit (INDEPENDENT_AMBULATORY_CARE_PROVIDER_SITE_OTHER): Payer: Self-pay | Admitting: Sports Medicine

## 2018-09-07 ENCOUNTER — Ambulatory Visit: Payer: Self-pay

## 2018-09-07 VITALS — BP 114/88 | HR 84 | Ht 65.0 in | Wt 248.2 lb

## 2018-09-07 DIAGNOSIS — G8929 Other chronic pain: Secondary | ICD-10-CM

## 2018-09-07 DIAGNOSIS — M25562 Pain in left knee: Secondary | ICD-10-CM

## 2018-09-07 NOTE — Progress Notes (Signed)
Peggy Schmidt. [place], [facility] [place]  AMAIAH Schmidt - 37 y.o. female MRN [id number]  Date of birth: 08/21/1981  Visit Date: 09/07/2018  PCP: Vivi Barrack, MD   Referred by: Vivi Barrack, MD   Scribe(s) for today's visit: Wendy Poet, LAT, ATC  SUBJECTIVE:  Peggy Schmidt is here for New Patient (Initial Visit) (L knee pain)  Referred by: Dr. [name]  HPI: Her B knee (L>R) pain symptoms INITIALLY: Began about 5-6 months w/ no known MOI Described as moderate aching pain, nonradiating.  She describes a sensation of heaviness in her L knee. Worsened with standing, ascending/descending stairs and squatting Improved with rest and ice Additional associated symptoms include: intermittent swelling and mechanical symptoms noted; some "numbness" noted in the L knee    At this time symptoms show no change compared to onset. She has been prescribed Voltaren 75 mg bid but has not picked it up yet.  She has tried ice.  REVIEW OF SYSTEMS: Denies night time disturbances. Denies fevers, chills, or night sweats. Denies unexplained weight loss. Denies personal history of cancer. Denies changes in bowel or bladder habits. Denies recent unreported falls. Denies new or worsening dyspnea or wheezing. Denies headaches or dizziness.  Reports numbness, tingling or weakness  In the extremities - in L patella Denies dizziness or presyncopal episodes Denies lower extremity edema    HISTORY:  Prior history reviewed and updated per electronic medical record.  Social History   Occupational History  . Not on file  Tobacco Use  . Smoking status: Never Smoker  . Smokeless tobacco: Never Used  Substance and Sexual Activity  . Alcohol use: No    Alcohol/week: 0.0 standard drinks  . Drug use: No  . Sexual activity: Not on file   Social History   Social History Narrative  . Not on file    Past Medical History:  Diagnosis  Date  . Asthma   . GERD (gastroesophageal reflux disease)   . Hay fever   . Hypertension    pt states her blood pressure has been up and down for the last year  . Post concussion syndrome 2017   Past Surgical History:  Procedure Laterality Date  . ANKLE FRACTURE SURGERY     family history includes Depression in her mother; Diabetes in her father and mother; Hyperlipidemia in her mother; Hypertension in her brother, father, maternal grandmother, and mother.  DATA OBTAINED & REVIEWED:   Recent Labs    09/05/18 0911  HGBA1C 7.1*   . 09/05/2018: Three-view x-ray of the bilateral knees : Some loss of medial joint space but otherwise unremarkable and no acute findings. .   OBJECTIVE:  VS:  HT:5\' 5"  (165.1 cm)   WT:248 lb 3.2 oz (112.6 kg)  BMI:41.3    BP:114/88  HR:84bpm  TEMP: ( )  RESP:98 %   PHYSICAL EXAM: CONSTITUTIONAL: Well-developed, Well-nourished and In no acute distress PSYCHIATRIC: Alert & appropriately interactive. and Not depressed or anxious appearing. RESPIRATORY: No increased work of breathing and Trachea Midline EYES: Pupils are equal., EOM intact without nystagmus. and No scleral icterus.  VASCULAR EXAM: Warm and well perfused NEURO: unremarkable Normal associated myotomal distribution strength to manual muscle testing Normal sensation to light touch  MSK Exam: Left knee  Well aligned, no significant deformity. No overlying skin changes. TTP over Medial joint line.  No significant pain over patellar facets or lateral joint line.   RANGE  OF MOTION & STRENGTH  Full flexion and extension of the knee.   SPECIALITY TESTING:  Positive McMurray's on the right,  No significant effusion but generalized synovitis.  Ligamentously stable to varus and valgus straining, anterior posterior drawer.      ASSESSMENT   1. Chronic pain of left knee     PLAN:  Pertinent additional documentation may be included in corresponding procedure notes, imaging studies,  problem based documentation and patient instructions.  Procedures:  . US Guided Injection per procedure note . Discussed the foundation of treatment for this condition is physical therapy and/or daily (5-6 days/week) therapeutic exercises, focusing on core strengthening, coordination, neuromuscular control/reeducation.  Therapeutic exercises prescribed per procedure note.  Medications:  No orders of the defined types were placed in this encounter.  Discussion/Instructions: No problem-specific Assessment & Plan notes found for this encounter.  Marland Kitchen Ultrasound-guided injection performed today. . Brace/supportive device provided per AVS. DonJoy reaction knee brace. . Work / School note with recommendations provided.  Return to full duty on Monday. . Discussed red flag symptoms that warrant earlier emergent evaluation and patient voices understanding. . Activity modifications and the importance of avoiding exacerbating activities (limiting pain to no more than a 4 / 10 during or following activity) recommended and discussed.  Follow-up:  . Return in about 6 weeks (around 10/19/2018).   . If any lack of improvement consider: further diagnostic evaluation with MRI of the left knee.  .      CMA/ATC served as Education administrator during this visit. History, Physical, and Plan performed by medical provider. Documentation and orders reviewed and attested to.      [name], [facility] Physician

## 2018-09-07 NOTE — Progress Notes (Signed)

## 2018-09-07 NOTE — Patient Instructions (Addendum)

## 2018-09-07 NOTE — Procedures (Signed)
PROCEDURE NOTE:  Ultrasound Guided: Injection: Right knee Images were obtained and interpreted by myself, Teresa Coombs, DO  Images have been saved and stored to PACS system. Images obtained on: GE S7 Ultrasound machine    ULTRASOUND FINDINGS:  Supraphysiologic effusion.  Degenerative bulging of the medial and lateral meniscus.  DESCRIPTION OF PROCEDURE:  The patient's clinical condition is marked by substantial pain and/or significant functional disability. Other conservative therapy has not provided relief, is contraindicated, or not appropriate. There is a reasonable likelihood that injection will significantly improve the patient's pain and/or functional impairment.   After discussing the risks, benefits and expected outcomes of the injection and all questions were reviewed and answered, the patient wished to undergo the above named procedure.  Verbal consent was obtained.  The ultrasound was used to identify the target structure and adjacent neurovascular structures. The skin was then prepped in sterile fashion and the target structure was injected under direct visualization using sterile technique as below:  Single injection performed as below: PREP: Alcohol and Ethel Chloride APPROACH:superiolateral, single injection, 25g 1.5 in. INJECTATE: 2 cc 0.5% Marcaine and 2 cc 40mg /mL DepoMedrol ASPIRATE: None DRESSING: Band-Aid  Post procedural instructions including recommending icing and warning signs for infection were reviewed.    This procedure was well tolerated and there were no complications.   IMPRESSION: Succesful Ultrasound Guided: Injection

## 2018-09-10 NOTE — Telephone Encounter (Signed)
Patient returned my call and stated that she saw Dr. Paulla Fore on Friday 9/20 and released her to return to work w/o any restrictions.  Pt stated that her employer received note from Dr. Paulla Fore and that this was sufficient.  Advised if she needed anything further from [place] to please let us know.  She verbalized understanding.

## 2018-09-10 NOTE — Telephone Encounter (Signed)
Called patient's cell phone and left voicemail msg requesting call back.

## 2018-09-11 NOTE — Progress Notes (Signed)
Dr Marigene Ehlers interpretation of your lab work:  Your HIV test was negative.    If you have any additional questions, please give [place] a call or send [place] a message through [place].  Take care, Dr [name]

## 2018-09-12 ENCOUNTER — Ambulatory Visit: Payer: Self-pay | Admitting: Sports Medicine

## 2018-09-21 ENCOUNTER — Ambulatory Visit (INDEPENDENT_AMBULATORY_CARE_PROVIDER_SITE_OTHER): Payer: 59 | Admitting: Family Medicine

## 2018-09-21 ENCOUNTER — Telehealth: Payer: Self-pay

## 2018-09-21 ENCOUNTER — Encounter: Payer: Self-pay | Admitting: Family Medicine

## 2018-09-21 VITALS — BP 122/78 | HR 86 | Temp 98.9°F | Ht 65.0 in | Wt 252.0 lb

## 2018-09-21 DIAGNOSIS — G8929 Other chronic pain: Secondary | ICD-10-CM

## 2018-09-21 DIAGNOSIS — E785 Hyperlipidemia, unspecified: Secondary | ICD-10-CM | POA: Diagnosis not present

## 2018-09-21 DIAGNOSIS — R739 Hyperglycemia, unspecified: Secondary | ICD-10-CM | POA: Insufficient documentation

## 2018-09-21 DIAGNOSIS — M25562 Pain in left knee: Secondary | ICD-10-CM

## 2018-09-21 DIAGNOSIS — I10 Essential (primary) hypertension: Secondary | ICD-10-CM

## 2018-09-21 DIAGNOSIS — E1169 Type 2 diabetes mellitus with other specified complication: Secondary | ICD-10-CM | POA: Insufficient documentation

## 2018-09-21 MED ORDER — METFORMIN HCL ER 750 MG PO TB24: 750.0000 mg | ORAL_TABLET | Freq: Every day | ORAL | 3 refills | Status: DC

## 2018-09-21 MED ORDER — OLMESARTAN MEDOXOMIL-HCTZ 20-12.5 MG PO TABS: 1.0000 | ORAL_TABLET | Freq: Every day | ORAL | 11 refills | Status: DC

## 2018-09-21 NOTE — Assessment & Plan Note (Signed)
At goal today.  Continue olmesartan-HCTZ.  Follow-up in 3 months.

## 2018-09-21 NOTE — Assessment & Plan Note (Addendum)
Patient has failed conservative management.  We will obtain the MRI of her knee per sports medicine note.  Depending on results, may need referral to orthopedics.  Recommended use of diclofenac as needed.

## 2018-09-21 NOTE — Patient Instructions (Addendum)
It was very nice to see you today!  Please start the metformin.  Please work on being active and eating a healthy diet.  Start the diclofenac.  We will schedule an MRI of your knee and contact you with the results when they are available.   Come back to see me in 3 months, or sooner as needed.   Take care, Dr [name]

## 2018-09-21 NOTE — Telephone Encounter (Signed)
Spoke to Ms. Peggy Schmidt regarding the PREP at [employer].  She is concerned about the cost which was explained to her to include her membership fee, training, and educational classes.  Also made her aware that upon completion, if she wanted to continue at the [facility], her enrollment fees would be waived ($70) and that she could draft the cost over 3 months.  She was also made aware of the scholarship opportunities through the [employer].  She expressed interest but wants to wait until January of 2020.  We agreed to touch base towards the end of this year to discuss start times for new classes in January.

## 2018-09-21 NOTE — Progress Notes (Signed)
   Subjective:  Peggy Schmidt is a 37 y.o. female who presents today with a chief complaint of knee pain.   HPI:  Knee Pain, chronic problem, stable Seen about a month ago this.  She also has seen sports medicine in the interim.  Had a intra-articular steroid injection performed about 2 weeks ago with no significant improvement in her pain. She needs to go back to work however still has quite a bit of pain when standing and lifting.  Pain is worse in her left knee.  She has not yet tried the diclofenac.  No other obvious alleviating or aggravating factors.  Pain is stable.  Hyperglycemia, new problem Patient found to have hemoglobin A1c of 7.1 on blood work from a few weeks ago.  She has never been diagnosed with diabetes in the past.  She is interested in possibly starting medication today.  Dyslipidemia, new problem Patient also found to have elevated LDL and her last lipid screening.  She has not interested in starting medication today.  ROS: Per HPI  PMH: She reports that she has never smoked. She has never used smokeless tobacco. She reports that she does not drink alcohol or use drugs.  Objective:  Physical Exam: BP 122/78 (BP Location: Left Arm, Patient Position: Sitting, Cuff Size: Large)   Pulse 86   Temp 98.9 F (37.2 C) (Oral)   Ht 5\' 5"  (1.651 m)   Wt 252 lb (114.3 kg)   LMP 09/03/2018 (Within Days) Comment: Spotting  SpO2 98%   BMI 41.93 kg/m   Gen: NAD, resting comfortably CV: RRR with no murmurs appreciated Pulm: NWOB, CTAB with no crackles, wheezes, or rhonchi MSK:  -Left knee: Mild effusion noted.  Palpable crepitus with passive extension.  Assessment/Plan:  Morbid obesity (HCC) BMI 41.9 today.  Discussed lifestyle modifications.  Also discussed medication management including GLP-1 agonist.  Patient will start metformin today for her hyperglycemia.  This should help some with weight loss.  She will also try to get back into exercising regularly.  She has a  Higher education careers adviser at [employer].  Gave handout with information for [employer] as well.  Follow-up with me in 3 months.  Hyperglycemia Start metformin 750 mg daily.  Follow-up with me in 3 months.  Discussed other treatment options including GLP-1 agonist however patient declined.  Check A1c at next office visit.  Discussed diet and exercise modifications as well.  Essential hypertension At goal today.  Continue olmesartan-HCTZ.  Follow-up in 3 months.  Dyslipidemia Recommended statin therapy however patient declined.  She will work on diet and exercise.  We can recheck in 6 to 12 months.  Chronic pain of left knee Patient has failed conservative management.  We will obtain the MRI of her knee per sports medicine note.  Depending on results, may need referral to orthopedics.   Algis Greenhouse. [name], MD 09/21/2018 12:09 PM

## 2018-09-21 NOTE — Assessment & Plan Note (Signed)
BMI 41.9 today.  Discussed lifestyle modifications.  Also discussed medication management including GLP-1 agonist.  Patient will start metformin today for her hyperglycemia.  This should help some with weight loss.  She will also try to get back into exercising regularly.  She has a Higher education careers adviser at [employer].  Gave handout with information for [employer] as well.  Follow-up with me in 3 months.

## 2018-09-21 NOTE — Assessment & Plan Note (Signed)
Start metformin 750 mg daily.  Follow-up with me in 3 months.  Discussed other treatment options including GLP-1 agonist however patient declined.  Check A1c at next office visit.  Discussed diet and exercise modifications as well.

## 2018-09-21 NOTE — Assessment & Plan Note (Signed)
Recommended statin therapy however patient declined.  She will work on diet and exercise.  We can recheck in 6 to 12 months.

## 2018-09-26 ENCOUNTER — Ambulatory Visit: Payer: Self-pay | Admitting: Family Medicine

## 2018-09-26 ENCOUNTER — Encounter: Payer: Self-pay | Admitting: Family Medicine

## 2018-09-26 DIAGNOSIS — Z0289 Encounter for other administrative examinations: Secondary | ICD-10-CM

## 2018-09-28 ENCOUNTER — Inpatient Hospital Stay: Admit: 2018-09-28 | Payer: BLUE CROSS/BLUE SHIELD | Admitting: Obstetrics and Gynecology

## 2018-09-28 SURGERY — MYOMECTOMY, ABDOMINAL APPROACH
Anesthesia: General

## 2018-10-07 ENCOUNTER — Ambulatory Visit
Admission: RE | Admit: 2018-10-07 | Discharge: 2018-10-07 | Disposition: A | Discharge status: Home / Self Care | Payer: 59 | Source: Ambulatory Visit | Attending: Family Medicine | Admitting: Family Medicine

## 2018-10-07 DIAGNOSIS — M25562 Pain in left knee: Principal | ICD-10-CM

## 2018-10-07 DIAGNOSIS — G8929 Other chronic pain: Secondary | ICD-10-CM

## 2018-10-08 NOTE — Progress Notes (Signed)
Please inform patient of the following:  Her MRI shows a things that could explain her symptoms. She has mild displacement of her knee cap and a small amount of degenerative changes. She also has impingement in part of the soft tissue in her knee. I would like for her either to go back to Dr Paulla Fore to discuss further or refer her to orthopedics - which ever she prefers.  Peggy Schmidt. [name], MD 10/08/2018 9:26 AM

## 2018-10-09 ENCOUNTER — Other Ambulatory Visit: Payer: Self-pay

## 2018-10-09 DIAGNOSIS — M25562 Pain in left knee: Secondary | ICD-10-CM

## 2018-10-17 ENCOUNTER — Encounter: Payer: Self-pay | Admitting: Sports Medicine

## 2018-10-17 ENCOUNTER — Ambulatory Visit: Payer: Self-pay | Admitting: Sports Medicine

## 2018-10-17 ENCOUNTER — Ambulatory Visit (INDEPENDENT_AMBULATORY_CARE_PROVIDER_SITE_OTHER): Payer: Self-pay | Admitting: Orthopaedic Surgery

## 2018-10-17 DIAGNOSIS — Z0289 Encounter for other administrative examinations: Secondary | ICD-10-CM

## 2018-10-24 ENCOUNTER — Telehealth: Payer: Self-pay | Admitting: Family Medicine

## 2018-10-24 NOTE — Telephone Encounter (Signed)
See note

## 2018-10-24 NOTE — Telephone Encounter (Signed)
Copied from [place] [id number]. Topic: General - Other >> Oct 24, 2018 11:01 AM Cecelia Byars, NT wrote: Reason for CRM: Patient would like a return call concerning her A1C before she begins her medication  [phone]

## 2018-10-25 NOTE — Telephone Encounter (Signed)
See note

## 2018-10-25 NOTE — Telephone Encounter (Signed)
Patient returning call to [employer].

## 2018-10-25 NOTE — Telephone Encounter (Signed)
LM for patient to return call.

## 2018-10-26 NOTE — Telephone Encounter (Signed)
Pt calling back to discuss A1C. Pt also wants to know why she keeps spotting even after she is on the depo inj?  Pt has been spotting 2 wks Please call back asap

## 2018-10-26 NOTE — Telephone Encounter (Signed)
Please advise 

## 2018-10-29 ENCOUNTER — Other Ambulatory Visit: Payer: Self-pay

## 2018-10-29 DIAGNOSIS — N938 Other specified abnormal uterine and vaginal bleeding: Secondary | ICD-10-CM

## 2018-10-29 NOTE — Telephone Encounter (Signed)
This has been routed to Dr. [name] for his review.  Will contact patient after Dr. [name] has reviewed.

## 2018-10-29 NOTE — Telephone Encounter (Signed)
Again left message for patient to return call.  Please relay message from Dr. [name] below.  Also left this on patient's voicemail.  Referral has been placed.  CRM placed.

## 2018-10-29 NOTE — Telephone Encounter (Signed)
Her A1c is in the diabetic range. Would like for her to start metformin as we discussed at her OV.  It is common to have breakthrough bleeding or spotting with depo. Would recommend referral to OBGYN to discuss options if this is problematic for her.   Algis Greenhouse. [name], MD 10/29/2018 2:30 PM

## 2018-10-29 NOTE — Telephone Encounter (Signed)
Pt calling back to speak to amber. Please advise Cb#[phone]

## 2018-10-29 NOTE — Telephone Encounter (Signed)
See note

## 2018-10-30 ENCOUNTER — Encounter: Payer: Self-pay | Admitting: Obstetrics and Gynecology

## 2018-10-30 NOTE — Telephone Encounter (Signed)
Patient called back and is aware of below message. She said she she is sorry that she has not been able to answer when [employer] calls.

## 2018-10-30 NOTE — Telephone Encounter (Signed)
See note

## 2018-10-30 NOTE — Telephone Encounter (Signed)
Noted  

## 2018-11-02 ENCOUNTER — Ambulatory Visit: Payer: 59 | Admitting: Family Medicine

## 2018-11-07 ENCOUNTER — Other Ambulatory Visit: Payer: Self-pay

## 2018-11-07 ENCOUNTER — Encounter: Payer: Self-pay | Admitting: Obstetrics and Gynecology

## 2018-11-07 ENCOUNTER — Ambulatory Visit (INDEPENDENT_AMBULATORY_CARE_PROVIDER_SITE_OTHER): Payer: 59 | Admitting: Obstetrics and Gynecology

## 2018-11-07 VITALS — BP 122/84 | HR 80 | Ht 65.0 in | Wt 247.4 lb

## 2018-11-07 DIAGNOSIS — Z862 Personal history of diseases of the blood and blood-forming organs and certain disorders involving the immune mechanism: Secondary | ICD-10-CM

## 2018-11-07 DIAGNOSIS — E119 Type 2 diabetes mellitus without complications: Secondary | ICD-10-CM | POA: Insufficient documentation

## 2018-11-07 DIAGNOSIS — Z86018 Personal history of other benign neoplasm: Secondary | ICD-10-CM

## 2018-11-07 DIAGNOSIS — N921 Excessive and frequent menstruation with irregular cycle: Secondary | ICD-10-CM | POA: Diagnosis not present

## 2018-11-07 MED ORDER — ESTRADIOL 2 MG PO TABS: 2.0000 mg | ORAL_TABLET | Freq: Every day | ORAL | 0 refills | Status: DC

## 2018-11-07 NOTE — Progress Notes (Addendum)
37 y.o. G0P0000 Single Black or African American Not Hispanic or Latino female here for a consultation from Dr Dimas Chyle for  heavy menstrual bleeding with clotting. She was started on Depo Provera in June due to ongoing bleeding for over a month. Last injection given in September. Started bleeding again last week.   Prior to the depo-provera bleeding was mainly monthly x 6 days. She would bleed through the super + tampon in up to 40 minutes at a time for up to 3 days of her cycle. No significant cramps. She developed anemia, hgb of 10.3 in 2/19. She was seen by a GYN and had an ultrasound in the spring, was told she had fibroids and polyps. Endometrial biopsy was okay. Tried lysteda, then aygestin. Neither worked. Her GYN spoke to her about hysteroscopy (?) and hysterectomy. She states that her gyn wouldn't see her any more secondary to an outstanding balance. Her Primary started her on Depo-provera on June 11, 2018. Her bleeding slowed down and stopped quickly. Second shot in September. Little spotting in October. Started spotting 2 weeks ago, starting bleeding like a cycle on 11/16. Since then she is bleeding heavier every other day. Going through a super tampon in 1.5-2 hours at times, or can be light. No pain. Currently her bleeding is light.  She dates women, no pregnancy risk. She isn't sure if she wants to have a baby, but doesn't want a hysterectomy. She has been with her partner x 4 years.   Labs reviewed: 09/05/18: normal TSH, HgbA1C of 7.1, ferritin 19, Hgb of 12.6, HIV negative.  Period Duration (Days): irregular,  bleeding for a week and a half at a time Period Pattern: (!) Irregular Menstrual Flow: Heavy Menstrual Control: Thin pad, Maxi pad Menstrual Control Change Freq (Hours): changes pad every 2 hours Dysmenorrhea: None  No LMP recorded (lmp unknown). (Menstrual status: Irregular Periods).          Sexually active: Yes.    The current method of family planning is Depo-Provera  injections.    Exercising: Yes.    gym Smoker:  No  Health Maintenance: Pap:  2019 WNL  History of abnormal Pap:  no TDaP:  2018 Gardasil: No   reports that she has never smoked. She has never used smokeless tobacco. She reports that she does not drink alcohol or use drugs. She is working, thinking about going to school for health information.   Past Medical History:  Diagnosis Date  . Abnormal uterine bleeding   . Anemia   . Asthma   . DM (diabetes mellitus), type 2 ([place])   . Fibroid   . GERD (gastroesophageal reflux disease)   . Hay fever   . Hypertension    pt states her blood pressure has been up and down for the last year  . Post concussion syndrome 2017    Past Surgical History:  Procedure Laterality Date  . ANKLE FRACTURE SURGERY      Current Outpatient Medications  Medication Sig Dispense Refill  . diclofenac (VOLTAREN) 75 MG EC tablet Take 1 tablet (75 mg total) by mouth 2 (two) times daily. 30 tablet 0  . medroxyPROGESTERone (DEPO-PROVERA) 150 MG/ML injection Inject 150 mg into the muscle every 3 (three) months.    . metFORMIN (GLUCOPHAGE XR) 750 MG 24 hr tablet Take 1 tablet (750 mg total) by mouth daily with breakfast. 30 tablet 3  . olmesartan-hydrochlorothiazide (BENICAR HCT) 20-12.5 MG tablet Take 1 tablet by mouth daily. 30 tablet 11  No current facility-administered medications for this visit.     Family History  Problem Relation Age of Onset  . Hypertension Mother   . Diabetes Mother   . Depression Mother   . Hyperlipidemia Mother   . Hypertension Brother   . Hypertension Maternal Grandmother   . Diabetes Father   . Hypertension Father     Review of Systems  Constitutional: Negative.   HENT: Negative.   Eyes: Negative.   Respiratory: Negative.   Cardiovascular: Negative.   Gastrointestinal: Negative.   Endocrine: Negative.   Genitourinary: Positive for menstrual problem and vaginal bleeding.  Musculoskeletal: Negative.   Skin:  Negative.   Allergic/Immunologic: Negative.   Neurological: Negative.   Hematological: Negative.   Psychiatric/Behavioral: Negative.     Exam:   BP 122/84 (BP Location: Right Arm, Patient Position: Sitting, Cuff Size: Normal)   Pulse 80   Ht 5\' 5"  (1.651 m)   Wt 247 lb 6.4 oz (112.2 kg)   LMP  (LMP Unknown) Comment: Depo Provera  BMI 41.17 kg/m   Weight change: @WEIGHTCHANGE @ Height:   Height: 5\' 5"  (165.1 cm)  Ht Readings from Last 3 Encounters:  11/07/18 5\' 5"  (1.651 m)  09/21/18 5\' 5"  (1.651 m)  09/07/18 5\' 5"  (1.651 m)    General appearance: alert, cooperative and appears stated age Head: Normocephalic, without obvious abnormality, atraumatic Neck: no adenopathy, supple, symmetrical, trachea midline and thyroid normal to inspection and palpation Lungs: clear to auscultation bilaterally Cardiovascular: regular rate and rhythm Abdomen: soft, non-tender; non distended,  no masses,  no organomegaly Extremities: extremities normal, atraumatic, no cyanosis or edema Skin: Skin color, texture, turgor normal. No rashes or lesions Lymph nodes: Cervical, supraclavicular, and axillary nodes normal. No abnormal inguinal nodes palpated Neurologic: Grossly normal   Pelvic: External genitalia:  no lesions              Urethra:  normal appearing urethra with no masses, tenderness or lesions              Bartholins and Skenes: normal                 Vagina: normal appearing vagina with normal color and discharge, no lesions              Cervix: no cervical motion tenderness and no lesions               Bimanual Exam:  Uterus:  anteverted, mobile, not tender, not appreciably enlarged, exam limited by BMI              Adnexa: no mass, fullness, tenderness               Rectovaginal: Confirms               Anus:  normal sphincter tone, no lesions  Chaperone was present for exam.  A:  Abnormal uterine bleeding on depo-provera. Recent normal TSH and CBC. At the moment bleeding is light,  patient states it has been going from light to heavy  H/O menorrhagia leading to anemia prior to depo-provera  H/O fibroid uterus and endometrial polyps  P:   Short course of estrogen to stop BTB  Continue with the depo-provera  Will get a copy of her ultrasound and endometrial biopsy  It sounds like she may need a hysteroscopy, polypectomy, D&C (?myomectomy)  Also discussed possible use of mirena IUD  Not a candidate for OCP's  May potentially want to have children in the future,  hysterectomy is not an option currently   CC: Dr Dimas Chyle Note sent  11/20/2018 Addendum: Records reviewed.  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

## 2018-11-09 ENCOUNTER — Encounter: Payer: Self-pay | Admitting: Family Medicine

## 2018-11-09 ENCOUNTER — Ambulatory Visit (INDEPENDENT_AMBULATORY_CARE_PROVIDER_SITE_OTHER): Payer: 59 | Admitting: Family Medicine

## 2018-11-09 VITALS — BP 126/84 | HR 95 | Temp 98.6°F | Ht 65.0 in | Wt 248.8 lb

## 2018-11-09 DIAGNOSIS — I10 Essential (primary) hypertension: Secondary | ICD-10-CM | POA: Diagnosis not present

## 2018-11-09 DIAGNOSIS — R739 Hyperglycemia, unspecified: Secondary | ICD-10-CM | POA: Diagnosis not present

## 2018-11-09 DIAGNOSIS — N938 Other specified abnormal uterine and vaginal bleeding: Secondary | ICD-10-CM | POA: Diagnosis not present

## 2018-11-09 NOTE — Assessment & Plan Note (Signed)
Discussed treatment plan with patient.  She will start Estrace 2 mg daily for the next 2 weeks as directed by her gynecologist.  She will be following up with gynecology soon to discuss further treatment options.  She will follow-up with me in 1 month for repeat depo injection.  Discussed reasons to return to care and seek emergent care.  She will continue taking over-the-counter iron supplementation as well.

## 2018-11-09 NOTE — Patient Instructions (Signed)
It was very nice to see you today!  Please start the estrace.  No med changes today.  Let me or Dr Talbert Nan know if your bleeding does not improve.  Come back in 1 month for your next depo and to recheck your A1c.  Take care, Dr [name]

## 2018-11-09 NOTE — Assessment & Plan Note (Signed)
At goal.  Continue Benicar 1 tablet daily.

## 2018-11-09 NOTE — Assessment & Plan Note (Signed)
Continue metformin 750 mg daily.  She will follow-up with me in 1 month and we will recheck A1c at that point.

## 2018-11-09 NOTE — Progress Notes (Signed)
   Subjective:  Peggy Schmidt is a 37 y.o. female who presents today with a chief complaint of abnormal uterine bleeding.   HPI:  Abnormal Uterine Bleeding, chronic problem Patient has had some breakthrough bleeding on Depo-Medrol. She was last given depomedrol on 09/05/2018. She saw GYN 2 days ago for breakthrough bleeding and was started on estrace 2mg  daily.  She has not yet started this medication.  Symptoms are stable.  No reported fatigue.  She is taking iron supplementation.  Hyperglycemia Patient found to have elevated A1c during last blood draw 2 months ago and was started on metformin 750mg  daily.  She has been compliant with his medication without side effects.  No diarrhea.  No polyuria or polydipsia.  HTN On olmesartan-HCTZ 20-12.5mg  daily.  Tolerating well.  No reported side effects.  No chest pain or shortness of breath.  ROS: Per HPI  PMH: She reports that she has never smoked. She has never used smokeless tobacco. She reports that she does not drink alcohol or use drugs.  Objective:  Physical Exam: BP 126/84 (BP Location: Left Arm, Patient Position: Sitting, Cuff Size: Large)   Pulse 95   Temp 98.6 F (37 C) (Oral)   Ht 5\' 5"  (1.651 m)   Wt 248 lb 12.8 oz (112.9 kg)   LMP  (LMP Unknown) Comment: Depo Provera  SpO2 98%   BMI 41.40 kg/m   Wt Readings from Last 3 Encounters:  11/09/18 248 lb 12.8 oz (112.9 kg)  11/07/18 247 lb 6.4 oz (112.2 kg)  09/21/18 252 lb (114.3 kg)  Gen: NAD, resting comfortably CV: RRR with no murmurs appreciated Pulm: NWOB, CTAB with no crackles, wheezes, or rhonchi GI: Normal bowel sounds present. Soft, Nontender, Nondistended. MSK: No edema, cyanosis, or clubbing noted Skin: Warm, dry Neuro: Grossly normal, moves all extremities Psych: Normal affect and thought content  Assessment/Plan:  Hyperglycemia Continue metformin 750 mg daily.  She will follow-up with me in 1 month and we will recheck A1c at that point.  Essential  hypertension At goal.  Continue Benicar 1 tablet daily.  Dysfunctional uterine bleeding Discussed treatment plan with patient.  She will start Estrace 2 mg daily for the next 2 weeks as directed by her gynecologist.  She will be following up with gynecology soon to discuss further treatment options.  She will follow-up with me in 1 month for repeat depo injection.  Discussed reasons to return to care and seek emergent care.  She will continue taking over-the-counter iron supplementation as well.  Algis Greenhouse. [name], MD 11/09/2018 10:59 AM

## 2018-11-20 ENCOUNTER — Telehealth: Payer: Self-pay | Admitting: Obstetrics and Gynecology

## 2018-11-20 NOTE — Telephone Encounter (Signed)
Please let the patient know that I reviewed her records. Endometrial polyps and a thickened endometrium were described on sonohysterogram and a 7+cm anterior fundal myoma was noted on ultrasound. It is not described that the myoma enters her uterine cavity. The other provider recommended abdominal myomectomy and hysteroscopy, D&C.  I think she should have a hysteroscopy, D&C. If the fibroid isn't deviating her cavity and is not likely contributing to her bleeding. The only reason to remove the myoma is if she is having bulk symptoms or pain. If she wanted the myoma removed I would refer her to Dr Kerin Perna, but again I wouldn't do that for her bleeding complaints. Please see how she would like to proceed and if she wants to schedule the hysteroscopy, polypectomy, D&C?

## 2018-11-23 ENCOUNTER — Telehealth: Payer: Self-pay

## 2018-11-23 NOTE — Telephone Encounter (Signed)
Left VM for Peggy Schmidt to call back about participating in one of the upcoming new PREP classes in Jan 2020.

## 2018-11-30 NOTE — Telephone Encounter (Signed)
Return call to patient. Per ROI, can leave message on voice mail which has number confirmation. Left message returning her call, have information she requested, here today till 430.

## 2018-11-30 NOTE — Telephone Encounter (Signed)
Call to patient. Apologized for delay in calling. Reviewed records results as provided by Dr Talbert Nan. Patient wants to proceed with hysteroscopy/D&C in January.  Advised will begin precert and have business office call her.   Routing to Dr Talbert Nan, Juluis Rainier.  Encounter closed.

## 2018-11-30 NOTE — Telephone Encounter (Signed)
Patient is calling requesting to speak with someone regarding the records that were requested. "I want to know if she knows what's wrong with me yet." I informed patient that we had received the records and will be getting back in touch with her.

## 2018-12-03 ENCOUNTER — Encounter: Payer: Self-pay | Admitting: Family Medicine

## 2018-12-03 ENCOUNTER — Ambulatory Visit (INDEPENDENT_AMBULATORY_CARE_PROVIDER_SITE_OTHER): Payer: Self-pay | Admitting: Family Medicine

## 2018-12-03 VITALS — BP 122/80 | HR 85 | Temp 99.4°F | Ht 65.0 in | Wt 248.0 lb

## 2018-12-03 DIAGNOSIS — E119 Type 2 diabetes mellitus without complications: Secondary | ICD-10-CM

## 2018-12-03 DIAGNOSIS — I10 Essential (primary) hypertension: Secondary | ICD-10-CM

## 2018-12-03 DIAGNOSIS — N938 Other specified abnormal uterine and vaginal bleeding: Secondary | ICD-10-CM

## 2018-12-03 MED ORDER — MEDROXYPROGESTERONE ACETATE 150 MG/ML IM SUSP
150.0000 mg | Freq: Once | INTRAMUSCULAR | Status: AC
  Administered 2018-12-03: 150 mg via INTRAMUSCULAR

## 2018-12-03 NOTE — Assessment & Plan Note (Signed)
Depo-Provera given today.  Will be undergoing hysteroscopy and D&C in about 3 weeks via OBGYN.

## 2018-12-03 NOTE — Progress Notes (Signed)
   Subjective:  Peggy Schmidt is a 37 y.o. female who presents today with a chief complaint of dysmenorrhea.   HPI:  Dysmenorrhea, chronic problem Last seen about a month ago for this. At that visit, she was just started on estrace by OBGYN.  Her symptoms significantly improved for 2 weeks however the last 3 days she has had recurrence of heavy bleeding.  She is passing some clots.  No chest pain.  No shortness of breath.  No lightheadedness.  No treatments tried.  Her other chronic, stable medical conditions are outlined below: 1.  Type 2 diabetes.  On metformin 750 mg daily and tolerating well.  No reported polyuria or polydipsia.  No reported side effects 2.  Hypertension.  On olmesartan-HCTZ 20-12.5 mg tablet once daily.  Tolerating well.  ROS: Per HPI  PMH: She reports that she has never smoked. She has never used smokeless tobacco. She reports that she does not drink alcohol or use drugs.  Objective:  Physical Exam: BP 122/80 (BP Location: Left Arm, Patient Position: Sitting, Cuff Size: Large)   Pulse 85   Temp 99.4 F (37.4 C) (Oral)   Ht 5\' 5"  (1.651 m)   Wt 248 lb (112.5 kg)   LMP 11/30/2018 Comment: Depo Provera  SpO2 98%   BMI 41.27 kg/m   Gen: NAD, resting comfortably CV: RRR with no murmurs appreciated Pulm: NWOB, CTAB with no crackles, wheezes, or rhonchi GI: Normal bowel sounds present. Soft, Nontender, Nondistended.  Assessment/Plan:  DM (diabetes mellitus), type 2 ([facility]) Too early to recheck A1c.  Continue metformin 750 mg daily.  Follow-up with me in 1 to 3 months to recheck A1c.  Essential hypertension At goal.  Continue Benicar 20-12.5 mg tablet once daily.  Dysfunctional uterine bleeding Depo-Provera given today.  Will be undergoing hysteroscopy and D&C in about 3 weeks via OBGYN.   Algis Greenhouse. [name], MD 12/03/2018 8:22 AM

## 2018-12-03 NOTE — Assessment & Plan Note (Signed)
At goal.  Continue Benicar 20-12.5 mg tablet once daily.

## 2018-12-03 NOTE — Addendum Note (Signed)
Addended by: Elmer Bales on: 12/03/2018 01:56 PM   Modules accepted: Orders

## 2018-12-03 NOTE — Assessment & Plan Note (Signed)
Too early to recheck A1c.  Continue metformin 750 mg daily.  Follow-up with me in 1 to 3 months to recheck A1c.

## 2018-12-03 NOTE — Patient Instructions (Signed)
It was very nice to see you today!  We will give you an injection of depo today.  Come back to see me in 3 months to recheck your blood sugar, or sooner as needed.  Take care, Dr [name]

## 2018-12-04 ENCOUNTER — Telehealth: Payer: Self-pay | Admitting: Obstetrics and Gynecology

## 2018-12-04 ENCOUNTER — Ambulatory Visit: Payer: 59 | Admitting: Family Medicine

## 2018-12-04 NOTE — Telephone Encounter (Signed)
Patient is ready to proceed with benefit information for surgery. Patient did provide new insurance information that will be effective January 2020.

## 2018-12-05 NOTE — Telephone Encounter (Signed)
Returned call to patient to discuss surgery and benefits for 2020 (see account notes). Left voicemail message requesting a return call

## 2018-12-06 NOTE — Telephone Encounter (Signed)
Patient returned call. Patient is considering surgery in January 2020. Advised patient we were unable to verify 2020 benefits with her [facility] plan, as it does not go into effect until 12/19/2018. Patient advises she will check with company to get more information regarding benefits, before confirming surgery. Patient advises she will return call to our office   cc: [place] Snowball, RN

## 2018-12-26 NOTE — Telephone Encounter (Signed)
Patient returned call.  Spoke with patient regarding benefit for surgery. Patient understood and agreeable. Patient aware this is professional benefit only. Patient provided the phone number to the [facility] to address questions regarding hospital benefits.  Patient advises she will call back when ready to proceed.    cc: [place] Snowball, RN

## 2018-12-26 NOTE — Telephone Encounter (Signed)
Call placed to convey 2020 benefits with new insurance plan and to follow up in regards to surgery planning. Left voicemail message requesting a return call    cc: [place] Snowball, RN

## 2018-12-27 ENCOUNTER — Ambulatory Visit: Payer: Self-pay | Admitting: Family Medicine

## 2019-01-01 ENCOUNTER — Ambulatory Visit: Payer: Self-pay | Admitting: Family Medicine

## 2019-01-01 DIAGNOSIS — Z0289 Encounter for other administrative examinations: Secondary | ICD-10-CM

## 2019-01-09 ENCOUNTER — Encounter: Payer: Self-pay | Admitting: Family Medicine

## 2019-01-31 NOTE — Telephone Encounter (Signed)
Follow-up call to patient regarding plans for surgery. States she changed job and waiting for new insurance to be effective. Will be ready to proceed in May.  States bleeding is controlled with Depo (getting at PCP). Has cycle when Depo due but stops once she gets injection.  States she will call back when ready to proceed. Advised will forward to Dr Talbert Nan for final review.  Please advise.

## 2019-02-04 ENCOUNTER — Telehealth: Payer: Self-pay | Admitting: Obstetrics and Gynecology

## 2019-02-04 NOTE — Telephone Encounter (Signed)
Call placed to patient to follow up regarding surgery plan. Left voicemail message requesting a return call   [place] Snowball, RN

## 2019-02-05 ENCOUNTER — Ambulatory Visit (INDEPENDENT_AMBULATORY_CARE_PROVIDER_SITE_OTHER): Payer: BLUE CROSS/BLUE SHIELD | Admitting: Physician Assistant

## 2019-02-05 DIAGNOSIS — M79642 Pain in left hand: Secondary | ICD-10-CM

## 2019-02-05 NOTE — Progress Notes (Signed)
Patient presented to office for a second opinion of L hand sprain that occurred at work.  She is already seeing a Workers' Engineer, site.  Patient was told that we are unable to evaluate this since she is already seeing a Workers' Engineer, site.  Appointment was cancelled and patient was not seen.

## 2019-02-06 ENCOUNTER — Encounter: Payer: Self-pay | Admitting: Physician Assistant

## 2019-02-20 ENCOUNTER — Ambulatory Visit: Payer: BLUE CROSS/BLUE SHIELD | Admitting: Sports Medicine

## 2019-02-21 ENCOUNTER — Ambulatory Visit: Payer: Self-pay | Admitting: Family Medicine

## 2019-02-21 ENCOUNTER — Ambulatory Visit (INDEPENDENT_AMBULATORY_CARE_PROVIDER_SITE_OTHER): Payer: BLUE CROSS/BLUE SHIELD

## 2019-02-21 DIAGNOSIS — N938 Other specified abnormal uterine and vaginal bleeding: Secondary | ICD-10-CM

## 2019-02-21 MED ORDER — MEDROXYPROGESTERONE ACETATE 150 MG/ML IM SUSP
150.0000 mg | Freq: Once | INTRAMUSCULAR | Status: AC
  Administered 2019-02-21: 150 mg via INTRAMUSCULAR

## 2019-02-21 NOTE — Progress Notes (Signed)
Per orders of Dr. Yong Channel  injection of Depo-Provera given by Loralyn Freshwater.Given in right deltoid.Patient tolerated injection well.

## 2019-02-28 ENCOUNTER — Ambulatory Visit: Payer: Self-pay | Admitting: Family Medicine

## 2019-03-04 ENCOUNTER — Encounter: Payer: Self-pay | Admitting: Family Medicine

## 2019-05-09 ENCOUNTER — Other Ambulatory Visit: Payer: Self-pay

## 2019-05-09 ENCOUNTER — Telehealth: Payer: Self-pay | Admitting: Family Medicine

## 2019-05-09 MED ORDER — FLUTICASONE PROPIONATE 50 MCG/ACT NA SUSP: 2.0000 | Freq: Every day | NASAL | 6 refills | Status: DC

## 2019-05-09 NOTE — Telephone Encounter (Signed)
See note

## 2019-05-09 NOTE — Telephone Encounter (Signed)
Rx sent to pharmacy   

## 2019-05-09 NOTE — Telephone Encounter (Signed)
Copied from [place] [id number]. Topic: Quick Communication - Rx Refill/Question >> May 09, 2019  1:43 PM Celene Kras A wrote: Medication: fluticasone (FLONASE) 50 MCG/ACT nasal spray [[id number]]   Has the patient contacted their pharmacy? No. (Agent: If no, request that the patient contact the pharmacy for the refill.) (Agent: If yes, when and what did the pharmacy advise?)  Preferred Pharmacy (with phone number or street name): [facility] [address] [place] [address] Phone: [phone] Fax: [phone] Not a 24 hour pharmacy; exact hours not known.    Agent: Please be advised that RX refills may take up to 3 business days. We ask that you follow-up with your pharmacy.

## 2019-05-15 ENCOUNTER — Other Ambulatory Visit: Payer: Self-pay | Admitting: Family Medicine

## 2019-05-27 ENCOUNTER — Ambulatory Visit (INDEPENDENT_AMBULATORY_CARE_PROVIDER_SITE_OTHER): Payer: BLUE CROSS/BLUE SHIELD | Admitting: Family Medicine

## 2019-05-27 DIAGNOSIS — N938 Other specified abnormal uterine and vaginal bleeding: Secondary | ICD-10-CM

## 2019-05-27 MED ORDER — MEDROXYPROGESTERONE ACETATE 150 MG/ML IM SUSP
150.0000 mg | Freq: Once | INTRAMUSCULAR | Status: AC
  Administered 2019-05-27: 150 mg via INTRAMUSCULAR

## 2019-05-27 NOTE — Progress Notes (Signed)
Per orders of Dr. Pura Spice of Medroxyprogesterone given by Loralyn Freshwater.Given in left deltoid.Patient tolerated injection well.

## 2019-05-30 NOTE — Telephone Encounter (Signed)
Call to patient. Per ROI, can leave message on voice mail. Left message calling to check in and see how she is doing and check on plans for scheduling procedure following Covid 19. Requested call back with update.

## 2019-06-03 NOTE — Telephone Encounter (Signed)
2 attempts to contact patient by phone- message states " call did not go through."   My Chart message sent.

## 2019-06-04 NOTE — Telephone Encounter (Signed)
No response to multiple calls or My Chart message sent on 06-03-19.  Please advise on next step.

## 2019-06-05 ENCOUNTER — Other Ambulatory Visit: Payer: Self-pay | Admitting: Family Medicine

## 2019-06-05 NOTE — Telephone Encounter (Signed)
Please send her a letter in the mail. Make sure she understands that there is a small chance of having cancer or a precancerous change in an endometrial polyp and I strongly recommend she be further evaluated.

## 2019-06-06 ENCOUNTER — Telehealth: Payer: Self-pay | Admitting: Family Medicine

## 2019-06-06 NOTE — Telephone Encounter (Signed)
Holly with [employer] calling and states that the metFORMIN (GLUCOPHAGE-XR) 750 MG 24 hr tablet Is on recall. Would like to know if there is another medication that Dr [name] would like to prescribe in place? Please advise.  CB#: [id number] - [place], [place]

## 2019-06-07 ENCOUNTER — Other Ambulatory Visit: Payer: Self-pay

## 2019-06-07 MED ORDER — METFORMIN HCL 500 MG PO TABS: 500.0000 mg | ORAL_TABLET | Freq: Every day | ORAL | 3 refills | Status: DC

## 2019-06-07 NOTE — Telephone Encounter (Signed)
Rx has been sent to pharmacy

## 2019-06-20 ENCOUNTER — Encounter: Payer: Self-pay | Admitting: Family Medicine

## 2019-06-20 ENCOUNTER — Ambulatory Visit: Payer: Self-pay | Admitting: Family Medicine

## 2019-06-20 DIAGNOSIS — Z0289 Encounter for other administrative examinations: Secondary | ICD-10-CM

## 2019-07-10 NOTE — Telephone Encounter (Signed)
Please let the patient know that the abnormalities seen on her last ultrasound (over a year ago) could be precancerous. I would recommend a repeat ultrasound and possibly endometrial biopsy (if she is going to continue to delay surgery). It is also possible for polyps to resolve in young women.  I would recommend ultrasound with repeat biopsy if her lining is still thickened.

## 2019-07-10 NOTE — Telephone Encounter (Signed)
Okay to close this encounter? °

## 2019-07-30 NOTE — Telephone Encounter (Signed)
See next encounter.

## 2019-07-31 ENCOUNTER — Other Ambulatory Visit: Payer: Self-pay

## 2019-07-31 DIAGNOSIS — Z20822 Contact with and (suspected) exposure to covid-19: Secondary | ICD-10-CM

## 2019-08-01 LAB — NOVEL CORONAVIRUS, NAA: SARS-CoV-2, NAA: NOT DETECTED

## 2019-08-16 ENCOUNTER — Telehealth: Payer: Self-pay | Admitting: Physical Therapy

## 2019-08-16 NOTE — Telephone Encounter (Signed)
Copied from [place] [id number]. Topic: General - Other >> Aug 16, 2019 12:12 PM Celene Kras A wrote: Reason for CRM: Pt called and is requesting to have a note written for her trainer stating that it is okay for her to exercise since she does have high BP. Please advise.

## 2019-08-16 NOTE — Telephone Encounter (Signed)
Patient needs an office visit.  

## 2019-08-19 NOTE — Telephone Encounter (Signed)
Spoke with patient she will schedule appt

## 2019-08-21 ENCOUNTER — Ambulatory Visit (INDEPENDENT_AMBULATORY_CARE_PROVIDER_SITE_OTHER): Payer: Self-pay

## 2019-08-21 ENCOUNTER — Other Ambulatory Visit: Payer: Self-pay

## 2019-08-21 DIAGNOSIS — N938 Other specified abnormal uterine and vaginal bleeding: Secondary | ICD-10-CM

## 2019-08-21 MED ORDER — MEDROXYPROGESTERONE ACETATE 150 MG/ML IM SUSP
150.0000 mg | Freq: Once | INTRAMUSCULAR | Status: AC
  Administered 2019-08-21: 09:00:00 150 mg via INTRAMUSCULAR

## 2019-08-21 NOTE — Progress Notes (Signed)
Per orders of Dr. [name], injection of Depo Provera 150 mg/mL given in right deltoid by Gertie Exon, CMA.  Patient tolerated injection well.

## 2019-08-22 ENCOUNTER — Ambulatory Visit (INDEPENDENT_AMBULATORY_CARE_PROVIDER_SITE_OTHER): Payer: Self-pay | Admitting: Family Medicine

## 2019-08-22 ENCOUNTER — Encounter: Payer: Self-pay | Admitting: Family Medicine

## 2019-08-22 VITALS — BP 136/80 | HR 84 | Temp 97.1°F | Ht 65.0 in | Wt 265.2 lb

## 2019-08-22 DIAGNOSIS — L03032 Cellulitis of left toe: Secondary | ICD-10-CM

## 2019-08-22 DIAGNOSIS — Z6841 Body Mass Index (BMI) 40.0 and over, adult: Secondary | ICD-10-CM

## 2019-08-22 DIAGNOSIS — E119 Type 2 diabetes mellitus without complications: Secondary | ICD-10-CM

## 2019-08-22 DIAGNOSIS — I10 Essential (primary) hypertension: Secondary | ICD-10-CM

## 2019-08-22 MED ORDER — DOXYCYCLINE HYCLATE 100 MG PO TABS: 100.0000 mg | ORAL_TABLET | Freq: Two times a day (BID) | ORAL | 0 refills | Status: DC

## 2019-08-22 MED ORDER — BACITRACIN 500 UNIT/GM EX OINT: 1.0000 "application " | TOPICAL_OINTMENT | Freq: Two times a day (BID) | CUTANEOUS | 0 refills | Status: DC

## 2019-08-22 NOTE — Patient Instructions (Signed)
It was very nice to see you today!  Please soak your foot in Epson salt twice per day.  Please use the bacitracin twice per day.  If your symptoms do not improve, please try the doxycycline.  It is okay for you to exercise.  I would like for you to do this to help lower your blood pressure.  Come back to see me in 3 to 6 months.  We can recheck blood sugar level at that time.  Take care, Dr [name]  Please try these tips to maintain a healthy lifestyle:   Eat at least 3 REAL meals and 1-2 snacks per day.  Aim for no more than 5 hours between eating.  If you eat breakfast, please do so within one hour of getting up.    Obtain twice as many fruits/vegetables as protein or carbohydrate foods for both lunch and dinner. (Half of each meal should be fruits/vegetables, one quarter protein, and one quarter starchy carbs)   Cut down on sweet beverages. This includes juice, soda, and sweet tea.    Exercise at least 150 minutes every week.

## 2019-08-22 NOTE — Assessment & Plan Note (Signed)
At goal.  Continue olmesartan-HCTZ 20- 12.5 once daily.

## 2019-08-22 NOTE — Progress Notes (Signed)
   Chief Complaint:  Peggy Schmidt is a 38 y.o. female who presents today with a chief complaint of Toe Pain.   Assessment/Plan:  Paronychia No red flags.  Recommended Epson salt soaks and topical bacitracin.  If no improvement in a few days we will start doxycycline.  No areas amenable to I&D today.  Discussed reasons to return to care.  Follow-up as needed.  DM (diabetes mellitus), type 2 ([facility]) Continue metformin 500 mg daily.  Check A1c today as patient does not have insurance.  She will follow-up me in a few months when she regains insurance.  Check A1c at that time.  Essential hypertension At goal.  Continue olmesartan-HCTZ 20- 12.5 once daily.  Body mass index is 44.13 kg/m. / Obesity BMI Metric Follow Up - 08/22/19 0819      BMI Metric Follow Up-Please document annually   BMI Metric Follow Up  Education provided          Subjective:  HPI:  Toe Pain Started a few days ago.  Located in left great toe.  Tried Epson salt soaks with no improvement.  No drainage.  No obvious precipitating factors.  No other obvious alleviating or aggravating factors.  Symptoms seem to be worsening.  # Essential Hypertension - On olmesartan-HCTZ 20-12.5 once daily and doing well - ROS: No reported chest pain or shortness of breath  #Type 2 diabetes - On metformin 500 mg daily.  Tolerating well - No reported polyuria or polydipsia  ROS: Per HPI  PMH: She reports that she has never smoked. She has never used smokeless tobacco. She reports that she does not drink alcohol or use drugs.      Objective:  Physical Exam: BP 136/80   Pulse 84   Temp (!) 97.1 F (36.2 C)   Ht 5\' 5"  (1.651 m)   Wt 265 lb 3.2 oz (120.3 kg)   SpO2 96%   BMI 44.13 kg/m   Wt Readings from Last 3 Encounters:  08/22/19 265 lb 3.2 oz (120.3 kg)  12/03/18 248 lb (112.5 kg)  11/09/18 248 lb 12.8 oz (112.9 kg)  Gen: NAD, resting comfortably CV: Regular rate and rhythm with no murmurs appreciated Pulm: Normal  work of breathing, clear to auscultation bilaterally with no crackles, wheezes, or rhonchi MSK: No edema, cyanosis, or clubbing noted.  Left great toe with inflamed and edematous lateral nail fold.  Very tender to palpation. Skin: Warm, dry Neuro: Grossly normal, moves all extremities Psych: Normal affect and thought content      Caleb M. [name], MD 08/22/2019 8:23 AM

## 2019-08-22 NOTE — Assessment & Plan Note (Signed)
Continue metformin 500 mg daily.  Check A1c today as patient does not have insurance.  She will follow-up me in a few months when she regains insurance.  Check A1c at that time.

## 2019-08-27 ENCOUNTER — Encounter: Payer: Self-pay | Admitting: Family Medicine

## 2019-08-28 ENCOUNTER — Telehealth: Payer: Self-pay | Admitting: Obstetrics and Gynecology

## 2019-08-28 ENCOUNTER — Encounter: Payer: Self-pay | Admitting: Family Medicine

## 2019-08-28 ENCOUNTER — Other Ambulatory Visit: Payer: Self-pay

## 2019-08-28 ENCOUNTER — Ambulatory Visit (INDEPENDENT_AMBULATORY_CARE_PROVIDER_SITE_OTHER): Payer: Self-pay | Admitting: Family Medicine

## 2019-08-28 ENCOUNTER — Telehealth: Payer: Self-pay | Admitting: *Deleted

## 2019-08-28 VITALS — BP 130/80 | HR 80 | Temp 97.9°F | Ht 65.0 in | Wt 262.5 lb

## 2019-08-28 DIAGNOSIS — N921 Excessive and frequent menstruation with irregular cycle: Secondary | ICD-10-CM

## 2019-08-28 DIAGNOSIS — Z86018 Personal history of other benign neoplasm: Secondary | ICD-10-CM

## 2019-08-28 DIAGNOSIS — L03032 Cellulitis of left toe: Secondary | ICD-10-CM

## 2019-08-28 NOTE — Telephone Encounter (Signed)
See next encounter.

## 2019-08-28 NOTE — Telephone Encounter (Signed)
Left voicemail regarding referral appointment. The information is listed below. Should the patient need to cancel or reschedule this appointment, Please advise them to call the office they've been referred to in order to reschedule.  [facility] [address], [place], [place] [address] [phone]  Dr. Laray Anger A. Anyanwu 09/27/19 @ 10:35 am. Please arrive 15 minutes early and bring your insurance card and photo id and list of medications

## 2019-08-28 NOTE — Telephone Encounter (Signed)
Patient returned my call and I conveyed the appointment details. For her scheduled appointment 09/27/19.

## 2019-08-28 NOTE — Telephone Encounter (Signed)
Call to patient to follow-up on plan for surgery.  See previous phone encounter and following message from Dr Talbert Nan.  Please send her a letter in the mail. Make sure she understands that there is a small chance of having cancer or a precancerous change in an endometrial polyp and I strongly recommend she be further evaluated.   Today, patient reports she is still getting Depo at PCP and initially stated no bleeding. Then reports occasional day of light bleeding that only requires a regular tampon.  Reviewed message from Dr Talbert Nan above. Patient states she does want to have surgery but is currently unemployed and without insurance. Offered referral to GYN clinic and patient agreeable. Advised she still can return here when desires, just want to assist her with proceeding with care without undue expense.   Routing to Dr Talbert Nan. Encounter closed.

## 2019-08-28 NOTE — Patient Instructions (Signed)
It was very nice to see you today!  Please soak your foot twice daily with Epsom salts. Use the ointment twice per day.  Let me know if not improving over the next few days.   Take care, Dr [name]  Please try these tips to maintain a healthy lifestyle:   Eat at least 3 REAL meals and 1-2 snacks per day.  Aim for no more than 5 hours between eating.  If you eat breakfast, please do so within one hour of getting up.    Obtain twice as many fruits/vegetables as protein or carbohydrate foods for both lunch and dinner. (Half of each meal should be fruits/vegetables, one quarter protein, and one quarter starchy carbs)   Cut down on sweet beverages. This includes juice, soda, and sweet tea.    Exercise at least 150 minutes every week.    Paronychia Paronychia is an infection of the skin. It happens near a fingernail or toenail. It may cause pain and swelling around the nail. In some cases, a fluid-filled bump (abscess) can form near or under the nail. Usually, this condition is not serious, and it clears up with treatment. Follow these instructions at home: Wound care  Keep the affected area clean.  Soak the fingers or toes in warm water as told by your doctor. You may be told to do this for 20 minutes, 2-3 times a day.  Keep the area dry when you are not soaking it.  Do not try to drain a fluid-filled bump on your own.  Follow instructions from your doctor about how to take care of the affected area. Make sure you: ? Wash your hands with soap and water before you change your bandage (dressing). If you cannot use soap and water, use hand sanitizer. ? Change your bandage as told by your doctor.  If you had a fluid-filled bump and your doctor drained it, check the area every day for signs of infection. Check for: ? Redness, swelling, or pain. ? Fluid or blood. ? Warmth. ? Pus or a bad smell. Medicines   Take over-the-counter and prescription medicines only as told by your  doctor.  If you were prescribed an antibiotic medicine, take it as told by your doctor. Do not stop taking it even if you start to feel better. General instructions  Avoid touching any chemicals.  Do not pick at the affected area. Prevention  To prevent this condition from happening again: ? Wear rubber gloves when putting your hands in water for washing dishes or other tasks. ? Wear gloves if your hands might touch cleaners or chemicals. ? Avoid injuring your nails or fingertips. ? Do not bite your nails or tear hangnails. ? Do not cut your nails very short. ? Do not cut the skin at the base and sides of the nail (cuticles). ? Use clean nail clippers or scissors when trimming nails. Contact a doctor if:  You feel worse.  You do not get better.  You have more fluid, blood, or pus coming from the affected area.  Your finger or knuckle is swollen or is hard to move. Get help right away if you have:  A fever or chills.  Redness spreading from the affected area.  Pain in a joint or muscle. Summary  Paronychia is an infection of the skin. It happens near a fingernail or toenail.  This condition may cause pain and swelling around the nail.  Soak the fingers or toes in warm water as told by  your doctor.  Usually, this condition is not serious, and it clears up with treatment. This information is not intended to replace advice given to you by your health care provider. Make sure you discuss any questions you have with your health care provider. Document Released: 11/23/2009 Document Revised: 12/22/2017 Document Reviewed: 12/18/2017 Elsevier Patient Education  2020 [employer].

## 2019-08-28 NOTE — Progress Notes (Signed)
   Chief Complaint:  KHRISTIAN ARTIGA is a 38 y.o. female who presents today with a chief complaint of paronychia follow up.   Assessment/Plan:  Paronychia I&D performed today.  See below procedure note.  Tolerated well.  Recommended Epson salt soaks and topical bacitracin for the next several days.  She can use over-the-counter analgesics as needed.  Discussed reasons to return to care.  Follow-up as needed.    Subjective:  HPI:  Paronychia Patient seen 6 days ago and was started on topical bacitracin and epsom salt soaks. Unfortunately symptoms have worsened and she is having worsening pain to the area.   ROS: Per HPI  PMH: She reports that she has never smoked. She has never used smokeless tobacco. She reports that she does not drink alcohol or use drugs.      Objective:  Physical Exam: There were no vitals taken for this visit.  Gen: NAD, resting comfortably MSK paronychia noted at the lateral fold of left great toenail.  Incision and Drainage Procedure Note  Pre-operative Diagnosis: Paronychia  Post-operative Diagnosis: same  Indications: Therapeutic  Anesthesia: ethyl chloride spray  Procedure Details  The procedure, risks and complications have been discussed in detail (including, but not limited infection, bleeding) with the patient, and the patient has given verbal consent  The skin was sterilely prepped and draped over the affected area in the usual fashion. After adequate local anesthesia with topical ethyl chloride, I&D with a #11 blade was performed on the left great toe. Purulent drainage: present The patient was observed until stable.  Findings: Paronychia  EBL: 1 cc's  Drains: None  Condition: Tolerated procedure well   Complications: none.         Algis Greenhouse. [name], MD 08/28/2019 10:26 AM

## 2019-09-27 ENCOUNTER — Encounter: Payer: Self-pay | Admitting: Obstetrics & Gynecology

## 2019-09-27 ENCOUNTER — Ambulatory Visit (INDEPENDENT_AMBULATORY_CARE_PROVIDER_SITE_OTHER): Payer: Self-pay | Admitting: Obstetrics & Gynecology

## 2019-09-27 ENCOUNTER — Other Ambulatory Visit: Payer: Self-pay

## 2019-09-27 VITALS — BP 119/87 | HR 90 | Wt 266.4 lb

## 2019-09-27 DIAGNOSIS — N898 Other specified noninflammatory disorders of vagina: Secondary | ICD-10-CM

## 2019-09-27 DIAGNOSIS — N760 Acute vaginitis: Secondary | ICD-10-CM

## 2019-09-27 DIAGNOSIS — Z113 Encounter for screening for infections with a predominantly sexual mode of transmission: Secondary | ICD-10-CM

## 2019-09-27 DIAGNOSIS — N939 Abnormal uterine and vaginal bleeding, unspecified: Secondary | ICD-10-CM

## 2019-09-27 DIAGNOSIS — D259 Leiomyoma of uterus, unspecified: Secondary | ICD-10-CM

## 2019-09-27 DIAGNOSIS — B9689 Other specified bacterial agents as the cause of diseases classified elsewhere: Secondary | ICD-10-CM

## 2019-09-27 DIAGNOSIS — N921 Excessive and frequent menstruation with irregular cycle: Secondary | ICD-10-CM

## 2019-09-27 NOTE — Progress Notes (Signed)
   GYNECOLOGY OFFICE VISIT NOTE  History:   Peggy Schmidt is a 38 y.o. G0 here today for discussion about breakthrough bleeding on Depo Provera/AUB.  Long history of AUB, last ultrasound in 2018 showed thickened endometrium (like the one in 2017). The last ultrasound was done in 2018 was done at [facility], followed by an endometrial biopsy that was benign. She was started on Depo Provera.  Since then, she has episodes of breakthrough bleeding that are light but happen sometimes 2-3 times a week. No symptoms of anemia. She denies any abnormal vaginal discharge, pelvic pain or other concerns.    Past Medical History:  Diagnosis Date  . Abnormal uterine bleeding   . Anemia   . Asthma   . DM (diabetes mellitus), type 2 ([place])   . Fibroid   . GERD (gastroesophageal reflux disease)   . Hay fever   . Hypertension    pt states her blood pressure has been up and down for the last year  . Post concussion syndrome 2017    Past Surgical History:  Procedure Laterality Date  . ANKLE FRACTURE SURGERY      The following portions of the patient's history were reviewed and updated as appropriate: allergies, current medications, past family history, past medical history, past social history, past surgical history and problem list.   Health Maintenance:  Normal pap in 2018 as per patient.    Review of Systems:  Pertinent items noted in HPI and remainder of comprehensive ROS otherwise negative.  Physical Exam:  BP 119/87   Pulse 90   Wt 266 lb 6.4 oz (120.8 kg)   LMP  (LMP Unknown)   BMI 44.33 kg/m  CONSTITUTIONAL: Well-developed, well-nourished female in no acute distress.  HEENT:  Normocephalic, atraumatic. External right and left ear normal. No scleral icterus.  NECK: Normal range of motion, supple, no masses noted on observation SKIN: No rash noted. Not diaphoretic. No erythema. No pallor. MUSCULOSKELETAL: Normal range of motion. No edema noted. NEUROLOGIC: Alert and oriented to  person, place, and time. Normal muscle tone coordination. No cranial nerve deficit noted. PSYCHIATRIC: Normal mood and affect. Normal behavior. Normal judgment and thought content. CARDIOVASCULAR: Normal heart rate noted RESPIRATORY: Effort and breath sounds normal, no problems with respiration noted ABDOMEN: Obese. No masses noted. No other overt distention noted.   PELVIC: Normal appearing external genitalia; normal appearing vaginal mucosa and cervix.  No abnormal discharge noted.  Not able to palpate uterus or adnexa due to habitus.     Assessment and Plan:    1. Abnormal uterine bleeding (AUB) 2. Breakthrough bleeding on Depo-Provera 3. Uterine leiomyoma, unspecified location Infection screen done, she was given information to get free pap smear (declined getting this here due to cost). Unable to palpate uterus on exam, will obtain pelvic ultrasound.   - US PELVIC COMPLETE WITH TRANSVAGINAL; Future - Cervicovaginal ancillary only Will follow up all results and manage accordingly. Patient is interested in surgical intervention, she was told this will be discussed later after the results are obtained.  Routine preventative health maintenance measures emphasized. Please refer to After Visit Summary for other counseling recommendations.   Return in about 1 month (around 10/28/2019) for Followup.    Total face-to-face time with patient: 20 minutes.  Over 50% of encounter was spent on counseling and coordination of care.   Verita Schneiders, MD, [place] [facility], [place]

## 2019-10-02 ENCOUNTER — Other Ambulatory Visit: Payer: Self-pay | Admitting: Family Medicine

## 2019-10-03 ENCOUNTER — Telehealth: Payer: Self-pay | Admitting: Family Medicine

## 2019-10-03 NOTE — Telephone Encounter (Signed)
Patient would like a call back with the dosage that is used for her depo injections.

## 2019-10-03 NOTE — Telephone Encounter (Signed)
See note

## 2019-10-03 NOTE — Telephone Encounter (Signed)
Patient notified of dosage,she voices understanding.

## 2019-10-07 LAB — CERVICOVAGINAL ANCILLARY ONLY
Bacterial Vaginitis (gardnerella): POSITIVE — AB
Candida Glabrata: NEGATIVE
Candida Vaginitis: NEGATIVE
Chlamydia: NEGATIVE
Comment: NEGATIVE
Comment: NEGATIVE
Comment: NEGATIVE
Comment: NEGATIVE
Comment: NEGATIVE
Comment: NORMAL
Neisseria Gonorrhea: NEGATIVE
Trichomonas: NEGATIVE

## 2019-10-07 MED ORDER — METRONIDAZOLE 500 MG PO TABS: 500.0000 mg | ORAL_TABLET | Freq: Two times a day (BID) | ORAL | 1 refills | Status: DC

## 2019-10-07 NOTE — Addendum Note (Signed)
Addended by: Verita Schneiders A on: 10/07/2019 01:07 PM   Modules accepted: Orders

## 2019-10-10 ENCOUNTER — Ambulatory Visit (HOSPITAL_COMMUNITY)
Admission: RE | Admit: 2019-10-10 | Discharge: 2019-10-10 | Disposition: A | Discharge status: Home / Self Care | Payer: Self-pay | Source: Ambulatory Visit | Attending: Obstetrics & Gynecology | Admitting: Obstetrics & Gynecology

## 2019-10-10 ENCOUNTER — Other Ambulatory Visit: Payer: Self-pay

## 2019-10-10 DIAGNOSIS — N939 Abnormal uterine and vaginal bleeding, unspecified: Secondary | ICD-10-CM | POA: Insufficient documentation

## 2019-10-16 ENCOUNTER — Encounter: Payer: Self-pay | Admitting: Family Medicine

## 2019-10-16 ENCOUNTER — Ambulatory Visit (INDEPENDENT_AMBULATORY_CARE_PROVIDER_SITE_OTHER): Payer: Self-pay | Admitting: Family Medicine

## 2019-10-16 ENCOUNTER — Other Ambulatory Visit: Payer: Self-pay

## 2019-10-16 VITALS — BP 134/84 | HR 87 | Temp 98.0°F | Ht 65.0 in | Wt 263.0 lb

## 2019-10-16 DIAGNOSIS — I10 Essential (primary) hypertension: Secondary | ICD-10-CM

## 2019-10-16 DIAGNOSIS — M25562 Pain in left knee: Secondary | ICD-10-CM

## 2019-10-16 MED ORDER — KETOROLAC TROMETHAMINE 60 MG/2ML IM SOLN
60.0000 mg | Freq: Once | INTRAMUSCULAR | Status: AC
  Administered 2019-10-16: 60 mg via INTRAMUSCULAR

## 2019-10-16 MED ORDER — KETOROLAC TROMETHAMINE 10 MG PO TABS: 10.0000 mg | ORAL_TABLET | Freq: Four times a day (QID) | ORAL | 0 refills | Status: AC | PRN

## 2019-10-16 NOTE — Assessment & Plan Note (Signed)
At goal.  Continue olmesartan-HCTZ 20-12.5 once daily.  Do not anticipate toradol to cause significant increase in BP.

## 2019-10-16 NOTE — Patient Instructions (Signed)
It was very nice to see you today!  We will give you an injection of Toradol today.  Tomorrow please start the oral Toradol.  Please work on the exercises.  Please use the compression sleeve as much as you can.  If your symptoms are not improving by next week, please let me know.  Take care, Dr [name]  Please try these tips to maintain a healthy lifestyle:   Eat at least 3 REAL meals and 1-2 snacks per day.  Aim for no more than 5 hours between eating.  If you eat breakfast, please do so within one hour of getting up.    Obtain twice as many fruits/vegetables as protein or carbohydrate foods for both lunch and dinner. (Half of each meal should be fruits/vegetables, one quarter protein, and one quarter starchy carbs)   Cut down on sweet beverages. This includes juice, soda, and sweet tea.    Exercise at least 150 minutes every week.

## 2019-10-16 NOTE — Progress Notes (Signed)
   Chief Complaint:  Peggy Schmidt is a 38 y.o. female who presents today with a chief complaint of left knee pain.   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  Likely has had a flare of her degenerative disease.  Offered intra-articular injection today however she declined.  Will give 60 mg of IM Toradol today and start oral Toradol tomorrow.  Discussed home exercises and handout was given.  Recommended ice and compression as well.  If symptoms are not improving within the next week or so would need referral to PT and/orthopedics.  Essential hypertension At goal.  Continue olmesartan-HCTZ 20-12.5 once daily.  Do not anticipate toradol to cause significant increase in BP.     Subjective:  HPI:  Left Knee Pain  Pain started a couple weeks ago.  She works as a Education officer, community for [employer] and states that she had to run from a dog.  She thinks that she tweaked it during this time.  She has had to do a lot of getting in and out of her truck which is aggravate her symptoms.  Mostly located on the inner part of her left knee.  Occasionally has some cracking and popping as well.  She has tried ibuprofen which is helped.  No other treatments tried.  No other obvious aggravating or alleviating factors.  ROS: Per HPI  PMH: She reports that she has never smoked. She has never used smokeless tobacco. She reports that she does not drink alcohol or use drugs.      Objective:  Physical Exam: LMP  (LMP Unknown)   Gen: NAD, resting comfortably MSK: -Left knee: No deformities.  Tender to palpation along bilateral joint line.  Mild crepitus with active range of motion.  Stable to varus and valgus stress.  Anterior and posterior drawer signs negative.  Neurovascular intact distally.     Algis Greenhouse. [name], MD 10/16/2019 9:01 AM

## 2019-10-25 ENCOUNTER — Encounter: Payer: Self-pay | Admitting: Obstetrics & Gynecology

## 2019-10-25 ENCOUNTER — Telehealth (INDEPENDENT_AMBULATORY_CARE_PROVIDER_SITE_OTHER): Payer: Self-pay | Admitting: Obstetrics & Gynecology

## 2019-10-25 ENCOUNTER — Other Ambulatory Visit: Payer: Self-pay

## 2019-10-25 DIAGNOSIS — N939 Abnormal uterine and vaginal bleeding, unspecified: Secondary | ICD-10-CM

## 2019-10-25 DIAGNOSIS — Z86018 Personal history of other benign neoplasm: Secondary | ICD-10-CM

## 2019-10-25 MED ORDER — MEDROXYPROGESTERONE ACETATE 150 MG/ML IM SUSP
150.0000 mg | INTRAMUSCULAR | Status: DC
  Administered 2019-11-06 – 2020-02-26 (×3): 150 mg via INTRAMUSCULAR

## 2019-10-25 NOTE — Progress Notes (Signed)
TELEHEALTH GYNECOLOGY VIRTUAL VIDEO VISIT ENCOUNTER NOTE  Provider location: [facility] at [place]   I connected with [name] on 10/25/19 at  8:35 AM EST by Doximity Video Encounter at home and verified that I am speaking with the correct person using two identifiers.   I discussed the limitations, risks, security and privacy concerns of performing an evaluation and management service virtually and the availability of in person appointments. I also discussed with the patient that there may be a patient responsible charge related to this service. The patient expressed understanding and agreed to proceed.   History:  Peggy Schmidt is a 38 y.o. [place] female being followed up today for AUB.  Reports having some breakthrough bleeding after the last Depo Provera.  Wants to discuss ultrasound results. She denies any abnormal vaginal discharge, pelvic pain or other concerns.     Past Medical History:  Diagnosis Date  . Abnormal uterine bleeding   . Anemia   . Asthma   . DM (diabetes mellitus), type 2 ([place])   . Fibroid   . GERD (gastroesophageal reflux disease)   . Hay fever   . Hypertension    pt states her blood pressure has been up and down for the last year  . Post concussion syndrome 2017   Past Surgical History:  Procedure Laterality Date  . ANKLE FRACTURE SURGERY     The following portions of the patient's history were reviewed and updated as appropriate: allergies, current medications, past family history, past medical history, past social history, past surgical history and problem list.   Health Maintenance:  Normal pap in 2018 as per patient.    Review of Systems:  Pertinent items noted in HPI and remainder of comprehensive ROS otherwise negative.  Physical Exam:   General:  Alert, oriented and cooperative. Patient appears to be in no acute distress.  Mental Status: Normal mood and affect. Normal behavior. Normal judgment and thought content.    Respiratory: Normal respiratory effort, no problems with respiration noted  Rest of physical exam deferred due to type of encounter  Labs and Imaging US Pelvic Complete With Transvaginal  Result Date: 10/10/2019 CLINICAL DATA:  Patient with abnormal uterine bleeding. EXAM: TRANSABDOMINAL AND TRANSVAGINAL ULTRASOUND OF PELVIS TECHNIQUE: Both transabdominal and transvaginal ultrasound examinations of the pelvis were performed. Transabdominal technique was performed for global imaging of the pelvis including uterus, ovaries, adnexal regions, and pelvic cul-de-sac. It was necessary to proceed with endovaginal exam following the transabdominal exam to visualize the endometrium. COMPARISON:  None FINDINGS: Uterus Measurements: 11.8 x 4.8 x 6.6 cm = volume: 195.0 mL. No fibroids or other mass visualized. Suggestion of mild thickening involving the area of the cervix on transabdominal imaging. Endometrium Thickness: 9 mm.  No focal abnormality visualized. Right ovary Measurements: 4.0 x 2.5 x 2.6 cm = volume: 13.3 mL. Normal appearance/no adnexal mass. Left ovary Measurements: 4.5 x 2.2 x 2.4 cm = volume: 12.8 mL. Normal appearance/no adnexal mass. Other findings No abnormal free fluid. IMPRESSION: 1. Endometrium measures 9 mm. If bleeding remains unresponsive to hormonal or medical therapy, sonohysterogram should be considered for focal lesion work-up. (Ref: Radiological Reasoning: Algorithmic Workup of Abnormal Vaginal Bleeding with Endovaginal Sonography and Sonohysterography. AJR 2008; [id number]) 2. Suggestion of mild thickening at the level of the cervix on transabdominal imaging which does not persist on endovaginal imaging. Recommend direct visualization of the cervix. Electronically Signed   By: Lovey Newcomer M.D.   On: 10/10/2019  10:11      Assessment and Plan:     1. Abnormal uterine bleeding (AUB) 2. History of uterine fibroid Reviewed ultrasound findings with patient.  2017 ultrasound showed a 6 cm  fibroid, this was not seen here.  Patient reports it was seen in a 2019 scan.  Will repeat scan in a few months from now, but will get 2019 report.  For her bleeding, will shorten Depo Provera interval to 8 weeks, main order changed. She is due to come in on 11/06/19 for her next injection.  Bleeding precautions reviewed. - medroxyPROGESTERone (DEPO-PROVERA) injection 150 mg every 8 weeks.      I discussed the assessment and treatment plan with the patient. The patient was provided an opportunity to ask questions and all were answered. The patient agreed with the plan and demonstrated an understanding of the instructions.   The patient was advised to call back or seek an in-person evaluation/go to the ED if the symptoms worsen or if the condition fails to improve as anticipated.  I provided 15 minutes of face-to-face time during this encounter.   Verita Schneiders, MD [facility], [place]

## 2019-10-25 NOTE — Patient Instructions (Signed)
Return to clinic for any scheduled appointments or for any gynecologic concerns as needed.   

## 2019-10-28 ENCOUNTER — Ambulatory Visit: Payer: Self-pay

## 2019-11-06 ENCOUNTER — Ambulatory Visit (INDEPENDENT_AMBULATORY_CARE_PROVIDER_SITE_OTHER): Payer: Self-pay

## 2019-11-06 ENCOUNTER — Other Ambulatory Visit: Payer: Self-pay

## 2019-11-06 VITALS — BP 135/89 | HR 98 | Wt 262.6 lb

## 2019-11-06 DIAGNOSIS — N939 Abnormal uterine and vaginal bleeding, unspecified: Secondary | ICD-10-CM

## 2019-11-06 DIAGNOSIS — Z3042 Encounter for surveillance of injectable contraceptive: Secondary | ICD-10-CM

## 2019-11-06 NOTE — Progress Notes (Signed)
Peggy Schmidt here for Depo-Provera  Injection.  Injection administered without complication. Patient will return in 8 weeks per Anyanwu, MD for next injection.  Annabell Howells, RN 11/06/2019  3:19 PM

## 2019-11-08 NOTE — Progress Notes (Signed)
I have reviewed this chart and agree with the RN/CMA assessment and management.    Myra C Dove, MD, FACOG Attending Physician, [facility]  

## 2019-11-12 ENCOUNTER — Encounter: Payer: Self-pay | Admitting: Physician Assistant

## 2019-11-12 ENCOUNTER — Ambulatory Visit (INDEPENDENT_AMBULATORY_CARE_PROVIDER_SITE_OTHER): Payer: Self-pay | Admitting: Physician Assistant

## 2019-11-12 VITALS — Ht 65.0 in | Wt 262.0 lb

## 2019-11-12 DIAGNOSIS — R21 Rash and other nonspecific skin eruption: Secondary | ICD-10-CM

## 2019-11-12 NOTE — Progress Notes (Signed)
Virtual Visit via Video   I connected with Peggy Schmidt on 11/12/19 at  1:20 PM EST by a video enabled telemedicine application and verified that I am speaking with the correct person using two identifiers. Location patient: Home Location provider: [facility], Office Persons participating in the virtual visit: Ladell Pier PA-C, Anselmo Pickler, LPN   I discussed the limitations of evaluation and management by telemedicine and the availability of in person appointments. The patient expressed understanding and agreed to proceed.  I acted as a Education administrator for [employer], [employer], LPN  Subjective:   HPI:   Rash Pt c/o red bumpy rash on abdomen, noticed this morning. Denies itching or pain. No fever or chills. Has not tried any medication.  Did have dog that lives in her home get groomed yesterday. Also did switch to an old laundry detergent that she was using in the past, but hasn't had any issues with this that she knows of.  Bumps are only on her abdomen.  ROS: See pertinent positives and negatives per HPI.  Patient Active Problem List   Diagnosis Date Noted  . DM (diabetes mellitus), type 2 ([place])   . Hyperglycemia 09/21/2018  . Dyslipidemia 09/21/2018  . Morbid obesity ([place]) 09/21/2018  . Abnormal uterine bleeding (AUB) 06/11/2018  . Microcytic anemia 02/15/2018  . Essential hypertension 02/15/2018  . Chronic pain of left knee 02/15/2018  . History of head injury 02/15/2018  . Fibroid uterus 01/15/2016    Social History   Tobacco Use  . Smoking status: Never Smoker  . Smokeless tobacco: Never Used  Substance Use Topics  . Alcohol use: No    Alcohol/week: 0.0 standard drinks    Current Outpatient Medications:  .  fluticasone (FLONASE) 50 MCG/ACT nasal spray, Place 2 sprays into both nostrils daily., Disp: 16 g, Rfl: 6 .  metFORMIN (GLUCOPHAGE) 500 MG tablet, Take 1 tablet (500 mg total) by mouth daily with breakfast., Disp: 90  tablet, Rfl: 3 .  olmesartan-hydrochlorothiazide (BENICAR HCT) 20-12.5 MG tablet, TAKE ONE TABLET BY MOUTH DAILY, Disp: 30 tablet, Rfl: 10  Current Facility-Administered Medications:  .  medroxyPROGESTERone (DEPO-PROVERA) injection 150 mg, 150 mg, Intramuscular, Q8 Weeks, Anyanwu, Ugonna A, MD, 150 mg at 11/06/19 1520  Allergies  Allergen Reactions  . Other     Black walnuts     Objective:   VITALS: Per patient if applicable, see vitals. GENERAL: Alert, appears well and in no acute distress. HEENT: Atraumatic, conjunctiva clear, no obvious abnormalities on inspection of external nose and ears. NECK: Normal movements of the head and neck. CARDIOPULMONARY: No increased WOB. Speaking in clear sentences. I:E ratio WNL.  MS: Moves all visible extremities without noticeable abnormality. PSYCH: Pleasant and cooperative, well-groomed. Speech normal rate and rhythm. Affect is appropriate. Insight and judgement are appropriate. Attention is focused, linear, and appropriate.  NEURO: CN grossly intact. Oriented as arrived to appointment on time with no prompting. Moves both UE equally.  SKIN: erythematous papules scattered on bilateral and central portions of abdomen, no open lesions  Assessment and Plan:   Ladeana was seen today for rash.  Diagnoses and all orders for this visit:  Rash and nonspecific skin eruption   Suspect some sort of possible contact dermatitis. Will trial daily antihistamine (patient has benadryl at home) and OTC hydrocortisone cream. I did recommend that she follow-up with [place] via mychart if symptoms worsen or persist despite treatment, she is currently unemployed and without health insurance.  Marland Kitchen  Reviewed expectations re: course of current medical issues. . Discussed self-management of symptoms. . Outlined signs and symptoms indicating need for more acute intervention. . Patient verbalized understanding and all questions were answered. Marland Kitchen Health Maintenance issues  including appropriate healthy diet, exercise, and smoking avoidance were discussed with patient. . See orders for this visit as documented in the electronic medical record.  I discussed the assessment and treatment plan with the patient. The patient was provided an opportunity to ask questions and all were answered. The patient agreed with the plan and demonstrated an understanding of the instructions.   The patient was advised to call back or seek an in-person evaluation if the symptoms worsen or if the condition fails to improve as anticipated.   CMA or LPN served as scribe during this visit. History, Physical, and Plan performed by medical provider. The above documentation has been reviewed and is accurate and complete.   [place], [place] 11/12/2019

## 2019-11-15 ENCOUNTER — Telehealth: Payer: Self-pay | Admitting: Family Medicine

## 2019-11-15 NOTE — Telephone Encounter (Signed)
Patient is calling to ask Dr. [name] is it ok to Glucosamine Chondroitin with MSM for joint support?  Bottle states Consult your MD. Especially if you have diabetes. Patient is taking Metformin Please advise CB- [phone]

## 2019-11-18 NOTE — Telephone Encounter (Signed)
See note

## 2019-11-19 NOTE — Telephone Encounter (Signed)
Yes that is ok.  Algis Greenhouse. [name], MD 11/19/2019 2:20 PM

## 2019-11-19 NOTE — Telephone Encounter (Signed)
Please advise 

## 2019-11-19 NOTE — Telephone Encounter (Signed)
Reached out to patient via MyChart.  

## 2019-11-25 ENCOUNTER — Ambulatory Visit: Payer: Self-pay

## 2019-11-25 DIAGNOSIS — N921 Excessive and frequent menstruation with irregular cycle: Secondary | ICD-10-CM

## 2019-11-27 MED ORDER — NAPROXEN 500 MG PO TABS: 500.0000 mg | ORAL_TABLET | Freq: Two times a day (BID) | ORAL | 0 refills | Status: AC

## 2019-11-27 MED ORDER — TRANEXAMIC ACID 650 MG PO TABS: 1300.0000 mg | ORAL_TABLET | Freq: Three times a day (TID) | ORAL | 0 refills | Status: AC

## 2020-01-01 ENCOUNTER — Ambulatory Visit (INDEPENDENT_AMBULATORY_CARE_PROVIDER_SITE_OTHER): Payer: Self-pay

## 2020-01-01 ENCOUNTER — Other Ambulatory Visit: Payer: Self-pay

## 2020-01-01 VITALS — BP 140/77 | HR 85 | Wt 260.3 lb

## 2020-01-01 DIAGNOSIS — Z3042 Encounter for surveillance of injectable contraceptive: Secondary | ICD-10-CM

## 2020-01-01 NOTE — Progress Notes (Signed)
Agree with A & P. 

## 2020-01-01 NOTE — Progress Notes (Signed)
Peggy Schmidt here for Depo-Provera  Injection.  Injection administered without complication. Patient will return in 3 months for next injection.  Pt reports breakthrough bleeding while on Depo-Provera. Pt states breakthrough bleeding lasts one day and has happened approx. 6x since last injection. Bleeding is heavier than spotting, enough to need a tampon or pad. Per chart review pt was prescribed Lysteda and Naproxen; discussed with pt. She states she has used Lysteda in the past and it increased her bleeding so she did not fill rx. Pt would not like to try Naproxen at this time. I recommended that pt schedule a follow up appt with Anyanwu, MD to discuss other options. Pt agreeable to this plan.  Annabell Howells, RN 01/01/2020  10:19 AM

## 2020-01-06 ENCOUNTER — Ambulatory Visit: Payer: Self-pay | Admitting: Family Medicine

## 2020-01-13 ENCOUNTER — Other Ambulatory Visit: Payer: Self-pay | Admitting: *Deleted

## 2020-01-13 ENCOUNTER — Other Ambulatory Visit: Payer: Self-pay

## 2020-01-13 DIAGNOSIS — Z124 Encounter for screening for malignant neoplasm of cervix: Secondary | ICD-10-CM

## 2020-01-13 NOTE — Progress Notes (Signed)
Patient: Peggy Schmidt           Date of Birth: [birth year]           MRN: [id number] Visit Date: 01/13/2020 PCP: Vivi Barrack, MD Temp: 97.1 Temporal    Cervical Exam Normal Exam. Patient has no history of an abnormal Pap smear. If today's Pap smear normal next Pap smear due in three years.  Patient's History Patient Active Problem List   Diagnosis Date Noted  . DM (diabetes mellitus), type 2 ([place])   . Hyperglycemia 09/21/2018  . Dyslipidemia 09/21/2018  . Morbid obesity ([place]) 09/21/2018  . Abnormal uterine bleeding (AUB) 06/11/2018  . Microcytic anemia 02/15/2018  . Essential hypertension 02/15/2018  . Chronic pain of left knee 02/15/2018  . History of head injury 02/15/2018  . Fibroid uterus 01/15/2016   Past Medical History:  Diagnosis Date  . Abnormal uterine bleeding   . Anemia   . Asthma   . DM (diabetes mellitus), type 2 ([place])   . Fibroid   . GERD (gastroesophageal reflux disease)   . Hay fever   . Hypertension    pt states her blood pressure has been up and down for the last year  . Post concussion syndrome 2017    Family History  Problem Relation Age of Onset  . Hypertension Mother   . Diabetes Mother   . Depression Mother   . Hyperlipidemia Mother   . Hypertension Brother   . Hypertension Maternal Grandmother   . Diabetes Father   . Hypertension Father     Social History   Occupational History  . Not on file  Tobacco Use  . Smoking status: Never Smoker  . Smokeless tobacco: Never Used  Substance and Sexual Activity  . Alcohol use: No    Alcohol/week: 0.0 standard drinks  . Drug use: No  . Sexual activity: Yes    Birth control/protection: Injection    Comment: female partner

## 2020-01-14 ENCOUNTER — Ambulatory Visit: Payer: Self-pay | Admitting: Family Medicine

## 2020-01-14 DIAGNOSIS — Z0289 Encounter for other administrative examinations: Secondary | ICD-10-CM

## 2020-01-14 LAB — CYTOLOGY - PAP: Diagnosis: NEGATIVE

## 2020-01-20 ENCOUNTER — Telehealth (HOSPITAL_COMMUNITY): Payer: Self-pay | Admitting: *Deleted

## 2020-01-20 NOTE — Telephone Encounter (Signed)
Normal Pap smear result letter mailed to patient 01/20/2020.

## 2020-01-22 ENCOUNTER — Telehealth: Payer: Self-pay | Admitting: Obstetrics & Gynecology

## 2020-01-22 NOTE — Telephone Encounter (Signed)
Called pt and informed pt that we will need to do a self swab before treating because it could be just that she irritated inside the vagina because she cleansed inside of it and it not be due to BV, or yeast.  Pt verbalized understanding.   Mel Almond, RN

## 2020-01-22 NOTE — Telephone Encounter (Signed)
The patient stated she would like medication prescribed as she is itching and experiencing irritation. She thinks it may be due to using [employer].

## 2020-01-24 ENCOUNTER — Other Ambulatory Visit: Payer: Self-pay

## 2020-01-24 ENCOUNTER — Encounter: Payer: Self-pay | Admitting: *Deleted

## 2020-01-24 ENCOUNTER — Encounter (HOSPITAL_COMMUNITY): Payer: Self-pay

## 2020-01-24 ENCOUNTER — Ambulatory Visit (HOSPITAL_COMMUNITY)
Admission: EM | Admit: 2020-01-24 | Discharge: 2020-01-24 | Disposition: A | Discharge status: Home / Self Care | Payer: Self-pay | Attending: Family Medicine | Admitting: Family Medicine

## 2020-01-24 DIAGNOSIS — E1165 Type 2 diabetes mellitus with hyperglycemia: Secondary | ICD-10-CM

## 2020-01-24 DIAGNOSIS — Z3202 Encounter for pregnancy test, result negative: Secondary | ICD-10-CM

## 2020-01-24 DIAGNOSIS — N760 Acute vaginitis: Secondary | ICD-10-CM

## 2020-01-24 LAB — GLUCOSE, CAPILLARY: Glucose-Capillary: 370 mg/dL — ABNORMAL HIGH (ref 70–99)

## 2020-01-24 LAB — POCT URINALYSIS DIP (DEVICE)
Bilirubin Urine: NEGATIVE
Glucose, UA: >=1000 mg/dL — AB
Ketones, ur: NEGATIVE mg/dL
Leukocytes,Ua: NEGATIVE
Nitrite: NEGATIVE
Protein, ur: NEGATIVE mg/dL
Specific Gravity, Urine: 1.02 (ref 1.005–1.030)
Urobilinogen, UA: 0.2 mg/dL (ref 0.0–1.0)
pH: 6.5 (ref 5.0–8.0)

## 2020-01-24 LAB — POCT PREGNANCY, URINE: Preg Test, Ur: NEGATIVE

## 2020-01-24 LAB — CBG MONITORING, ED: Glucose-Capillary: 370 mg/dL — ABNORMAL HIGH (ref 70–99)

## 2020-01-24 LAB — POC URINE PREG, ED: Preg Test, Ur: NEGATIVE

## 2020-01-24 MED ORDER — CLOTRIMAZOLE 1 % EX CREA: TOPICAL_CREAM | CUTANEOUS | 0 refills | Status: DC

## 2020-01-24 MED ORDER — FLUCONAZOLE 150 MG PO TABS: 150.0000 mg | ORAL_TABLET | Freq: Once | ORAL | 0 refills | Status: AC

## 2020-01-24 NOTE — ED Triage Notes (Signed)
Vaginal itching x 4-5 days.  Feels irritated.  No discharge.  No sexual intercourse.

## 2020-01-24 NOTE — Discharge Instructions (Addendum)
Take 1 tablet of Diflucan today; may repeat in 3 to 4 days if swab positive for yeast and still having symptoms Apply clotrimazole cream twice daily to area between glutes and skin in genital area Please try to let the area air out if possible We will call with results of swab and provide further treatment if needed  Please follow-up with primary care to follow-up of blood pressure and diabetes.

## 2020-01-24 NOTE — ED Provider Notes (Signed)
[place]    CSN: [id number] Arrival date & time: 01/24/20  [id number]      History   Chief Complaint Chief Complaint  Patient presents with  . Vaginal Itching    HPI Peggy Schmidt is a 39 y.o. female history of DM type II, hypertension, fibroids, presenting today for evaluation of vaginal itching.  Patient states that she has had itching for the past 4 to 5 days.  She denies any discharge.  Denies recent sexual intercourse.  Has history of prior BV, but this feels different than typical BV symptoms.  Reports genital area has seemed inflamed and itching is spreading towards rectal area.  Denies any rashes.  Denies fever, nausea, vomiting, abdominal pain or back pain.  Does report urinary frequency.  Denies dysuria or urgency.  HPI  Past Medical History:  Diagnosis Date  . Abnormal uterine bleeding   . Anemia   . Asthma   . DM (diabetes mellitus), type 2 ([place])   . Fibroid   . GERD (gastroesophageal reflux disease)   . Hay fever   . Hypertension    pt states her blood pressure has been up and down for the last year  . Post concussion syndrome 2017    Patient Active Problem List   Diagnosis Date Noted  . DM (diabetes mellitus), type 2 ([place])   . Hyperglycemia 09/21/2018  . Dyslipidemia 09/21/2018  . Morbid obesity ([place]) 09/21/2018  . Abnormal uterine bleeding (AUB) 06/11/2018  . Microcytic anemia 02/15/2018  . Essential hypertension 02/15/2018  . Chronic pain of left knee 02/15/2018  . History of head injury 02/15/2018  . Fibroid uterus 01/15/2016    Past Surgical History:  Procedure Laterality Date  . ANKLE FRACTURE SURGERY      OB History    Gravida  0   Para  0   Term  0   Preterm  0   AB  0   Living  0     SAB  0   TAB  0   Ectopic  0   Multiple  0   Live Births  0            Home Medications    Prior to Admission medications   Medication Sig Start Date End Date Taking? Authorizing Provider  clotrimazole (LOTRIMIN) 1 %  cream Apply to affected area 2 times daily 01/24/20   Darcel Frane C, PA-C  fluconazole (DIFLUCAN) 150 MG tablet Take 1 tablet (150 mg total) by mouth once for 1 dose. 01/24/20 01/24/20  Erion Hermans C, PA-C  fluticasone (FLONASE) 50 MCG/ACT nasal spray Place 2 sprays into both nostrils daily. 05/09/19   Vivi Barrack, MD  metFORMIN (GLUCOPHAGE) 500 MG tablet Take 1 tablet (500 mg total) by mouth daily with breakfast. 06/07/19   Vivi Barrack, MD  olmesartan-hydrochlorothiazide [facility] HCT) 20-12.5 MG tablet TAKE ONE TABLET BY MOUTH DAILY 10/02/19   Vivi Barrack, MD    Family History Family History  Problem Relation Age of Onset  . Hypertension Mother   . Diabetes Mother   . Depression Mother   . Hyperlipidemia Mother   . Hypertension Brother   . Hypertension Maternal Grandmother   . Diabetes Father   . Hypertension Father     Social History Social History   Tobacco Use  . Smoking status: Never Smoker  . Smokeless tobacco: Never Used  Substance Use Topics  . Alcohol use: No    Alcohol/week: 0.0 standard drinks  .  Drug use: No     Allergies   Other   Review of Systems Review of Systems  Constitutional: Negative for fever.  Respiratory: Negative for shortness of breath.   Cardiovascular: Negative for chest pain.  Gastrointestinal: Negative for abdominal pain, diarrhea, nausea and vomiting.  Genitourinary: Negative for dysuria, flank pain, genital sores, hematuria, menstrual problem, vaginal bleeding, vaginal discharge and vaginal pain.  Musculoskeletal: Negative for back pain.  Skin: Negative for rash.  Neurological: Negative for dizziness, light-headedness and headaches.     Physical Exam Triage Vital Signs ED Triage Vitals  Enc Vitals Group     BP 01/24/20 1120 (!) 179/96     Pulse Rate 01/24/20 1120 86     Resp --      Temp 01/24/20 1120 98.5 F (36.9 C)     Temp Source 01/24/20 1120 Oral     SpO2 01/24/20 1120 99 %     Weight --      Height --       Head Circumference --      Peak Flow --      Pain Score 01/24/20 1117 0     Pain Loc --      Pain Edu? --      Excl. in [place]? --    No data found.  Updated Vital Signs BP (!) 136/91 (BP Location: Right Arm)   Pulse 86   Temp 98.5 F (36.9 C) (Oral)   SpO2 99%   Visual Acuity Right Eye Distance:   Left Eye Distance:   Bilateral Distance:    Right Eye Near:   Left Eye Near:    Bilateral Near:     Physical Exam Vitals and nursing note reviewed.  Constitutional:      Appearance: She is well-developed.     Comments: No acute distress  HENT:     Head: Normocephalic and atraumatic.     Nose: Nose normal.  Eyes:     Conjunctiva/sclera: Conjunctivae normal.  Cardiovascular:     Rate and Rhythm: Normal rate.  Pulmonary:     Effort: Pulmonary effort is normal. No respiratory distress.  Abdominal:     General: There is no distension.  Genitourinary:    Comments: Mucosal surfaces of labia majora and minora appear erythematous, areas of excoriation visible, no significant discharge present in vagina  Mild erythema noted to skin around anus, no induration or fluctuance, nontender to touch Musculoskeletal:        General: Normal range of motion.     Cervical back: Neck supple.  Skin:    General: Skin is warm and dry.  Neurological:     Mental Status: She is alert and oriented to person, place, and time.      UC Treatments / Results  Labs (all labs ordered are listed, but only abnormal results are displayed) Labs Reviewed  GLUCOSE, CAPILLARY - Abnormal; Notable for the following components:      Result Value   Glucose-Capillary 370 (*)    All other components within normal limits  CBG MONITORING, ED - Abnormal; Notable for the following components:   Glucose-Capillary 370 (*)    All other components within normal limits  POCT URINALYSIS DIP (DEVICE) - Abnormal; Notable for the following components:   Glucose, UA >=1000 (*)    Hgb urine dipstick TRACE (*)    All  other components within normal limits  POC URINE PREG, ED  POCT PREGNANCY, URINE  CERVICOVAGINAL ANCILLARY ONLY    EKG  Radiology No results found.  Procedures Procedures (including critical care time)  Medications Ordered in UC Medications - No data to display  Initial Impression / Assessment and Plan / UC Course  I have reviewed the triage vital signs and the nursing notes.  Pertinent labs & imaging results that were available during my care of the patient were reviewed by me and considered in my medical decision making (see chart for details).     Diabetes uncontrolled, blood sugar 370 today, 1000 glucose in urine, likely yeast causing symptoms.  Will treat with Diflucan today along with clotrimazole topically for skin areas.  Recommended following up with PCP for recheck of diabetes and further management.  No signs of UTI.  Do not suspect STDs at this time.  Vaginal swab pending.  Will call with results and provide further treatment as needed.  Discussed strict return precautions. Patient verbalized understanding and is agreeable with plan.  Final Clinical Impressions(s) / UC Diagnoses   Final diagnoses:  Vaginitis and vulvovaginitis  Type 2 diabetes mellitus with hyperglycemia, without long-term current use of insulin (HCC)     Discharge Instructions     Take 1 tablet of Diflucan today; may repeat in 3 to 4 days if swab positive for yeast and still having symptoms Apply clotrimazole cream twice daily to area between glutes and skin in genital area Please try to let the area air out if possible We will call with results of swab and provide further treatment if needed  Please follow-up with primary care to follow-up of blood pressure and diabetes.    ED Prescriptions    Medication Sig Dispense Auth. Provider   fluconazole (DIFLUCAN) 150 MG tablet Take 1 tablet (150 mg total) by mouth once for 1 dose. 2 tablet Zhana Jeangilles C, PA-C   clotrimazole (LOTRIMIN) 1  % cream Apply to affected area 2 times daily 15 g Kayler Rise, [place] C, PA-C     PDMP not reviewed this encounter.   Janith Lima, [place] 01/24/20 1212

## 2020-01-27 ENCOUNTER — Other Ambulatory Visit: Payer: Self-pay

## 2020-01-28 ENCOUNTER — Encounter: Payer: Self-pay | Admitting: Family Medicine

## 2020-01-28 ENCOUNTER — Ambulatory Visit (INDEPENDENT_AMBULATORY_CARE_PROVIDER_SITE_OTHER): Payer: Self-pay | Admitting: Family Medicine

## 2020-01-28 VITALS — BP 122/78 | HR 89 | Temp 98.0°F | Ht 65.0 in | Wt 261.5 lb

## 2020-01-28 DIAGNOSIS — I10 Essential (primary) hypertension: Secondary | ICD-10-CM

## 2020-01-28 DIAGNOSIS — E119 Type 2 diabetes mellitus without complications: Secondary | ICD-10-CM

## 2020-01-28 LAB — POCT GLYCOSYLATED HEMOGLOBIN (HGB A1C): Hemoglobin A1C: 10.3 % — AB (ref 4.0–5.6)

## 2020-01-28 MED ORDER — METFORMIN HCL 1000 MG PO TABS: 1000.0000 mg | ORAL_TABLET | Freq: Two times a day (BID) | ORAL | 3 refills | Status: DC

## 2020-01-28 NOTE — Patient Instructions (Signed)
It was very nice to see you today!  Your A1c is very elevated.  We will increase your Metformin to 1000 mg twice daily.  Please continue working on diet and exercise.  Please use glucosamine-chondroitin for your knees if needed.  Please let us know if we can be of any further assistance and we wish you the best of luck with your move!  Take care, Dr [name]  Please try these tips to maintain a healthy lifestyle:   Eat at least 3 REAL meals and 1-2 snacks per day.  Aim for no more than 5 hours between eating.  If you eat breakfast, please do so within one hour of getting up.    Each meal should contain half fruits/vegetables, one quarter protein, and one quarter carbs (no bigger than a computer mouse)   Cut down on sweet beverages. This includes juice, soda, and sweet tea.     Drink at least 1 glass of water with each meal and aim for at least 8 glasses per day   Exercise at least 150 minutes every week.

## 2020-01-28 NOTE — Assessment & Plan Note (Signed)
A1c uncontrolled at 10.3.  Discussed lifestyle modifications.  Will increase Metformin to 1000 mg twice daily.  She will be moving to [place] next month.  Advised her to follow-up with new PCP in the next 3 months to recheck A1c.

## 2020-01-28 NOTE — Progress Notes (Signed)
   DEENAH GRAEF is a 39 y.o. female who presents today for an office visit.  Assessment/Plan:  Chronic Problems Addressed Today: DM (diabetes mellitus), type 2 (HCC) A1c uncontrolled at 10.3.  Discussed lifestyle modifications.  Will increase Metformin to 1000 mg twice daily.  She will be moving to [place] next month.  Advised her to follow-up with new PCP in the next 3 months to recheck A1c.  Essential hypertension At goal.  Continue olmesartan-HCTZ 20-12.5 once daily.    Subjective:  HPI:  See A/P.      Objective:  Physical Exam: BP 122/78   Pulse 89   Temp 98 F (36.7 C)   Ht 5\' 5"  (1.651 m)   Wt 261 lb 8 oz (118.6 kg)   SpO2 98%   BMI 43.52 kg/m   Gen: No acute distress, resting comfortably CV: Regular rate and rhythm with no murmurs appreciated Pulm: Normal work of breathing, clear to auscultation bilaterally with no crackles, wheezes, or rhonchi Neuro: Grossly normal, moves all extremities Psych: Normal affect and thought content      Kennadie Brenner M. [name], MD 01/28/2020 9:16 AM

## 2020-01-28 NOTE — Assessment & Plan Note (Signed)
At goal.  Continue olmesartan-HCTZ 20-12.5 once daily.

## 2020-01-29 ENCOUNTER — Other Ambulatory Visit: Payer: Self-pay

## 2020-01-29 ENCOUNTER — Ambulatory Visit (INDEPENDENT_AMBULATORY_CARE_PROVIDER_SITE_OTHER): Payer: Self-pay | Admitting: *Deleted

## 2020-01-29 DIAGNOSIS — N898 Other specified noninflammatory disorders of vagina: Secondary | ICD-10-CM

## 2020-01-29 LAB — CERVICOVAGINAL ANCILLARY ONLY
Bacterial vaginitis: NEGATIVE
Candida vaginitis: POSITIVE — AB
Chlamydia: NEGATIVE
Neisseria Gonorrhea: NEGATIVE
Trichomonas: NEGATIVE

## 2020-01-29 MED ORDER — TERCONAZOLE 0.4 % VA CREA: 1.0000 | TOPICAL_CREAM | Freq: Every day | VAGINAL | 0 refills | Status: DC

## 2020-01-29 NOTE — Progress Notes (Addendum)
Pt presents with c/o vaginal itching for over 1 week. She was recently treated for yeast infection following ED visit on 2/5 however has not had complete relief. Vaginal swab obtained on 2/5 @ ED was negative for BV, GC/CT and trich. Per consult w/Terri Ephraim Hamburger, NP, Rx for Terazol was e-prescribed. All questions answered to pt's satisfaction.  Chart reviewed for nurse visit. Agree with plan of care.   Virginia [place], NP 01/29/2020 9:26 PM

## 2020-02-20 ENCOUNTER — Ambulatory Visit: Payer: Self-pay | Attending: Internal Medicine

## 2020-02-20 DIAGNOSIS — Z20822 Contact with and (suspected) exposure to covid-19: Secondary | ICD-10-CM

## 2020-02-21 LAB — NOVEL CORONAVIRUS, NAA: SARS-CoV-2, NAA: NOT DETECTED

## 2020-02-26 ENCOUNTER — Ambulatory Visit (INDEPENDENT_AMBULATORY_CARE_PROVIDER_SITE_OTHER): Payer: Self-pay | Admitting: Obstetrics & Gynecology

## 2020-02-26 ENCOUNTER — Encounter: Payer: Self-pay | Admitting: Obstetrics & Gynecology

## 2020-02-26 ENCOUNTER — Other Ambulatory Visit: Payer: Self-pay

## 2020-02-26 VITALS — BP 132/88 | HR 90 | Wt 261.2 lb

## 2020-02-26 DIAGNOSIS — D259 Leiomyoma of uterus, unspecified: Secondary | ICD-10-CM

## 2020-02-26 DIAGNOSIS — Z3042 Encounter for surveillance of injectable contraceptive: Secondary | ICD-10-CM

## 2020-02-26 DIAGNOSIS — N939 Abnormal uterine and vaginal bleeding, unspecified: Secondary | ICD-10-CM

## 2020-02-26 NOTE — Patient Instructions (Signed)
Return to clinic for any scheduled appointments or for any gynecologic concerns as needed.   

## 2020-02-26 NOTE — Progress Notes (Signed)
   GYNECOLOGY OFFICE VISIT NOTE  History:   Peggy Schmidt is a 39 y.o. G0 here today for follow up of AUB in the setting of known thickened endometrium, morbid obesity and 5-6 cm fibroid.  She was started on Depo Provera 150 mg every 8 weeks to help control her AUB. The first cycle, she had about 5-6 episodes of breakthrough bleeding; but only had one episode after the second shot.  She denies any current abnormal vaginal discharge, bleeding, pelvic pain or other concerns.  She is moving to [place] soon, wants to discuss other management options that she may discuss with her new GYN in [place]. Wants to retain her fertility.    Past Medical History:  Diagnosis Date  . Abnormal uterine bleeding   . Anemia   . Asthma   . DM (diabetes mellitus), type 2 ([place])   . Fibroid   . GERD (gastroesophageal reflux disease)   . Hay fever   . Hypertension    pt states her blood pressure has been up and down for the last year  . Post concussion syndrome 2017    Past Surgical History:  Procedure Laterality Date  . ANKLE FRACTURE SURGERY      The following portions of the patient's history were reviewed and updated as appropriate: allergies, current medications, past family history, past medical history, past social history, past surgical history and problem list.   Health Maintenance:  Normal pap in 2018 as per patient.  Review of Systems:  Pertinent items noted in HPI and remainder of comprehensive ROS otherwise negative.  Physical Exam:  BP 132/88   Pulse 90   Wt 261 lb 3.2 oz (118.5 kg)   BMI 43.47 kg/m  CONSTITUTIONAL: Well-developed, well-nourished female in no acute distress.  MUSCULOSKELETAL: Normal range of motion. No edema noted. NEUROLOGIC: Alert and oriented to person, place, and time. Normal muscle tone coordination. No cranial nerve deficit noted. PSYCHIATRIC: Normal mood and affect. Normal behavior. Normal judgment and thought content. CARDIOVASCULAR: Normal heart rate  noted RESPIRATORY: Effort and breath sounds normal, no problems with respiration noted ABDOMEN: No masses noted. No other overt distention noted.   PELVIC: Deferred     Assessment and Plan:      1. Abnormal uterine bleeding (AUB) 2. Uterine leiomyoma, unspecified location Discussed management options for abnormal uterine bleeding including NSAIDs (Naproxen), tranexamic acid (Lysteda), oral progesterone, Depo Provera, Levonogestrel IUD.  Dilation and Curettage also discussed but emphasized this was a temporary fix, also discussed myomectomy.  Other surgeries/interventions not discussed in detail that may interfere with fertility: uterine fibroid embolization, endometrial ablation or hysterectomy.  Discussed risks and benefits of each method.   Patient desires to continue Depo Provera for now, shot given today. Will discuss further with new GYN. Bleeding precautions reviewed.  Routine preventative health maintenance measures emphasized. Please refer to After Visit Summary for other counseling recommendations.   Return for any gynecologic concerns.    Total face-to-face time with patient: 15 minutes.  Over 50% of encounter was spent on counseling and coordination of care.   Verita Schneiders, MD, [place] for [employer], [place]

## 2020-03-10 ENCOUNTER — Encounter: Payer: Self-pay | Admitting: Family Medicine

## 2020-03-10 ENCOUNTER — Ambulatory Visit (INDEPENDENT_AMBULATORY_CARE_PROVIDER_SITE_OTHER): Payer: Self-pay | Admitting: Family Medicine

## 2020-03-10 DIAGNOSIS — K0889 Other specified disorders of teeth and supporting structures: Secondary | ICD-10-CM

## 2020-03-10 MED ORDER — KETOROLAC TROMETHAMINE 10 MG PO TABS: 10.0000 mg | ORAL_TABLET | Freq: Four times a day (QID) | ORAL | 0 refills | Status: DC | PRN

## 2020-03-10 MED ORDER — AMOXICILLIN 875 MG PO TABS: 875.0000 mg | ORAL_TABLET | Freq: Two times a day (BID) | ORAL | 0 refills | Status: DC

## 2020-03-10 NOTE — Progress Notes (Signed)
   Peggy Schmidt is a 39 y.o. female who presents today for a virtual office visit.  Assessment/Plan:  New/Acute Problems: Dental pain Sent in Toradol at this is worked well for her.  We will also send in a pocket prescription for amoxicillin.  She will follow-up with dentistry.  Discussed reasons to return to care.  Follow-up as needed.    Subjective:  HPI: Patient has been dealing with dental pain for the past several months.  She had a fractured tooth a few months ago.  Pain has been worsening.  She is currently looking for a dentist.  She does not have any fevers or chills.  No drainage.  She is not concerned about infection.       Objective/Observations  Physical Exam: Gen: NAD, resting comfortably Pulm: Normal work of breathing Neuro: Grossly normal, moves all extremities Psych: Normal affect and thought content  Virtual Visit via Video   I connected with [name] on 03/10/20 at 11:20 AM EDT by a video enabled telemedicine application and verified that I am speaking with the correct person using two identifiers. The limitations of evaluation and management by telemedicine and the availability of in person appointments were discussed. The patient expressed understanding and agreed to proceed.   Patient location: Home Provider location: [place] participating in the virtual visit: Myself and Patient     Algis Greenhouse. [name], MD 03/10/2020 11:44 AM

## 2020-03-27 ENCOUNTER — Telehealth (INDEPENDENT_AMBULATORY_CARE_PROVIDER_SITE_OTHER): Payer: Self-pay | Admitting: Family Medicine

## 2020-03-27 VITALS — Ht 65.0 in | Wt 256.0 lb

## 2020-03-27 DIAGNOSIS — T881XXA Other complications following immunization, not elsewhere classified, initial encounter: Secondary | ICD-10-CM

## 2020-03-27 DIAGNOSIS — R21 Rash and other nonspecific skin eruption: Secondary | ICD-10-CM

## 2020-03-27 NOTE — Progress Notes (Signed)
   Peggy Schmidt is a 39 y.o. female who presents today for a virtual office visit.  Assessment/Plan:  Rash No red flags.  Consistent with local reaction to Covid vaccine.  No signs of infection or cellulitis.  Symptoms are overall stable.  Recommended cool compresses to the area.  Can use over-the-counter meds as needed for pain.  Should improve over the next week or so.  Discussed reasons return to care.     Subjective:  HPI:  Patient received Covid vaccine 5 days ago.  Since then she has noticed red patch on the area where she received the vaccine.  It is painful to touch.  Occasionally painful with movement.  No heat emanating from the area.  No fevers or chills.  No reported nausea or vomiting.  No specific treatments tried.        Objective/Observations  Physical Exam: Gen: NAD, resting comfortably Pulm: Normal work of breathing Neuro: Grossly normal, moves all extremities Psych: Normal affect and thought content Skin: Approximately 2 to 3 cm mildly erythematous area on right deltoid  Virtual Visit via Video   I connected with Peggy Schmidt on 03/27/20 at 11:00 AM EDT by a video enabled telemedicine application and verified that I am speaking with the correct person using two identifiers. The limitations of evaluation and management by telemedicine and the availability of in person appointments were discussed. The patient expressed understanding and agreed to proceed.   Patient location: Home Provider location: [place] participating in the virtual visit: Myself and Patient     Algis Greenhouse. [name], MD 03/27/2020 11:16 AM

## 2020-05-05 ENCOUNTER — Other Ambulatory Visit: Payer: Self-pay

## 2020-05-05 ENCOUNTER — Ambulatory Visit (INDEPENDENT_AMBULATORY_CARE_PROVIDER_SITE_OTHER): Payer: Self-pay | Admitting: Family Medicine

## 2020-05-05 ENCOUNTER — Encounter: Payer: Self-pay | Admitting: Family Medicine

## 2020-05-05 VITALS — BP 128/88 | HR 110 | Temp 98.0°F | Ht 65.0 in | Wt 258.4 lb

## 2020-05-05 DIAGNOSIS — Z3009 Encounter for other general counseling and advice on contraception: Secondary | ICD-10-CM

## 2020-05-05 DIAGNOSIS — N939 Abnormal uterine and vaginal bleeding, unspecified: Secondary | ICD-10-CM

## 2020-05-05 LAB — CBC
HCT: 41.3 % (ref 36.0–46.0)
Hemoglobin: 13.7 g/dL (ref 12.0–15.0)
MCHC: 33.1 g/dL (ref 30.0–36.0)
MCV: 83.6 fl (ref 78.0–100.0)
Platelets: 375 10*3/uL (ref 150.0–400.0)
RBC: 4.95 Mil/uL (ref 3.87–5.11)
RDW: 14.3 % (ref 11.5–15.5)
WBC: 8.9 10*3/uL (ref 4.0–10.5)

## 2020-05-05 MED ORDER — NAPROXEN 500 MG PO TABS: 500.0000 mg | ORAL_TABLET | Freq: Two times a day (BID) | ORAL | 0 refills | Status: DC

## 2020-05-05 MED ORDER — MEDROXYPROGESTERONE ACETATE 150 MG/ML IM SUSP
150.0000 mg | Freq: Once | INTRAMUSCULAR | Status: AC
  Administered 2020-05-05: 150 mg via INTRAMUSCULAR

## 2020-05-05 NOTE — Patient Instructions (Signed)
It was very nice to see you today!  We will give you repeat depo today.   Please take the naproxen as needed for breakthrough pain.  I will check on your referral to see Dr Talbert Nan.   Take care, Dr [name]  Please try these tips to maintain a healthy lifestyle:   Eat at least 3 REAL meals and 1-2 snacks per day.  Aim for no more than 5 hours between eating.  If you eat breakfast, please do so within one hour of getting up.    Each meal should contain half fruits/vegetables, one quarter protein, and one quarter carbs (no bigger than a computer mouse)   Cut down on sweet beverages. This includes juice, soda, and sweet tea.     Drink at least 1 glass of water with each meal and aim for at least 8 glasses per day   Exercise at least 150 minutes every week.

## 2020-05-05 NOTE — Progress Notes (Signed)
   Peggy Schmidt is a 39 y.o. female who presents today for an office visit.  Assessment/Plan:  Chronic Problems Addressed Today: Abnormal uterine bleeding (AUB) Breathrough bleeding for the last few weeks while on depoprovera. Will give depo-provera early today and check on referral to GYN. Will give prescription for naproxen to use as needed for breakthrough bleeding. Check CBC.      Subjective:  HPI:  Patient with breakthrough bleeding.  She has been on Depo-Provera for over a year and has had intermittent issues with abnormal uterine bleeding.  Currently had bleeding starting about 2-2 and half weeks ago.  She is passing large clots.  She has been taking an iron supplement.  She saw gynecology couple months ago and had Depo-Provera injection at that time.  About 6 months ago she had ultrasound which showed endometrial thickening and no fibroids. No reported chest pain or shortness of breath.        Objective:  Physical Exam: BP 128/88 (BP Location: Left Arm, Patient Position: Sitting, Cuff Size: Large)   Pulse (!) 110   Temp 98 F (36.7 C) (Temporal)   Ht 5\' 5"  (1.651 m)   Wt 258 lb 6.4 oz (117.2 kg)   SpO2 98%   BMI 43.00 kg/m   Gen: No acute distress, resting comfortably Neuro: Grossly normal, moves all extremities Psych: Normal affect and thought content      Jaydyn Menon M. [name], MD 05/05/2020 1:19 PM

## 2020-05-05 NOTE — Assessment & Plan Note (Addendum)
Breathrough bleeding for the last few weeks while on depoprovera. Will give depo-provera early today and check on referral to GYN. Will give prescription for naproxen to use as needed for breakthrough bleeding. Check CBC.

## 2020-05-05 NOTE — Progress Notes (Signed)
1 

## 2020-05-06 NOTE — Progress Notes (Signed)
Please inform patient of the following:  Blood counts are stable - recommend she call Dr Albertina [place] office to schedule an appointment.  Peggy Schmidt. [name], MD 05/06/2020 9:25 AM

## 2020-05-08 ENCOUNTER — Ambulatory Visit: Payer: Self-pay | Admitting: Family Medicine

## 2020-05-13 ENCOUNTER — Ambulatory Visit: Payer: Self-pay

## 2020-05-16 ENCOUNTER — Other Ambulatory Visit: Payer: Self-pay

## 2020-05-16 ENCOUNTER — Encounter (HOSPITAL_COMMUNITY): Payer: Self-pay | Admitting: *Deleted

## 2020-05-16 ENCOUNTER — Ambulatory Visit (HOSPITAL_COMMUNITY)
Admission: EM | Admit: 2020-05-16 | Discharge: 2020-05-16 | Disposition: A | Discharge status: Home / Self Care | Payer: Self-pay

## 2020-05-16 DIAGNOSIS — K0889 Other specified disorders of teeth and supporting structures: Secondary | ICD-10-CM

## 2020-05-16 MED ORDER — DICLOFENAC SODIUM 75 MG PO TBEC: 75.0000 mg | DELAYED_RELEASE_TABLET | Freq: Two times a day (BID) | ORAL | 0 refills | Status: DC

## 2020-05-16 MED ORDER — AMOXICILLIN 500 MG PO TABS: 500.0000 mg | ORAL_TABLET | Freq: Three times a day (TID) | ORAL | 0 refills | Status: DC

## 2020-05-16 NOTE — ED Provider Notes (Signed)
[place]    CSN: [id number] Arrival date & time: 05/16/20  1232      History   Chief Complaint Chief Complaint  Patient presents with  . Dental Pain    HPI Peggy Schmidt is a 39 y.o. female.   The history is provided by the patient. No language interpreter was used.  Dental Pain Location:  Lower Lower teeth location:  19/LL 1st molar Quality:  Aching Severity:  Moderate Onset quality:  Gradual Timing:  Constant Progression:  Worsening Chronicity:  New Relieved by:  Nothing Worsened by:  Nothing Associated symptoms: no fever     Past Medical History:  Diagnosis Date  . Abnormal uterine bleeding   . Anemia   . Asthma   . DM (diabetes mellitus), type 2 ([place])   . Fibroid   . GERD (gastroesophageal reflux disease)   . Hay fever   . Hypertension    pt states her blood pressure has been up and down for the last year  . Post concussion syndrome 2017    Patient Active Problem List   Diagnosis Date Noted  . DM (diabetes mellitus), type 2 ([place])   . Dyslipidemia 09/21/2018  . Morbid obesity ([place]) 09/21/2018  . Abnormal uterine bleeding (AUB) 06/11/2018  . Microcytic anemia 02/15/2018  . Essential hypertension 02/15/2018  . Chronic pain of left knee 02/15/2018  . History of head injury 02/15/2018  . Fibroid uterus 01/15/2016    Past Surgical History:  Procedure Laterality Date  . ANKLE FRACTURE SURGERY      OB History    Gravida  0   Para  0   Term  0   Preterm  0   AB  0   Living  0     SAB  0   TAB  0   Ectopic  0   Multiple  0   Live Births  0            Home Medications    Prior to Admission medications   Medication Sig Start Date End Date Taking? Authorizing Provider  Ferrous Sulfate (IRON PO) Take by mouth.   Yes [provider]  fluticasone (FLONASE) 50 MCG/ACT nasal spray Place 2 sprays into both nostrils daily. 05/09/19  Yes Vivi Barrack, MD  metFORMIN (GLUCOPHAGE) 1000 MG tablet Take 1 tablet  (1,000 mg total) by mouth 2 (two) times daily with a meal. 01/28/20  Yes Vivi Barrack, MD  olmesartan-hydrochlorothiazide (BENICAR HCT) 20-12.5 MG tablet TAKE ONE TABLET BY MOUTH DAILY 10/02/19  Yes Vivi Barrack, MD  amoxicillin (AMOXIL) 500 MG tablet Take 1 tablet (500 mg total) by mouth 3 (three) times daily. 05/16/20   [name], PA-C  diclofenac (VOLTAREN) 75 MG EC tablet Take 1 tablet (75 mg total) by mouth 2 (two) times daily. 05/16/20   [name], PA-C  ketorolac (TORADOL) 10 MG tablet Take 1 tablet (10 mg total) by mouth every 6 (six) hours as needed. Patient not taking: Reported on 05/05/2020 03/10/20   Vivi Barrack, MD  naproxen (NAPROSYN) 500 MG tablet Take 1 tablet (500 mg total) by mouth 2 (two) times daily with a meal. 05/05/20   Vivi Barrack, MD    Family History Family History  Problem Relation Age of Onset  . Hypertension Mother   . Diabetes Mother   . Depression Mother   . Hyperlipidemia Mother   . Hypertension Brother   . Hypertension Maternal Grandmother   .  Diabetes Father   . Hypertension Father     Social History Social History   Tobacco Use  . Smoking status: Never Smoker  . Smokeless tobacco: Never Used  Substance Use Topics  . Alcohol use: No  . Drug use: No     Allergies   Other   Review of Systems Review of Systems  Constitutional: Negative for fever.  HENT: Positive for dental problem.   All other systems reviewed and are negative.    Physical Exam Triage Vital Signs ED Triage Vitals [05/16/20 1251]  Enc Vitals Group     BP 130/86     Pulse Rate 100     Resp 16     Temp 98.5 F (36.9 C)     Temp Source Oral     SpO2 97 %     Weight      Height      Head Circumference      Peak Flow      Pain Score 9     Pain Loc      Pain Edu?      Excl. in [place]?    No data found.  Updated Vital Signs BP 130/86   Pulse 100   Temp 98.5 F (36.9 C) (Oral)   Resp 16   SpO2 97%   Visual Acuity Right Eye Distance:     Left Eye Distance:   Bilateral Distance:    Right Eye Near:   Left Eye Near:    Bilateral Near:     Physical Exam Vitals reviewed.  Constitutional:      Appearance: Normal appearance.  HENT:     Mouth/Throat:     Mouth: Mucous membranes are moist.     Comments: Swollen gumline nv and ns intact  Cardiovascular:     Rate and Rhythm: Normal rate.  Pulmonary:     Effort: Pulmonary effort is normal.  Skin:    General: Skin is warm.  Neurological:     General: No focal deficit present.     Mental Status: She is alert.  Psychiatric:        Mood and Affect: Mood normal.      UC Treatments / Results  Labs (all labs ordered are listed, but only abnormal results are displayed) Labs Reviewed - No data to display  EKG   Radiology No results found.  Procedures Procedures (including critical care time)  Medications Ordered in UC Medications - No data to display  Initial Impression / Assessment and Plan / UC Course  I have reviewed the triage vital signs and the nursing notes.  Pertinent labs & imaging results that were available during my care of the patient were reviewed by me and considered in my medical decision making (see chart for details).     MDM: Pt advised she needs to see the dentist for evaluation  Final Clinical Impressions(s) / UC Diagnoses   Final diagnoses:  Pain, dental     Discharge Instructions     Return if any problems.  Call the dentist on Monday to schedule to be seen    ED Prescriptions    Medication Sig Dispense Auth. Provider   amoxicillin (AMOXIL) 500 MG tablet Take 1 tablet (500 mg total) by mouth 3 (three) times daily. 21 tablet Nicholes Hibler K, [place]   diclofenac (VOLTAREN) 75 MG EC tablet Take 1 tablet (75 mg total) by mouth 2 (two) times daily. 20 tablet [name], [place]     PDMP  not reviewed this encounter.  An After Visit Summary was printed and given to the patient.    [name], [place] 05/16/20 1322

## 2020-05-16 NOTE — ED Triage Notes (Signed)
Reports left lower toothache x 5 months with worsening since last night.  States has not been able to afford tooth extraction.  Denies fevers.

## 2020-05-16 NOTE — Discharge Instructions (Signed)
Return if any problems.  Call the dentist on Monday to schedule to be seen

## 2020-06-12 ENCOUNTER — Ambulatory Visit: Payer: Self-pay | Admitting: Physician Assistant

## 2020-06-14 ENCOUNTER — Encounter: Payer: Self-pay | Admitting: Family Medicine

## 2020-06-15 ENCOUNTER — Encounter: Payer: Self-pay | Admitting: Physician Assistant

## 2020-06-15 ENCOUNTER — Ambulatory Visit (INDEPENDENT_AMBULATORY_CARE_PROVIDER_SITE_OTHER): Payer: Self-pay | Admitting: Physician Assistant

## 2020-06-15 ENCOUNTER — Other Ambulatory Visit: Payer: Self-pay

## 2020-06-15 VITALS — BP 126/90 | HR 94 | Temp 97.3°F | Ht 65.0 in | Wt 254.0 lb

## 2020-06-15 DIAGNOSIS — G8929 Other chronic pain: Secondary | ICD-10-CM

## 2020-06-15 DIAGNOSIS — M25562 Pain in left knee: Secondary | ICD-10-CM

## 2020-06-15 NOTE — Telephone Encounter (Signed)
Pt had appointment today.

## 2020-06-15 NOTE — Progress Notes (Signed)
Peggy Schmidt is a 39 y.o. female here for a new problem.  I acted as a Education administrator for [employer], PA-C Anselmo Pickler, LPN   History of Present Illness:   Chief Complaint  Patient presents with  . Knee Pain    HPI    Knee pain Chronic issue. Has been seeing per PCP and also saw Dr. Paulla Fore for this. Xray on 08/2018 was normal MRI on 09/2018 showed IMPRESSION: 1. Abnormal TT TG distance of approximately 20 mm with slight lateral patellar subluxation. 2. Abnormal edema in the superolateral aspect of Hoffa's fat pad anterior to the lateral femoral condyle consistent with impingement on the fat pad. 3. Small areas of cartilage loss on the inferolateral aspect of the patella and on the posterior aspect of the lateral tibial plateau.  She is working a new job where she is constantly on her feet. She is unable to stand for prolonged periods of time due to the pain, swelling. Using a brace with little relief of symptoms. She is also icing and elevating her knee.  Taking advil. Oral diclofenac is not helpful to her. Her last A1c was 10.3%.  Denies: calf pain, SOB, prior injury to knee, ankle pain  Past Medical History:  Diagnosis Date  . Abnormal uterine bleeding   . Anemia   . Asthma   . DM (diabetes mellitus), type 2 ([place])   . Fibroid   . GERD (gastroesophageal reflux disease)   . Hay fever   . Hypertension    pt states her blood pressure has been up and down for the last year  . Post concussion syndrome 2017     Social History   Tobacco Use  . Smoking status: Never Smoker  . Smokeless tobacco: Never Used  Vaping Use  . Vaping Use: Never used  Substance Use Topics  . Alcohol use: No  . Drug use: No    Past Surgical History:  Procedure Laterality Date  . ANKLE FRACTURE SURGERY      Family History  Problem Relation Age of Onset  . Hypertension Mother   . Diabetes Mother   . Depression Mother   . Hyperlipidemia Mother   . Hypertension Brother   .  Hypertension Maternal Grandmother   . Diabetes Father   . Hypertension Father     Allergies  Allergen Reactions  . Other     Black walnuts     Current Medications:   Current Outpatient Medications:  .  Ferrous Sulfate (IRON PO), Take by mouth., Disp: , Rfl:  .  fluticasone (FLONASE) 50 MCG/ACT nasal spray, Place 2 sprays into both nostrils daily., Disp: 16 g, Rfl: 6 .  metFORMIN (GLUCOPHAGE) 1000 MG tablet, Take 1 tablet (1,000 mg total) by mouth 2 (two) times daily with a meal., Disp: 180 tablet, Rfl: 3 .  olmesartan-hydrochlorothiazide (BENICAR HCT) 20-12.5 MG tablet, TAKE ONE TABLET BY MOUTH DAILY, Disp: 30 tablet, Rfl: 10  Current Facility-Administered Medications:  .  medroxyPROGESTERone (DEPO-PROVERA) injection 150 mg, 150 mg, Intramuscular, Q8 Weeks, Anyanwu, Ugonna A, MD, 150 mg at 02/26/20 1430   Review of Systems:   ROS Negative unless otherwise specified per HPI.  Vitals:   Vitals:   06/15/20 1301  BP: 126/90  Pulse: 94  Temp: (!) 97.3 F (36.3 C)  TempSrc: Temporal  SpO2: 97%  Weight: 254 lb (115.2 kg)  Height: 5\' 5"  (1.651 m)     Body mass index is 42.27 kg/m.  Physical Exam:   Physical Exam  Constitutional:      Appearance: She is well-developed.  HENT:     Head: Normocephalic and atraumatic.  Eyes:     Conjunctiva/sclera: Conjunctivae normal.  Pulmonary:     Effort: Pulmonary effort is normal.  Musculoskeletal:        General: Normal range of motion.     Cervical back: Normal range of motion and neck supple.     Comments: Swelling to L lateral knee Slightly darkened skin color to superior and lateral portion of L knee Limited ROM due to pain  Skin:    General: Skin is warm and dry.  Neurological:     Mental Status: She is alert and oriented to person, place, and time.  Psychiatric:        Behavior: Behavior normal.        Thought Content: Thought content normal.        Judgment: Judgment normal.       Assessment and Plan:    Avonelle was seen today for knee pain.  Diagnoses and all orders for this visit:  Chronic pain of left knee -     Ambulatory referral to Sports Medicine   Chronic issue. Uncontrolled and worsening. Referral to Sports Medicine providers at [facility]. Recommended knee sleeve for better support and comfort until she gets to Sports Medicine appointment. Offered Toradol injection but she declined. Unable to offer prednisone due to uncontrolled diabetes. Work note provided for patient to be out of work until she sees sports medicine provider. She states that she has no options at current work for [employer] duty and declined a note today recommending these accommodations.  . Reviewed expectations re: course of current medical issues. . Discussed self-management of symptoms. . Outlined signs and symptoms indicating need for more acute intervention. . Patient verbalized understanding and all questions were answered. . See orders for this visit as documented in the electronic medical record. . Patient received an After-Visit Summary.  CMA or LPN served as scribe during this visit. History, Physical, and Plan performed by medical provider. The above documentation has been reviewed and is accurate and complete.   Inda Coke, PA-C

## 2020-06-15 NOTE — Patient Instructions (Addendum)
Your knee circumference is 40 cm -- please pick up a sleeve is that provides better relief for you.  A referral has been placed for you to see Dr. [name] or Dr. Charlann Boxer with [facility]. Someone from there office will be in touch soon regarding your appointment with him. His location: [place] at [facility] [address] on the 1st floor.   Phone number [phone], Fax [phone].  This location is across the street from the entrance to [employer] and in the same complex as the [facility] and [employer]

## 2020-06-16 ENCOUNTER — Ambulatory Visit: Payer: Self-pay | Admitting: Orthopedic Surgery

## 2020-06-18 ENCOUNTER — Other Ambulatory Visit: Payer: Self-pay

## 2020-06-18 ENCOUNTER — Ambulatory Visit: Payer: Self-pay

## 2020-06-18 ENCOUNTER — Encounter: Payer: Self-pay | Admitting: Family Medicine

## 2020-06-18 ENCOUNTER — Ambulatory Visit (INDEPENDENT_AMBULATORY_CARE_PROVIDER_SITE_OTHER): Payer: Self-pay | Admitting: Family Medicine

## 2020-06-18 VITALS — BP 122/86 | HR 99 | Ht 65.0 in | Wt 255.0 lb

## 2020-06-18 DIAGNOSIS — M25562 Pain in left knee: Secondary | ICD-10-CM

## 2020-06-18 NOTE — Patient Instructions (Addendum)
Thank you for coming in today. Apply for the [facility].  Use over the counter voltaren gel.  If not improving let me know and I can do injection which help some.  I have referred to PT in [place]  Please perform the exercise program that we have prepared for you and gone over in detail on a daily basis.  In addition to the handout you were provided you can access your program through: [email or web address]   Your unique program code is: [email or web address]    Patellofemoral Pain Syndrome  Patellofemoral pain syndrome is a condition in which the tissue (cartilage) on the underside of the kneecap (patella) softens or breaks down. This causes pain in the front of the knee. The condition is also called runner's knee or chondromalacia patella. Patellofemoral pain syndrome is most common in young adults who are active in sports. The knee is the largest joint in the body. The patella covers the front of the knee and is attached to muscles above and below the knee. The underside of the patella is covered with a smooth type of cartilage (synovium). The smooth surface helps the patella to glide easily when you move your knee. Patellofemoral pain syndrome causes swelling in the joint linings and bone surfaces in the knee. What are the causes? This condition may be caused by:  Overuse of the knee.  Poor alignment of your knee joints.  Weak leg muscles.  A direct blow to your kneecap. What increases the risk? You are more likely to develop this condition if:  You do a lot of activities that can wear down your kneecap. These include: ? Running. ? Squatting. ? Climbing stairs.  You start a new physical activity or exercise program.  You wear shoes that do not fit well.  You do not have good leg strength.  You are overweight. What are the signs or symptoms? The main symptom of this condition is knee pain. This may feel like a dull, aching pain underneath your patella, in the front of your  knee. There may be a popping or cracking sound when you move your knee. Pain may get worse with:  Exercise.  Climbing stairs.  Running.  Jumping.  Squatting.  Kneeling.  Sitting for a long time.  Moving or pushing on your patella. How is this diagnosed? This condition may be diagnosed based on:  Your symptoms and medical history. You may be asked about your recent physical activities and which ones cause knee pain.  A physical exam. This may include: ? Moving your patella back and forth. ? Checking your range of knee motion. ? Having you squat or jump to see if you have pain. ? Checking the strength of your leg muscles.  Imaging tests to confirm the diagnosis. These may include an MRI of your knee. How is this treated? This condition may be treated at home with rest, ice, compression, and elevation (RICE).  Other treatments may include:  Nonsteroidal anti-inflammatory drugs (NSAIDs).  Physical therapy to stretch and strengthen your leg muscles.  Shoe inserts (orthotics) to take stress off your knee.  A knee brace or knee support.  Adhesive tapes to the skin.  Surgery to remove damaged cartilage or move the patella to a better position. This is rare. Follow these instructions at home: If you have a shoe or brace:  Wear the shoe or brace as told by your health care provider. Remove it only as told by your health care provider.  Loosen the shoe or  brace if your toes tingle, become numb, or turn cold and blue.  Keep the shoe or brace clean.  If the shoe or brace is not waterproof: ? Do not let it get wet. ? Cover it with a watertight covering when you take a bath or a shower. Managing pain, stiffness, and swelling  If directed, put ice on the painful area. ? If you have a removable shoe or brace, remove it as told by your health care provider. ? Put ice in a plastic bag. ? Place a towel between your skin and the bag. ? Leave the ice on for 20 minutes, 2-3  times a day.  Move your toes often to avoid stiffness and to lessen swelling.  Rest your knee: ? Avoid activities that cause knee pain. ? When sitting or lying down, raise (elevate) the injured area above the level of your heart, whenever possible. General instructions  Take over-the-counter and prescription medicines only as told by your health care provider.  Use splints, braces, knee supports, or walking aids as directed by your health care provider.  Perform stretching and strengthening exercises as told by your health care provider or physical therapist.  Do not use any products that contain nicotine or tobacco, such as cigarettes and e-cigarettes. These can delay healing. If you need help quitting, ask your health care provider.  Return to your normal activities as told by your health care provider. Ask your health care provider what activities are safe for you.  Keep all follow-up visits as told by your health care provider. This is important. Contact a health care provider if:  Your symptoms get worse.  You are not improving with home care. Summary  Patellofemoral pain syndrome is a condition in which the tissue (cartilage) on the underside of the kneecap (patella) softens or breaks down.  This condition causes swelling in the joint linings and bone surfaces in the knee. This leads to pain in the front of the knee.  This condition may be treated at home with rest, ice, compression, and elevation (RICE).  Use splints, braces, knee supports, or walking aids as directed by your health care provider. This information is not intended to replace advice given to you by your health care provider. Make sure you discuss any questions you have with your health care provider. Document Revised: 01/15/2018 Document Reviewed: 01/15/2018 Elsevier Patient Education  2020 [employer].

## 2020-06-18 NOTE — Progress Notes (Signed)
Subjective:    I'm seeing this patient as a consultation for:  Dr. [name] and Len Blalock, PA. Note will be routed back to referring provider/PCP.  CC: L knee pain  I, Molly Weber, LAT, ATC, am serving as scribe for Dr. [name].  HPI: Pt is a 39 y/o female presenting w/ c/o chronic L knee pain that is worsening.  She was previously seen by Dr. Paulla Fore in 2019.  She locates her pain to her L lateral knee.  She notes that her L knee is preventing her from working due to increased pain w/ standing and needing to stand for approximately 8 hours.  L knee swelling: yes L knee mechanical symptoms: yes Aggravating factors: prolonged standing; stairs Treatments tried: knee brace, ice, elevation, Advil and topical Voltaren  Diagnostic testing: L knee MRI- 10/07/18; L knee XR- 09/05/18  Past medical history, Surgical history, Family history, Social history, Allergies, and medications have been entered into the medical record, reviewed.   Review of Systems: No new headache, visual changes, nausea, vomiting, diarrhea, constipation, dizziness, abdominal pain, skin rash, fevers, chills, night sweats, weight loss, swollen lymph nodes, body aches, joint swelling, muscle aches, chest pain, shortness of breath, mood changes, visual or auditory hallucinations.   Objective:    Vitals:   06/18/20 1321  BP: 122/86  Pulse: 99  SpO2: 98%   General: Well Developed, well nourished, and in no acute distress.  Neuro/Psych: Alert and oriented x3, extra-ocular muscles intact, able to move all 4 extremities, sensation grossly intact. Skin: Warm and dry, no rashes noted.  Respiratory: Not using accessory muscles, speaking in full sentences, trachea midline.  Cardiovascular: Pulses palpable, no extremity edema. Abdomen: Does not appear distended. MSK: Left knee mild to moderate effusion otherwise normal-appearing. Tender palpation at lateral aspect of patella and lateral joint line. Range of motion 0-120  degrees with crepitation. Intact strength. Stable ligamentous exam.  Lab and Radiology Results  Diagnostic Limited MSK Ultrasound of: Left knee Quad tendon intact normal-appearing Moderate joint effusion superior patellar space. Patellar tendon normal-appearing Medial joint line slightly narrowed degenerative appearing Lateral joint line moderately narrowed degenerative appearing Impression: Moderate effusion lateral joint line narrowing.   EXAM: MRI OF THE LEFT KNEE WITHOUT CONTRAST  TECHNIQUE: Multiplanar, multisequence MR imaging of the knee was performed. No intravenous contrast was administered.  COMPARISON:  Radiographs dated 09/05/2018  FINDINGS: MENISCI  Medial meniscus:  Normal.  Lateral meniscus:  Normal.  LIGAMENTS  Cruciates:  Normal.  Collaterals:  Normal.  CARTILAGE  Patellofemoral: Small focal area of fissuring of the articular cartilage of the inferolateral aspect of the patella.  Medial:  Normal.  Lateral: Small focal area of full-thickness cartilage loss of the posterior aspect of the lateral tibial plateau.  Joint: Trace joint effusion. Edema in the superolateral aspect of Hoffa's fat pad adjacent to the lower pole of the patella consistent with an impingement phenomenon.  Popliteal Fossa:  No Baker cyst. Intact popliteus tendon.  Extensor Mechanism: Intact quadriceps tendon and patellar tendon. TT TG distance is 20 mm.  Bones:  Normal.  Other: None  IMPRESSION: 1. Abnormal TT TG distance of approximately 20 mm with slight lateral patellar subluxation. 2. Abnormal edema in the superolateral aspect of Hoffa's fat pad anterior to the lateral femoral condyle consistent with impingement on the fat pad. 3. Small areas of cartilage loss on the inferolateral aspect of the patella and on the posterior aspect of the lateral tibial plateau.   Electronically Signed   By:  Lorriane Shire M.D.   On: 10/08/2018  08:12  I, [name], personally (independently) visualized and performed the interpretation of the images attached in this note.   Impression and Recommendations:    Assessment and Plan: 39 y.o. female with left knee pain multifactorial.  Patient does have lateral patellar tracking which probably is contributing a great deal to her pain.  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  We discussed options.  Plan for patellar stabilization with Tru pull lite brace and home exercise program.  Refer also to physical therapy and trial of Voltaren gel.  Offered steroid injection patient declined however that is a backup plan if needed.  Fundamentally however patient does not have health insurance which is going to be a big barrier to her further care.  We will give her application for [facility] charity program which she likely is eligible for.  This will allow physical therapy and further visits with me to be more affordable.  Discussed that the Tru pull lite brace is pretty expensive but she is willing to go for it today which may be helpful.Marland Kitchen  PDMP not reviewed this encounter. Orders Placed This Encounter  Procedures  . [place] LIMITED JOINT SPACE STRUCTURES LOW LEFT(NO LINKED CHARGES)    Order Specific Question:   Reason for Exam (SYMPTOM  OR DIAGNOSIS REQUIRED)    Answer:   L knee pain    Order Specific Question:   Preferred imaging location?    Answer:   [place]  . Ambulatory referral to Physical Therapy    Referral Priority:   Routine    Referral Type:   Physical Medicine    Referral Reason:   Specialty Services Required    Requested Specialty:   Physical Therapy   No orders of the defined types were placed in this encounter.   Discussed warning signs or symptoms. Please see discharge instructions. Patient expresses understanding.   The above documentation has been reviewed and is accurate and complete [name],  M.D.

## 2020-06-25 ENCOUNTER — Telehealth: Payer: Self-pay | Admitting: Obstetrics and Gynecology

## 2020-06-25 ENCOUNTER — Ambulatory Visit: Payer: Self-pay | Admitting: Family Medicine

## 2020-06-25 NOTE — Telephone Encounter (Signed)
Spoke with patient. Patient states that she is still on Depo Provera prescribed by PCP and is doing well. Having intermittent bleeding occasionally when walking or lifting. Patient would like to discuss her surgery options again with Dr.Jertson. Advised patient as we have not seen her since 2019 will need up to date annual exam and can further plan what will be needed to move forward with surgery. Patient is agreeable. Appointment scheduled for 07/02/2020 at 2 pm with Dr.Jertson. Patient agreeable to date and time.  Routing to provider and will close encounter.

## 2020-06-25 NOTE — Telephone Encounter (Signed)
Patient would like to come in for follow up and discuss surgery options. Last seen November 2019.

## 2020-06-30 NOTE — Progress Notes (Deleted)
39 y.o. G0P0000 Single Black or African American Not Hispanic or Latino female here for annual exam.      No LMP recorded. Patient has had an injection.          Sexually active: {yes no:314532}  The current method of family planning is {contraception:315051}.    Exercising: {yes no:314532}  {types:19826} Smoker:  {YES P5382123  Health Maintenance: Pap: 01/13/20 WNL 2019 WNL  History of abnormal Pap:  no TDaP:  2018 Gardasil: none    reports that she has never smoked. She has never used smokeless tobacco. She reports that she does not drink alcohol and does not use drugs.  Past Medical History:  Diagnosis Date  . Abnormal uterine bleeding   . Anemia   . Asthma   . DM (diabetes mellitus), type 2 ([place])   . Fibroid   . GERD (gastroesophageal reflux disease)   . Hay fever   . Hypertension    pt states her blood pressure has been up and down for the last year  . Post concussion syndrome 2017    Past Surgical History:  Procedure Laterality Date  . ANKLE FRACTURE SURGERY      Current Outpatient Medications  Medication Sig Dispense Refill  . Ferrous Sulfate (IRON PO) Take by mouth.    . fluticasone (FLONASE) 50 MCG/ACT nasal spray Place 2 sprays into both nostrils daily. 16 g 6  . metFORMIN (GLUCOPHAGE) 1000 MG tablet Take 1 tablet (1,000 mg total) by mouth 2 (two) times daily with a meal. 180 tablet 3  . olmesartan-hydrochlorothiazide (BENICAR HCT) 20-12.5 MG tablet TAKE ONE TABLET BY MOUTH DAILY 30 tablet 10   Current Facility-Administered Medications  Medication Dose Route Frequency Provider Last Rate Last Admin  . medroxyPROGESTERone (DEPO-PROVERA) injection 150 mg  150 mg Intramuscular Q8 Weeks Anyanwu, Ugonna A, MD   150 mg at 02/26/20 1430    Family History  Problem Relation Age of Onset  . Hypertension Mother   . Diabetes Mother   . Depression Mother   . Hyperlipidemia Mother   . Hypertension Brother   . Hypertension Maternal Grandmother   . Diabetes Father    . Hypertension Father     Review of Systems  Exam:   There were no vitals taken for this visit.  Weight change: @WEIGHTCHANGE @ Height:      Ht Readings from Last 3 Encounters:  06/18/20 5\' 5"  (1.651 m)  06/15/20 5\' 5"  (1.651 m)  05/05/20 5\' 5"  (1.651 m)    General appearance: alert, cooperative and appears stated age Head: Normocephalic, without obvious abnormality, atraumatic Neck: no adenopathy, supple, symmetrical, trachea midline and thyroid {CHL AMB PHY EX THYROID NORM DEFAULT:[id number]::"normal to inspection and palpation"} Lungs: clear to auscultation bilaterally Cardiovascular: regular rate and rhythm Breasts: {Exam; breast:13139::"normal appearance, no masses or tenderness"} Abdomen: soft, non-tender; non distended,  no masses,  no organomegaly Extremities: extremities normal, atraumatic, no cyanosis or edema Skin: Skin color, texture, turgor normal. No rashes or lesions Lymph nodes: Cervical, supraclavicular, and axillary nodes normal. No abnormal inguinal nodes palpated Neurologic: Grossly normal   Pelvic: External genitalia:  no lesions              Urethra:  normal appearing urethra with no masses, tenderness or lesions              Bartholins and Skenes: normal                 Vagina: normal appearing vagina with normal color and  discharge, no lesions              Cervix: {CHL AMB PHY EX CERVIX NORM DEFAULT:[phone]::"no lesions"}               Bimanual Exam:  Uterus:  {CHL AMB PHY EX UTERUS NORM DEFAULT:[phone]::"normal size, contour, position, consistency, mobility, non-tender"}              Adnexa: {CHL AMB PHY EX ADNEXA NO MASS [phone]::"no mass, fullness, tenderness"}               Rectovaginal: Confirms               Anus:  normal sphincter tone, no lesions  *** chaperoned for the exam.  A:  Well Woman with normal exam  P:

## 2020-07-02 ENCOUNTER — Ambulatory Visit: Payer: Self-pay | Admitting: Obstetrics and Gynecology

## 2020-07-03 ENCOUNTER — Ambulatory Visit: Payer: Self-pay | Admitting: Physical Therapy

## 2020-07-07 ENCOUNTER — Ambulatory Visit: Payer: Self-pay

## 2020-07-10 ENCOUNTER — Other Ambulatory Visit: Payer: Self-pay

## 2020-07-10 ENCOUNTER — Ambulatory Visit (INDEPENDENT_AMBULATORY_CARE_PROVIDER_SITE_OTHER): Payer: Self-pay

## 2020-07-10 DIAGNOSIS — N939 Abnormal uterine and vaginal bleeding, unspecified: Secondary | ICD-10-CM

## 2020-07-10 MED ORDER — MEDROXYPROGESTERONE ACETATE 150 MG/ML IM SUSP
150.0000 mg | Freq: Once | INTRAMUSCULAR | Status: AC
  Administered 2020-07-10: 150 mg via INTRAMUSCULAR

## 2020-07-10 NOTE — Progress Notes (Signed)
Per orders of Dr. [name], injection of Depo-Provera given by Serita Sheller in right deltoid. Patient tolerated injection well. Patient will return in around 2 months to receive another injection.

## 2020-07-14 ENCOUNTER — Ambulatory Visit: Payer: Self-pay

## 2020-07-20 ENCOUNTER — Encounter: Payer: Self-pay | Admitting: Physical Therapy

## 2020-07-20 ENCOUNTER — Other Ambulatory Visit: Payer: Self-pay

## 2020-07-20 ENCOUNTER — Ambulatory Visit: Payer: 59 | Attending: Family Medicine | Admitting: Physical Therapy

## 2020-07-20 DIAGNOSIS — M25562 Pain in left knee: Secondary | ICD-10-CM | POA: Diagnosis present

## 2020-07-20 DIAGNOSIS — G8929 Other chronic pain: Secondary | ICD-10-CM | POA: Insufficient documentation

## 2020-07-20 DIAGNOSIS — R600 Localized edema: Secondary | ICD-10-CM | POA: Diagnosis present

## 2020-07-20 NOTE — Therapy (Signed)
[place], [place], [address] Phone: [phone]   Fax:  [phone]  Physical Therapy Evaluation  Patient Details  Name: Peggy Schmidt MRN: [id number] Date of Birth: August 27, 1981 Referring Provider (PT): [name], MD   Encounter Date: 07/20/2020   PT End of Session - 07/20/20 1101    Visit Number 1    Number of Visits 7    Date for PT Re-Evaluation 09/04/20    PT Start Time 1100    PT Stop Time 1139    PT Time Calculation (min) 39 min    Activity Tolerance Patient tolerated treatment well    Behavior During Therapy [facility] for tasks assessed/performed           Past Medical History:  Diagnosis Date  . Abnormal uterine bleeding   . Anemia   . Asthma   . DM (diabetes mellitus), type 2 ([place])   . Fibroid   . GERD (gastroesophageal reflux disease)   . Hay fever   . Hypertension    pt states her blood pressure has been up and down for the last year  . Post concussion syndrome 2017    Past Surgical History:  Procedure Laterality Date  . ANKLE FRACTURE SURGERY      There were no vitals filed for this visit.    Subjective Assessment - 07/20/20 1101    Subjective About a year ago my knee started hurting out of the blue. It is swelling and hurts. I am trying to lose weight. Walks country part 1-2 laps about 3/week. [employer] for gym.    How long can you walk comfortably? fine at home, burning when standing at work for 8 hours    Patient Stated Goals stand for work, exercise    Currently in Pain? No/denies              [facility] PT Assessment - 07/20/20 0001      Assessment   Medical Diagnosis Lt knee pain    Referring Provider (PT) [name], MD    Onset Date/Surgical Date --   about a year ago   Hand Dominance Right    Prior Therapy no      Precautions   Precautions None      Restrictions   Weight Bearing Restrictions No      Balance Screen   Has the patient fallen in the past 6  months No      Home Environment   Additional Comments stars at home      Prior Function   Vocation Requirements 12 hr shifts, all standing      Cognition   Overall Cognitive Status Within Functional Limits for tasks assessed      Observation/Other Assessments   Focus on Therapeutic Outcomes (FOTO)  33% limited      Observation/Other Assessments-Edema    Edema --   Lt knee edema notable superior to patella     Sensation   Additional Comments used to get numb around knee      ROM / Strength   AROM / PROM / Strength Strength      Strength   Overall Strength Comments gross 5/5 bil hip abd 4/5      Palpation   Palpation comment mild TTP around patella                      Objective measurements completed on examination: See above findings.  [facility] Adult PT Treatment/Exercise - 07/20/20 0001      Exercises   Exercises Knee/Hip      Knee/Hip Exercises: Stretches   Passive Hamstring Stretch Limitations seated EOB    Gastroc Stretch Limitations standing lunge      Knee/Hip Exercises: Sidelying   Hip ABduction Limitations arcs    Clams red tband at knees      Manual Therapy   Manual Therapy Taping    McConnell Lt patellar tracking- strip bilaterally                  PT Education - 07/20/20 1311    Education Details anatomy of condition, POC, HEP, exercise form/rationale, FOTO    Person(s) Educated Patient    Methods Explanation;Demonstration;Tactile cues;Verbal cues;Handout    Comprehension Verbalized understanding;Returned demonstration;Verbal cues required;Tactile cues required;Need further instruction            PT Short Term Goals - 07/20/20 1306      PT SHORT TERM GOAL #1   Title pt will use low impact machinery at the gym for cardio in between walking    Baseline using walking as her primary source of exercise at eval    Time 3    Period Weeks    Status New    Target Date 08/10/20             PT Long Term Goals -  07/20/20 1307      PT LONG TERM GOAL #1   Title Pt will have developed an HEP for long term strengthening and weight loss    Baseline will progress as appropriate    Time 6    Period Weeks    Status New    Target Date 09/04/20      PT LONG TERM GOAL #2   Title bil hip abd to 5/5    Baseline 4/5 at eval    Time 6    Period Weeks    Status New    Target Date 09/04/20      PT LONG TERM GOAL #3   Title Pt will tolerate standing for necessary work activities with understanding of proper stretches and exercises during the day    Baseline will continue to establish    Time 6    Period Weeks    Status New    Target Date 09/04/20                  Plan - 07/20/20 1246    Clinical Impression Statement Pt presents to PT with complaints of Lt knee pain that has been on and off for about a year. Good strength with limitations in hip abduction bilaterally. Is returning to work this week where she will be up for 12 hours. usec mcconnell tape to improve patellar tracking and I asked her to wear a solid compression sleve instead of the wrap brace she is using. will benefit from skilled PT in order to address deficits and meet long term goals.    Personal Factors and Comorbidities Time since onset of injury/illness/exacerbation;Profession;Fitness    Examination-Activity Limitations Sit;Bend;Sleep;Carry;Squat;Stairs;Stand;Locomotion Level    Examination-Participation Restrictions Cleaning;Occupation;Shop    Stability/Clinical Decision Making Stable/Uncomplicated    Clinical Decision Making Low    Rehab Potential Good    PT Frequency 1x / week    PT Duration 6 weeks    PT Treatment/Interventions ADLs/Self Care Home Management;Cryotherapy;Electrical Stimulation;Ultrasound;Moist Heat;Iontophoresis 4mg /ml Dexamethasone;Stair training;Functional mobility training;Therapeutic activities;Therapeutic exercise;Neuromuscular re-education;Manual techniques;Patient/family education;Passive range of  motion;Dry  needling;Taping;Vasopneumatic Device    PT Next Visit Plan gross proximal strengthening- core & hips, like the tape? brace?    PT Home Exercise Plan [id number]    Consulted and Agree with Plan of Care Patient           Patient will benefit from skilled therapeutic intervention in order to improve the following deficits and impairments:  Difficulty walking, Decreased activity tolerance, Pain, Improper body mechanics, Decreased strength, Increased edema  Visit Diagnosis: Chronic pain of left knee - Plan: PT plan of care cert/re-cert  Localized edema - Plan: PT plan of care cert/re-cert     Problem List Patient Active Problem List   Diagnosis Date Noted  . DM (diabetes mellitus), type 2 ([place])   . Dyslipidemia 09/21/2018  . Morbid obesity ([place]) 09/21/2018  . Abnormal uterine bleeding (AUB) 06/11/2018  . Microcytic anemia 02/15/2018  . Essential hypertension 02/15/2018  . Chronic pain of left knee 02/15/2018  . History of head injury 02/15/2018  . Fibroid uterus 01/15/2016    Samari Gorby C. Tova Vater PT, DPT 07/20/20 1:13 PM   [place], [place], [address] Phone: [phone]   Fax:  [phone]  Name: Peggy Schmidt MRN: [id number] Date of Birth: 27-Apr-1981

## 2020-07-21 ENCOUNTER — Ambulatory Visit: Payer: Self-pay | Admitting: Obstetrics and Gynecology

## 2020-07-28 ENCOUNTER — Encounter: Payer: Self-pay | Admitting: Physical Therapy

## 2020-07-29 ENCOUNTER — Encounter: Payer: Self-pay | Admitting: Family Medicine

## 2020-07-30 NOTE — Telephone Encounter (Signed)
Form completed patient notified

## 2020-08-05 ENCOUNTER — Other Ambulatory Visit: Payer: Self-pay

## 2020-08-05 ENCOUNTER — Encounter: Payer: Self-pay | Admitting: Physical Therapy

## 2020-08-05 ENCOUNTER — Ambulatory Visit: Payer: 59 | Admitting: Physical Therapy

## 2020-08-05 DIAGNOSIS — R600 Localized edema: Secondary | ICD-10-CM

## 2020-08-05 DIAGNOSIS — M25562 Pain in left knee: Secondary | ICD-10-CM

## 2020-08-05 DIAGNOSIS — G8929 Other chronic pain: Secondary | ICD-10-CM

## 2020-08-05 NOTE — Therapy (Addendum)
[place], [place], [address] Phone: [phone]   Fax:  [phone]  Physical Therapy Treatment/Discharge  Patient Details  Name: Peggy Schmidt MRN: [id number] Date of Birth: 1981/06/14 Referring Provider (PT): [name], MD   Encounter Date: 08/05/2020   PT End of Session - 08/05/20 1540    Visit Number 2    Number of Visits 7    Date for PT Re-Evaluation 09/04/20    PT Start Time 6962    PT Stop Time 1623    PT Time Calculation (min) 43 min    Activity Tolerance Patient tolerated treatment well    Behavior During Therapy [facility] for tasks assessed/performed           Past Medical History:  Diagnosis Date  . Abnormal uterine bleeding   . Anemia   . Asthma   . DM (diabetes mellitus), type 2 ([place])   . Fibroid   . GERD (gastroesophageal reflux disease)   . Hay fever   . Hypertension    pt states her blood pressure has been up and down for the last year  . Post concussion syndrome 2017    Past Surgical History:  Procedure Laterality Date  . ANKLE FRACTURE SURGERY      There were no vitals filed for this visit.   Subjective Assessment - 08/05/20 1543    Subjective Doing cycling at gym which is ok. Tape was helpful. 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    Patient Stated Goals stand for work, exercise    Currently in Pain? No/denies                             [facility] Adult PT Treatment/Exercise - 08/05/20 0001      Pilates   Pilates Reformer foot work 2R1B, bridging all springs, feet in straps 2R & 1R      Knee/Hip Exercises: Aerobic   Recumbent Bike 5 min L4      Knee/Hip Exercises: Machines for Strengthening   Cybex Knee Extension 35lb    Cybex Knee Flexion 35lb    Cybex Leg Press 65lb doubel & single leg      Manual Therapy   Manual Therapy Taping    Kinesiotex Edema      Kinesiotix   Edema Lt knee- single fan                     PT Short Term Goals - 07/20/20 1306      PT SHORT TERM GOAL #1   Title pt will use low impact machinery at the gym for cardio in between walking    Baseline using walking as her primary source of exercise at eval    Time 3    Period Weeks    Status New    Target Date 08/10/20             PT Long Term Goals - 07/20/20 1307      PT LONG TERM GOAL #1   Title Pt will have developed an HEP for long term strengthening and weight loss    Baseline will progress as appropriate    Time 6    Period Weeks    Status New    Target Date 09/04/20      PT LONG TERM GOAL #2   Title bil hip abd to 5/5    Baseline 4/5 at  eval    Time 6    Period Weeks    Status New    Target Date 09/04/20      PT LONG TERM GOAL #3   Title Pt will tolerate standing for necessary work activities with understanding of proper stretches and exercises during the day    Baseline will continue to establish    Time 6    Period Weeks    Status New    Target Date 09/04/20                 Plan - 08/05/20 1625    Clinical Impression Statement used gym machinery for education on performance/posture as well as Research officer, political party which pt enjoyed. Placed K-tape for edema on Lt knee to reduce edema.    PT Treatment/Interventions ADLs/Self Care Home Management;Cryotherapy;Electrical Stimulation;Ultrasound;Moist Heat;Iontophoresis 4mg /ml Dexamethasone;Stair training;Functional mobility training;Therapeutic activities;Therapeutic exercise;Neuromuscular re-education;Manual techniques;Patient/family education;Passive range of motion;Dry needling;Taping;Vasopneumatic Device    PT Next Visit Plan outcome of edema tape? continue reformer for strengthening    PT [place] [id number]    Consulted and Agree with Plan of Care Patient           Patient will benefit from skilled therapeutic intervention in order to improve the following deficits and impairments:  Difficulty walking, Decreased activity tolerance, Pain,  Improper body mechanics, Decreased strength, Increased edema  Visit Diagnosis: Chronic pain of left knee  Localized edema     Problem List Patient Active Problem List   Diagnosis Date Noted  . DM (diabetes mellitus), type 2 ([place])   . Dyslipidemia 09/21/2018  . Morbid obesity ([place]) 09/21/2018  . Abnormal uterine bleeding (AUB) 06/11/2018  . Microcytic anemia 02/15/2018  . Essential hypertension 02/15/2018  . Chronic pain of left knee 02/15/2018  . History of head injury 02/15/2018  . Fibroid uterus 01/15/2016    Jayleene Glaeser C. Mallorey Odonell PT, DPT 08/05/20 4:27 PM   [place] [facility] [address] [place], [place], [address] Phone: [phone]   Fax:  [phone]  Name: Peggy Schmidt MRN: [id number] Date of Birth: [birth year] PHYSICAL THERAPY DISCHARGE SUMMARY  Visits from Start of Care: 2  Current functional level related to goals / functional outcomes: See above   Remaining deficits: See above   Education / Equipment: Anatomy of condition, POC, HEP, exercise form/rationale  Plan: Patient agrees to discharge.  Patient goals were not met. Patient is being discharged due to not returning since the last visit.  ?????     Zackaria Burkey C. Sharisse Rantz PT, DPT 10/19/20 2:37 PM

## 2020-08-06 ENCOUNTER — Encounter: Payer: Self-pay | Admitting: Family Medicine

## 2020-08-06 ENCOUNTER — Telehealth: Payer: Self-pay | Admitting: Family Medicine

## 2020-08-06 NOTE — Telephone Encounter (Signed)
Called pt and informed her that her updated work note is available for pick-up at the office.

## 2020-08-06 NOTE — Telephone Encounter (Signed)
Letter signed and ready for pickup.  Copy of letter also sent electronically through my chart.

## 2020-08-06 NOTE — Telephone Encounter (Signed)
Patient called stating that she is needing a note allowing her to return to work without restrictions.  If this is able to be done, she would like to come by the office to pick up a copy.

## 2020-08-11 ENCOUNTER — Encounter: Payer: Self-pay | Admitting: Family Medicine

## 2020-08-11 ENCOUNTER — Other Ambulatory Visit: Payer: Self-pay

## 2020-08-11 ENCOUNTER — Ambulatory Visit (INDEPENDENT_AMBULATORY_CARE_PROVIDER_SITE_OTHER): Payer: 59 | Admitting: Family Medicine

## 2020-08-11 ENCOUNTER — Ambulatory Visit: Payer: 59 | Admitting: Physical Therapy

## 2020-08-11 VITALS — BP 126/92 | HR 94 | Temp 97.6°F | Ht 65.0 in | Wt 252.6 lb

## 2020-08-11 DIAGNOSIS — E119 Type 2 diabetes mellitus without complications: Secondary | ICD-10-CM | POA: Diagnosis not present

## 2020-08-11 LAB — POCT GLYCOSYLATED HEMOGLOBIN (HGB A1C): Hemoglobin A1C: 11.7 % — AB (ref 4.0–5.6)

## 2020-08-11 LAB — GLUCOSE, POCT (MANUAL RESULT ENTRY): POC Glucose: 140 mg/dl — AB (ref 70–99)

## 2020-08-11 MED ORDER — OZEMPIC (0.25 OR 0.5 MG/DOSE) 2 MG/1.5ML ~~LOC~~ SOPN: 0.5000 mg | PEN_INJECTOR | SUBCUTANEOUS | 11 refills | Status: DC

## 2020-08-11 NOTE — Assessment & Plan Note (Signed)
A1c elevated at 11.7. Patient requested letter stating that it is okay for her to perform job duties that include wearing a respirator and performing strenuous activity.  I see no reason why her glycemic control would preclude her from this.  Letter was provided today.  We will continue Metformin but decrease dose to 1000 mg daily as she is having some GI side effects with the higher dose.  We will start Ozempic 0.25 mg weekly for 4 weeks and then increase to 0.5 mg weekly.  Discussed potential side effects.  She will check in with me in about a month. Repeat A1c in 3 months.

## 2020-08-11 NOTE — Patient Instructions (Signed)
It was very nice to see you today!  Please start the ozempic.  Send me a message in 4 weeks to let me know how it is working for you.  Come back in 3 months to recheck your blood sugar.   Take care, Dr [name]  Please try these tips to maintain a healthy lifestyle:   Eat at least 3 REAL meals and 1-2 snacks per day.  Aim for no more than 5 hours between eating.  If you eat breakfast, please do so within one hour of getting up.    Each meal should contain half fruits/vegetables, one quarter protein, and one quarter carbs (no bigger than a computer mouse)   Cut down on sweet beverages. This includes juice, soda, and sweet tea.     Drink at least 1 glass of water with each meal and aim for at least 8 glasses per day   Exercise at least 150 minutes every week.

## 2020-08-11 NOTE — Progress Notes (Signed)
   Peggy Schmidt is a 39 y.o. female who presents today for an office visit.  Assessment/Plan:  Chronic Problems Addressed Today: DM (diabetes mellitus), type 2 ([facility]) A1c elevated at 11.7. Patient requested letter stating that it is okay for her to perform job duties that include wearing a respirator and performing strenuous activity.  I see no reason why her glycemic control would preclude her from this.  Letter was provided today.  We will continue Metformin but decrease dose to 1000 mg daily as she is having some GI side effects with the higher dose.  We will start Ozempic 0.25 mg weekly for 4 weeks and then increase to 0.5 mg weekly.  Discussed potential side effects.  She will check in with me in about a month. Repeat A1c in 3 months.     Subjective:  HPI:  See A/p.         Objective:  Physical Exam: BP (!) 126/92   Pulse 94   Temp 97.6 F (36.4 C) (Temporal)   Ht 5\' 5"  (1.651 m)   Wt 252 lb 9.6 oz (114.6 kg)   SpO2 97%   BMI 42.03 kg/m   Gen: No acute distress, resting comfortably Neuro: Grossly normal, moves all extremities Psych: Normal affect and thought content      Yaelis Scharfenberg M. [name], MD 08/11/2020 3:58 PM

## 2020-08-18 ENCOUNTER — Ambulatory Visit: Payer: 59 | Admitting: Physical Therapy

## 2020-08-25 ENCOUNTER — Encounter: Payer: 59 | Admitting: Physical Therapy

## 2020-09-01 ENCOUNTER — Encounter: Payer: 59 | Admitting: Physical Therapy

## 2020-09-08 ENCOUNTER — Encounter: Payer: 59 | Admitting: Physical Therapy

## 2020-09-09 ENCOUNTER — Ambulatory Visit (INDEPENDENT_AMBULATORY_CARE_PROVIDER_SITE_OTHER): Payer: 59 | Admitting: *Deleted

## 2020-09-09 ENCOUNTER — Other Ambulatory Visit: Payer: Self-pay

## 2020-09-09 DIAGNOSIS — Z3009 Encounter for other general counseling and advice on contraception: Secondary | ICD-10-CM

## 2020-09-09 MED ORDER — MEDROXYPROGESTERONE ACETATE 150 MG/ML IM SUSP
150.0000 mg | Freq: Once | INTRAMUSCULAR | Status: AC
  Administered 2020-09-09: 150 mg via INTRAMUSCULAR

## 2020-09-09 NOTE — Progress Notes (Signed)
Patient present for Depo-Provera Inj Given Rt deltoid  Pt tolerated well  Advise to return between Dec 8-Dec22

## 2020-09-15 ENCOUNTER — Encounter: Payer: 59 | Admitting: Physical Therapy

## 2020-09-16 ENCOUNTER — Ambulatory Visit: Payer: 59 | Attending: Family Medicine | Admitting: Physical Therapy

## 2020-09-16 ENCOUNTER — Telehealth: Payer: Self-pay | Admitting: Physical Therapy

## 2020-09-16 NOTE — Telephone Encounter (Signed)
LVM regarding NS and advised of next appointment time. Requested call back with questions or need to RS. Lamira Borin C. Deangleo Passage PT, DPT 09/16/20 5:11 PM

## 2020-09-21 ENCOUNTER — Encounter: Payer: Self-pay | Admitting: Family Medicine

## 2020-09-21 ENCOUNTER — Telehealth (INDEPENDENT_AMBULATORY_CARE_PROVIDER_SITE_OTHER): Payer: 59 | Admitting: Family Medicine

## 2020-09-21 VITALS — Ht 65.0 in

## 2020-09-21 DIAGNOSIS — R059 Cough, unspecified: Secondary | ICD-10-CM

## 2020-09-21 MED ORDER — AZELASTINE HCL 0.1 % NA SOLN: 2.0000 | Freq: Two times a day (BID) | NASAL | 12 refills | Status: DC

## 2020-09-21 MED ORDER — BENZONATATE 200 MG PO CAPS: 200.0000 mg | ORAL_CAPSULE | Freq: Three times a day (TID) | ORAL | 0 refills | Status: DC | PRN

## 2020-09-21 MED ORDER — AZITHROMYCIN 250 MG PO TABS: ORAL_TABLET | ORAL | 0 refills | Status: DC

## 2020-09-21 NOTE — Progress Notes (Signed)
   Peggy Schmidt is a 39 y.o. female who presents today for a virtual office visit.  Assessment/Plan:  Cough Will be getting tested for Covid later today.  No red flags.  Will send in prescription for Tessalon and Astelin.  Also send in "pocket prescription" for azithromycin with instruction not start unless symptoms do not improve the next few days.  Continue good oral hydration.  Discussed reasons return to care.  Follow-up as needed.    Subjective:  HPI:  Patient with 3 days of URI symptoms including nasal congestion, cough, and body aches. Symptoms seem to be improving slightly. No fevers or chills. No nausea or vomiting. 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 Will be getting tested for covid this afternoon.        Objective/Observations  Physical Exam: Gen: NAD, resting comfortably Pulm: Normal work of breathing Neuro: Grossly normal, moves all extremities Psych: Normal affect and thought content  Virtual Visit via Video   I connected with Peggy Schmidt on 09/21/20 at 11:00 AM EDT by a video enabled telemedicine application and verified that I am speaking with the correct person using two identifiers. The limitations of evaluation and management by telemedicine and the availability of in person appointments were discussed. The patient expressed understanding and agreed to proceed.   Patient location: Home Provider location: [place] participating in the virtual visit: Myself and Patient     Algis Greenhouse. [name], MD 09/21/2020 10:52 AM

## 2020-09-22 ENCOUNTER — Encounter: Payer: Self-pay | Admitting: Family Medicine

## 2020-09-22 ENCOUNTER — Ambulatory Visit: Payer: 59 | Admitting: Physical Therapy

## 2020-09-23 ENCOUNTER — Other Ambulatory Visit: Payer: 59

## 2020-09-23 DIAGNOSIS — Z20822 Contact with and (suspected) exposure to covid-19: Secondary | ICD-10-CM

## 2020-09-24 ENCOUNTER — Encounter: Payer: Self-pay | Admitting: Family Medicine

## 2020-09-24 LAB — NOVEL CORONAVIRUS, NAA: SARS-CoV-2, NAA: NOT DETECTED

## 2020-09-24 LAB — SARS-COV-2, NAA 2 DAY TAT

## 2020-09-25 ENCOUNTER — Encounter: Payer: 59 | Admitting: Family Medicine

## 2020-09-28 ENCOUNTER — Telehealth: Payer: Self-pay

## 2020-09-28 ENCOUNTER — Encounter: Payer: Self-pay | Admitting: Family Medicine

## 2020-09-28 NOTE — Telephone Encounter (Signed)
Pt called asking if it was okay for her to take magnesium vitamins. Pt states the label said to contact provider if she has diabetes or hypertension. Pt asked if you would send her a message on MyChart. Please advise.

## 2020-09-28 NOTE — Telephone Encounter (Signed)
Ok for her to take as directed on product label.  Algis Greenhouse. [name], MD 09/28/2020 12:52 PM

## 2020-09-28 NOTE — Telephone Encounter (Signed)
See below

## 2020-09-28 NOTE — Telephone Encounter (Signed)
Send information via Mychart to patient

## 2020-09-29 ENCOUNTER — Ambulatory Visit: Payer: 59 | Attending: Family Medicine | Admitting: Physical Therapy

## 2020-10-01 ENCOUNTER — Ambulatory Visit: Payer: 59

## 2020-10-06 ENCOUNTER — Ambulatory Visit (INDEPENDENT_AMBULATORY_CARE_PROVIDER_SITE_OTHER): Payer: 59

## 2020-10-06 ENCOUNTER — Other Ambulatory Visit: Payer: Self-pay

## 2020-10-06 DIAGNOSIS — Z23 Encounter for immunization: Secondary | ICD-10-CM

## 2020-10-06 NOTE — Progress Notes (Signed)
Peggy Schmidt 39 y.o. female presents to office today for tetanus vaccine per Dimas Chyle, MD. Administered TDAP 0. ML IM left arm. Patient tolerated well. Immunization documented on [facility].

## 2020-10-09 ENCOUNTER — Ambulatory Visit: Payer: Self-pay | Admitting: Obstetrics and Gynecology

## 2020-10-15 DIAGNOSIS — Z1231 Encounter for screening mammogram for malignant neoplasm of breast: Secondary | ICD-10-CM

## 2020-10-16 ENCOUNTER — Ambulatory Visit: Payer: Self-pay | Admitting: Obstetrics and Gynecology

## 2020-10-27 ENCOUNTER — Telehealth (INDEPENDENT_AMBULATORY_CARE_PROVIDER_SITE_OTHER): Payer: 59 | Admitting: Family Medicine

## 2020-10-27 ENCOUNTER — Other Ambulatory Visit: Payer: Self-pay

## 2020-10-27 DIAGNOSIS — L03032 Cellulitis of left toe: Secondary | ICD-10-CM | POA: Diagnosis not present

## 2020-10-27 DIAGNOSIS — M79675 Pain in left toe(s): Secondary | ICD-10-CM | POA: Diagnosis not present

## 2020-10-27 MED ORDER — MUPIROCIN 2 % EX OINT: 1.0000 "application " | TOPICAL_OINTMENT | Freq: Two times a day (BID) | CUTANEOUS | 0 refills | Status: DC

## 2020-10-27 NOTE — Progress Notes (Signed)
Virtual Visit via Telephone Note  I connected with [name] on 10/27/20 at  4:20 PM EST by telephone and verified that I am speaking with the correct person using two identifiers.   I discussed the limitations, risks, security and privacy concerns of performing an evaluation and management service by telephone and the availability of in person appointments. I also discussed with the patient that there may be a patient responsible charge related to this service. The patient expressed understanding and agreed to proceed.  Location patient: home, [place] Location provider: work or home office Participants present for the call: patient, provider Patient did not have a visit with me in the prior 7 days to address this/these issue(s).   History of Present Illness:  Acute telemedicine visit for L great toe: -Onset: 2 days ago -Symptoms include: ingrown toenail, some redness and pain around the toenail only - no purulent or appreciable fluctuance -Denies: fever, malaise, purulence, spreading of redness or pain -Has tried: alcohol -Pertinent past medical history: has had similar issue in the past -Pertinent medication allergies:nkda per patient    Observations/Objective: Patient sounds cheerful and well on the phone. I do not appreciate any SOB. Speech and thought processing are grossly intact. She reports the erythema and mild swelling and tenderness is only around the nail, she does not see any pus, nor does she feel there is pus beneath the skin on her exam Patient reported vitals:denies fevers  Assessment and Plan:  Pain of toe of left foot  Paronychia of great toe, left  -we discussed possible serious and likely etiologies, options for evaluation and workup, limitations of telemedicine visit vs in person visit, treatment, treatment risks and precautions. Pt prefers to treat via telemedicine empirically rather than in person at this moment.  Suspicious suspect possible paronychia.   She opted to try empiric treatments with salt water soaks twice daily, keeping clean and dry and topical antibiotic ointment.  Sent Rx for mupirocin ointment. Scheduled follow up with PCP offered: Agrees to follow-up if needed. Advised to seek prompt in person care if worsening, new symptoms arise, or if is not improving with treatment over the next 1 to 2 days. Advised of options for inperson care in case PCP office not available. Did let the patient know that I only do telemedicine shifts for [facility] on Tuesdays and Thursdays and advised a follow up visit with PCP or at an [facility] if has further questions or concerns.   Follow Up Instructions:  I did not refer this patient for an OV with me in the next 24 hours for this/these issue(s).  I discussed the assessment and treatment plan with the patient. The patient was provided an opportunity to ask questions and all were answered. The patient agreed with the plan and demonstrated an understanding of the instructions.   I spent  16 minutes on this encounter.   Lucretia Kern, DO

## 2020-10-27 NOTE — Patient Instructions (Signed)
-  I sent the medication(s) we discussed to your pharmacy: Meds ordered this encounter  Medications  . mupirocin ointment (BACTROBAN) 2 %    Sig: Apply 1 application topically 2 (two) times daily.    Dispense:  22 g    Refill:  0   Warm salt water soaks twice daily -then dry thoroughly and apply the ointment.  I hope you are feeling better soon!  Seek in person care promptly if your symptoms worsen, new concerns arise or you are not improving with treatment over the next 1 to 2 days.  It was nice to meet you today. I help [facility] out with telemedicine visits on Tuesdays and Thursdays and am available for visits on those days. If you have any concerns or questions following this visit please schedule a follow up visit with your Primary Care doctor or seek care at a local urgent care clinic to avoid delays in care.

## 2020-10-28 ENCOUNTER — Ambulatory Visit: Payer: 59 | Admitting: Obstetrics & Gynecology

## 2020-10-29 ENCOUNTER — Encounter: Payer: Self-pay | Admitting: Family Medicine

## 2020-10-30 ENCOUNTER — Encounter: Payer: Self-pay | Admitting: Family Medicine

## 2020-10-30 ENCOUNTER — Ambulatory Visit (INDEPENDENT_AMBULATORY_CARE_PROVIDER_SITE_OTHER): Payer: 59 | Admitting: Family Medicine

## 2020-10-30 ENCOUNTER — Other Ambulatory Visit: Payer: Self-pay

## 2020-10-30 VITALS — BP 115/78 | HR 93 | Temp 98.0°F | Ht 65.0 in | Wt 255.8 lb

## 2020-10-30 DIAGNOSIS — E119 Type 2 diabetes mellitus without complications: Secondary | ICD-10-CM | POA: Diagnosis not present

## 2020-10-30 DIAGNOSIS — I10 Essential (primary) hypertension: Secondary | ICD-10-CM | POA: Diagnosis not present

## 2020-10-30 DIAGNOSIS — L03039 Cellulitis of unspecified toe: Secondary | ICD-10-CM | POA: Diagnosis not present

## 2020-10-30 MED ORDER — OLMESARTAN MEDOXOMIL-HCTZ 20-12.5 MG PO TABS: 0.5000 | ORAL_TABLET | Freq: Every day | ORAL | 10 refills | Status: DC

## 2020-10-30 NOTE — Progress Notes (Signed)
   Peggy Schmidt is a 39 y.o. female who presents today for an office visit.  Assessment/Plan:  New/Acute Problems: Paronychia I&D performed today.  See below procedure note. She tolerated well.  Discussed care after.  Chronic Problems Addressed Today: Essential hypertension Well-controlled today.  She would like to wean off some of her medications.  We will go to half a tablet of olmesartan-HCTZ.  Advised her tomonitor blood pressure at home and let us know if persistently 140/90 or higher.   DM (diabetes mellitus), type 2 ([place]) Too early to recheck A1c.  We will continue Metformin 1000 mg twice daily.  Recheck A1c at next office visit.     Subjective:  HPI:  Patient here with infected great toe.  Started a few daysago.  Had virtual visit with different provider and was prescribed mupirocin.  Symptoms have not improved.  Located on left great toe.  See A/P for status of chronic conditions.       Objective:  Physical Exam: BP 115/78   Pulse 93   Temp 98 F (36.7 C) (Temporal)   Ht 5\' 5"  (1.651 m)   Wt 255 lb 12.8 oz (116 kg)   SpO2 98%   BMI 42.57 kg/m   Gen: No acute distress, resting comfortably MSK: Paronychia on medial fold of left great toenail. Neuro: Grossly normal, moves all extremities Psych: Normal affect and thought content  Incision and Drainage Procedure Note  Pre-operative Diagnosis: Paronychia  Post-operative Diagnosis: same  Indications: Therapeutic  Anesthesia: ethyl chloride spray  Procedure Details  The procedure, risks and complications have been discussed in detail (including, but not limited to airway compromise, infection, bleeding) with the patient, and the patient has signed consent to the procedure.  The skin was sterilely prepped and draped over the affected area in the usual fashion. After adequate local anesthesia, I&D with a #11 blade was performed on the left great toe. Purulent drainage: present The patient was observed until  stable.  Findings: Paronychia  EBL: 1 cc's  Drains: N/A  Condition: Tolerated procedure well   Complications: none.        Algis Greenhouse. [name], MD 10/30/2020 2:35 PM

## 2020-10-30 NOTE — Assessment & Plan Note (Signed)
Too early to recheck A1c.  We will continue Metformin 1000 mg twice daily.  Recheck A1c at next office visit.

## 2020-10-30 NOTE — Patient Instructions (Signed)
It was very nice to see you today!  We drained your infection today.  Please continue using the antibiotic ointment and using Epson salt soaks over the next few days.  Let me know if not improving.  It is okay for you to cut your blood pressure pill in half.  Please keep an eye on it and let me know if it is persistently 140/90 or higher.  I would like to see you back in a few months to recheck your blood sugar.  Please come back to see me sooner if needed.  Take care, Dr [name]  Please try these tips to maintain a healthy lifestyle:   Eat at least 3 REAL meals and 1-2 snacks per day.  Aim for no more than 5 hours between eating.  If you eat breakfast, please do so within one hour of getting up.    Each meal should contain half fruits/vegetables, one quarter protein, and one quarter carbs (no bigger than a computer mouse)   Cut down on sweet beverages. This includes juice, soda, and sweet tea.     Drink at least 1 glass of water with each meal and aim for at least 8 glasses per day   Exercise at least 150 minutes every week.

## 2020-10-30 NOTE — Assessment & Plan Note (Signed)
Well-controlled today.  She would like to wean off some of her medications.  We will go to half a tablet of olmesartan-HCTZ.  Advised her tomonitor blood pressure at home and let us know if persistently 140/90 or higher.

## 2020-11-04 ENCOUNTER — Other Ambulatory Visit: Payer: Self-pay | Admitting: Family Medicine

## 2020-11-05 ENCOUNTER — Ambulatory Visit: Payer: 59 | Admitting: Family Medicine

## 2020-11-05 NOTE — Progress Notes (Deleted)
   I, Peterson Lombard, LAT, ATC acting as a scribe for [name], MD.  Peggy Schmidt is a 39 y.o. 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 Pt was last seen by Dr. Georgina Snell on 06/18/20 and was advised to wear a patellar stabilization with Tru pull lite brace, HEP, Voltaren gel and was referred to PT of which she's completed 2 visits. Pt was offered steroid injection however, declined. Today, pt reports  Diagnostic imaging: L knee MRI- 10/07/18; L knee XR- 09/05/18  Pertinent review of systems: ***  Relevant historical information: ***   Exam:  There were no vitals taken for this visit. General: Well Developed, well nourished, and in no acute distress.   MSK: ***    Lab and Radiology Results No results found for this or any previous visit (from the past 72 hour(s)). No results found.     Assessment and Plan: 39 y.o. female with ***   PDMP not reviewed this encounter. No orders of the defined types were placed in this encounter.  No orders of the defined types were placed in this encounter.    Discussed warning signs or symptoms. Please see discharge instructions. Patient expresses understanding.   ***

## 2020-11-06 ENCOUNTER — Ambulatory Visit: Payer: 59 | Admitting: Podiatry

## 2020-11-16 NOTE — Progress Notes (Signed)
I, Peggy Schmidt, LAT, ATC, am serving as scribe for Dr. [name].  BIANA Schmidt is a 39 y.o. female who presents to [facility] at [facility] today for f/u of knee pain.  She was last seen by Dr. Georgina Snell on 06/18/20 for her L knee and was provided a HEP focusing on quad and hip strengthening.  She was also referred to PT and completed 2 visits.  She also purchased a TruPull knee brace.  Since her last visit with Dr. Georgina Snell, the pt reports both knees are now bothering her. Swelling is present along the anterior aspect of L knee. Pt c/o barely being able to walk. R knee along the lateral aspect on the R knee. 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 Voltaren gel, knee braces, ice, heat, Epsom salt.  Diagnostic testing: L knee MRI- 10/07/18; L knee XR- 09/05/18  Pertinent review of systems: No fevers or chills  Relevant historical information: Obesity and poorly controlled diabetes   Exam:  BP (!) 136/92 (BP Location: Left Arm, Patient Position: Sitting, Cuff Size: Normal)   Pulse 93   Ht 5\' 5"  (1.651 m)   Wt 255 lb 6.4 oz (115.8 kg)   SpO2 97%   BMI 42.50 kg/m  General: Well Developed, well nourished, and in no acute distress.   MSK: Knees bilaterally without effusion.  Normal motion with crepitation.  Intact strength.  Stable ligamentous exam.    Lab and Radiology Results X-ray images bilateral knees obtained today personally and independently interpreted  Right knee: Mild medial compartment and patellofemoral compartment DJD.  No acute fractures.  Left knee: Mild medial compartment DJD.  No acute fractures.  Await formal radiology review    Assessment and Plan: 39 y.o. female with bilateral knee pain.  Previous knee pain thought to be due to patellar tracking and some degenerative changes.  However she has more pain that I would expect based on  changes seen on MRI and x-ray.  We discussed options.  Plan for rheumatologic work-up listed below and updated x-rays.  Discussed also options including steroid injection or hyaluronic acid injection.  Given her poorly controlled diabetes would recommend against cortisone/steroid injections for now.  We will work on authorizing hyaluronic acid injections.  Additionally discussed weight as a cause of her knee pain.  Weight loss should help quite a bit.  Mentioned Ozempic.  This may be helpful at both her diabetes and prompted weight loss.  Recommend contact PCP to discuss this further.   PDMP not reviewed this encounter. Orders Placed This Encounter  Procedures  . DG Knee AP/LAT W/Sunrise Left    Standing Status:   Future    Number of Occurrences:   1    Standing Expiration Date:   11/17/2021    Order Specific Question:   Reason for Exam (SYMPTOM  OR DIAGNOSIS REQUIRED)    Answer:   chronic bilateral knee pain    Order Specific Question:   Preferred imaging location?    Answer:   [facility]    Order Specific Question:   Is patient pregnant?    Answer:   No  . DG Knee AP/LAT W/Sunrise Right    Standing Status:   Future    Number of Occurrences:   1    Standing Expiration Date:   11/17/2021    Order Specific Question:  Reason for Exam (SYMPTOM  OR DIAGNOSIS REQUIRED)    Answer:   chronic bilateral knee pain    Order Specific Question:   Preferred imaging location?    Answer:   [facility]    Order Specific Question:   Is patient pregnant?    Answer:   No  . Sedimentation rate    Standing Status:   Future    Number of Occurrences:   1    Standing Expiration Date:   11/17/2021  . Rheumatoid factor    Standing Status:   Future    Number of Occurrences:   1    Standing Expiration Date:   11/17/2021  . CK    Standing Status:   Future    Number of Occurrences:   1    Standing Expiration Date:   11/17/2021  . Uric acid    Standing Status:   Future    Number of  Occurrences:   1    Standing Expiration Date:   11/17/2021  . Cyclic citrul peptide antibody, IgG    Standing Status:   Future    Number of Occurrences:   1    Standing Expiration Date:   11/17/2021  . ANA    Standing Status:   Future    Number of Occurrences:   1    Standing Expiration Date:   11/17/2021   No orders of the defined types were placed in this encounter.    Discussed warning signs or symptoms. Please see discharge instructions. Patient expresses understanding.   The above documentation has been reviewed and is accurate and complete [name], M.D.

## 2020-11-17 ENCOUNTER — Encounter: Payer: Self-pay | Admitting: Family Medicine

## 2020-11-17 ENCOUNTER — Other Ambulatory Visit: Payer: Self-pay

## 2020-11-17 ENCOUNTER — Ambulatory Visit (INDEPENDENT_AMBULATORY_CARE_PROVIDER_SITE_OTHER): Payer: 59 | Admitting: Family Medicine

## 2020-11-17 ENCOUNTER — Ambulatory Visit (INDEPENDENT_AMBULATORY_CARE_PROVIDER_SITE_OTHER): Payer: 59

## 2020-11-17 ENCOUNTER — Telehealth: Payer: Self-pay

## 2020-11-17 ENCOUNTER — Ambulatory Visit: Payer: Self-pay

## 2020-11-17 VITALS — BP 136/92 | HR 93 | Ht 65.0 in | Wt 255.4 lb

## 2020-11-17 DIAGNOSIS — M25561 Pain in right knee: Secondary | ICD-10-CM

## 2020-11-17 DIAGNOSIS — G8929 Other chronic pain: Secondary | ICD-10-CM

## 2020-11-17 DIAGNOSIS — M25562 Pain in left knee: Secondary | ICD-10-CM | POA: Diagnosis not present

## 2020-11-17 DIAGNOSIS — E1169 Type 2 diabetes mellitus with other specified complication: Secondary | ICD-10-CM

## 2020-11-17 LAB — CK: Total CK: 177 U/L (ref 7–177)

## 2020-11-17 LAB — URIC ACID: Uric Acid, Serum: 6 mg/dL (ref 2.4–7.0)

## 2020-11-17 LAB — SEDIMENTATION RATE: Sed Rate: 17 mm/hr (ref 0–20)

## 2020-11-17 MED ORDER — OZEMPIC (0.25 OR 0.5 MG/DOSE) 2 MG/1.5ML ~~LOC~~ SOPN: PEN_INJECTOR | SUBCUTANEOUS | 3 refills | Status: DC

## 2020-11-17 NOTE — Telephone Encounter (Signed)
Rx sent in

## 2020-11-17 NOTE — Patient Instructions (Addendum)
Thank you for coming in today.  For knees get xrays today and get labs today.   Keep working on those leg exercises.   Weight loss will help a lot.   Ask Dr [name] about Ozempic. That I think will help a lot with weight and diabetes.   I can do cortisone or gel shots at any time.   Use the voltaren gel.

## 2020-11-17 NOTE — Progress Notes (Signed)
Right knee x-ray looks normal to radiology

## 2020-11-17 NOTE — Progress Notes (Signed)
Left knee x-ray shows mild arthritis.

## 2020-11-17 NOTE — Telephone Encounter (Signed)
  LAST APPOINTMENT DATE: 11/04/2020   NEXT APPOINTMENT DATE:@12 /17/2021  MEDICATION: Ozempic Pen    PHARMACY: [facility] [address], [place]  Comments: Patient called in saying she threw away the other one and is wanting a new prescription.,  Please advise

## 2020-11-18 NOTE — Progress Notes (Signed)
Uric acid lab for gout is normal.  Sedimentation rate a marker of inflammation is normal and CK a marker of muscle inflammation is normal.  Other labs are still pending.

## 2020-11-19 ENCOUNTER — Telehealth: Payer: Self-pay | Admitting: Family Medicine

## 2020-11-19 LAB — RHEUMATOID FACTOR: Rheumatoid fact SerPl-aCnc: <14 IU/mL (ref <14)

## 2020-11-19 LAB — ANA: Anti Nuclear Antibody (ANA): NEGATIVE

## 2020-11-19 LAB — CYCLIC CITRUL PEPTIDE ANTIBODY, IGG: Cyclic Citrullin Peptide Ab: <16 UNITS

## 2020-11-19 NOTE — Progress Notes (Signed)
Further labs are negative. It appears that you do not have a rheumatologic disease to explain your knee pain.

## 2020-11-19 NOTE — Telephone Encounter (Signed)
Pt requests we call her with lab results from 11/30, she has seen some in [place] but some are still pending and she wants to speak with someone about the results to make sure she understands.

## 2020-11-19 NOTE — Telephone Encounter (Signed)
Left a VM for patient to return our call.

## 2020-11-20 ENCOUNTER — Ambulatory Visit: Payer: Self-pay | Admitting: Obstetrics and Gynecology

## 2020-11-25 ENCOUNTER — Other Ambulatory Visit (HOSPITAL_COMMUNITY)
Admission: RE | Admit: 2020-11-25 | Discharge: 2020-11-25 | Disposition: A | Discharge status: Home / Self Care | Payer: 59 | Source: Ambulatory Visit | Attending: Plastic Surgery | Admitting: Plastic Surgery

## 2020-11-25 ENCOUNTER — Ambulatory Visit (INDEPENDENT_AMBULATORY_CARE_PROVIDER_SITE_OTHER): Payer: 59 | Admitting: Plastic Surgery

## 2020-11-25 ENCOUNTER — Encounter: Payer: Self-pay | Admitting: Plastic Surgery

## 2020-11-25 ENCOUNTER — Other Ambulatory Visit: Payer: Self-pay

## 2020-11-25 VITALS — BP 127/88 | HR 86 | Temp 98.2°F | Ht 65.0 in | Wt 250.0 lb

## 2020-11-25 DIAGNOSIS — D2239 Melanocytic nevi of other parts of face: Secondary | ICD-10-CM

## 2020-11-25 NOTE — Progress Notes (Signed)
Operative Note   DATE OF OPERATION: 11/25/2020  SURGICAL DEPARTMENT: Plastic Surgery  PREOPERATIVE DIAGNOSES: Melanocytic nevi of the nasal dorsum  POSTOPERATIVE DIAGNOSES:  same  PROCEDURE: Shave excision of melanocytic nevi of nasal dorsum 6 mm diameter  SURGEON: Talmadge Coventry, MD  ASSISTANT: None  ANESTHESIA: Local.   COMPLICATIONS: None.   INDICATIONS FOR PROCEDURE:  The patient, Peggy Schmidt is a 39 y.o. female born on 1981-05-17, is here for treatment of melanocytic nevi MRN: [id number]  CONSENT:  Informed consent was obtained directly from the patient. Risks, benefits and alternatives were fully discussed. Specific risks including but not limited to bleeding, infection, hematoma, seroma, scarring, pain, contracture, asymmetry, wound healing problems, and need for further surgery were all discussed. The patient did have an ample opportunity to have questions answered to satisfaction.   DESCRIPTION OF PROCEDURE:  Local anesthesia was infiltrated.  There was given time to work.  A 15 blade was then used to do a shave excision of the lesion about 6 mm in diameter.  Hemostasis was obtained with pressure.  Ointment and Band-Aid were applied.  Patient tolerated procedure well.Marland Kitchen

## 2020-11-25 NOTE — Progress Notes (Signed)
Referring Provider Vivi Barrack, MD [address] [place],  [place] [address]   CC:  Chief Complaint  Patient presents with  . Advice Only      Peggy Schmidt is an 39 y.o. female.  HPI: Patient presents to discuss a pigmented lesion of the nose has been present for a number of years.  It has not been changing as much but it does itch and it is becoming raised and bothers her.  She is interested in having it excised.  She was sent by her dermatologist.  Allergies  Allergen Reactions  . Other     Black walnuts     Outpatient Encounter Medications as of 11/25/2020  Medication Sig  . fluticasone (FLONASE) 50 MCG/ACT nasal spray Place 2 sprays into both nostrils daily.  . metFORMIN (GLUCOPHAGE) 1000 MG tablet Take 1 tablet (1,000 mg total) by mouth 2 (two) times daily with a meal.  . mupirocin ointment (BACTROBAN) 2 % Apply 1 application topically 2 (two) times daily. (Patient not taking: Reported on 11/17/2020)  . olmesartan-hydrochlorothiazide (BENICAR HCT) 20-12.5 MG tablet TAKE ONE TABLET BY MOUTH DAILY  . Semaglutide,0.25 or 0.5MG /DOS, (OZEMPIC, 0.25 OR 0.5 MG/DOSE,) 2 MG/1.5ML SOPN Inject 0.25Mg  weekly.   Facility-Administered Encounter Medications as of 11/25/2020  Medication  . medroxyPROGESTERone (DEPO-PROVERA) injection 150 mg     Past Medical History:  Diagnosis Date  . Abnormal uterine bleeding   . Anemia   . Asthma   . DM (diabetes mellitus), type 2 ([place])   . Fibroid   . GERD (gastroesophageal reflux disease)   . Hay fever   . Hypertension    pt states her blood pressure has been up and down for the last year  . Post concussion syndrome 2017    Past Surgical History:  Procedure Laterality Date  . ANKLE FRACTURE SURGERY      Family History  Problem Relation Age of Onset  . Hypertension Mother   . Diabetes Mother   . Depression Mother   . Hyperlipidemia Mother   . Hypertension Brother   . Hypertension Maternal Grandmother   . Diabetes Father   .  Hypertension Father     Social History   Social History Narrative  . Not on file     Review of Systems General: Denies fevers, chills, weight loss CV: Denies chest pain, shortness of breath, palpitations  Physical Exam Vitals with BMI 11/25/2020 11/17/2020 10/30/2020  Height 5\' 5"  5\' 5"  5\' 5"   Weight 250 lbs 255 lbs 6 oz 255 lbs 13 oz  BMI 41.6 27.7 41.28  Systolic 786 767 209  Diastolic 88 92 78  Pulse 86 93 93    General:  No acute distress,  Alert and oriented, Non-Toxic, Normal speech and affect Examination shows a 4 to 5 mm diameter pigmented papule in the left nasal dorsum.  There is no surrounding skin changes or other worrisome features.  The borders are fairly regular and the color is uniform.  Assessment/Plan Patient presents with a benign-appearing pigmented lesion in the nasal dorsum.  We discussed excision.  We discussed the risks include bleeding, infection, damage to surrounding structures and need for additional procedures.  Recommended shave excision as I think this would give the least obvious scar.  I discussed if the scar is unsatisfactory I can always excise that which would look just like a excision would if I had done not to start with.  She is interested in moving forward.  Cindra Presume  11/25/2020, 4:03 PM

## 2020-12-01 LAB — SURGICAL PATHOLOGY

## 2020-12-01 LAB — HEMOGLOBIN A1C: Hemoglobin A1C: 8.1

## 2020-12-02 ENCOUNTER — Ambulatory Visit: Payer: 59 | Admitting: Family Medicine

## 2020-12-04 ENCOUNTER — Ambulatory Visit (INDEPENDENT_AMBULATORY_CARE_PROVIDER_SITE_OTHER): Payer: 59 | Admitting: Family Medicine

## 2020-12-04 ENCOUNTER — Encounter: Payer: Self-pay | Admitting: Family Medicine

## 2020-12-04 ENCOUNTER — Other Ambulatory Visit: Payer: Self-pay

## 2020-12-04 VITALS — BP 131/84 | HR 71 | Temp 97.4°F | Ht 65.0 in | Wt 254.2 lb

## 2020-12-04 DIAGNOSIS — I10 Essential (primary) hypertension: Secondary | ICD-10-CM | POA: Diagnosis not present

## 2020-12-04 DIAGNOSIS — N939 Abnormal uterine and vaginal bleeding, unspecified: Secondary | ICD-10-CM

## 2020-12-04 DIAGNOSIS — E1169 Type 2 diabetes mellitus with other specified complication: Secondary | ICD-10-CM | POA: Diagnosis not present

## 2020-12-04 DIAGNOSIS — Z789 Other specified health status: Secondary | ICD-10-CM

## 2020-12-04 MED ORDER — MEDROXYPROGESTERONE ACETATE 150 MG/ML IM SUSP
150.0000 mg | Freq: Once | INTRAMUSCULAR | Status: AC
  Administered 2020-12-04: 150 mg via INTRAMUSCULAR

## 2020-12-04 MED ORDER — OZEMPIC (0.25 OR 0.5 MG/DOSE) 2 MG/1.5ML ~~LOC~~ SOPN
0.5000 mg | PEN_INJECTOR | SUBCUTANEOUS | 3 refills | Status: DC
Start: 2020-12-04 — End: 2021-04-26

## 2020-12-04 NOTE — Assessment & Plan Note (Signed)
Depo-Provera given today.

## 2020-12-04 NOTE — Assessment & Plan Note (Addendum)
Well-controlled.  Continue olmesartan-HCTZ 20-12.5mg  once daily.

## 2020-12-04 NOTE — Progress Notes (Signed)
   Peggy Schmidt is a 39 y.o. female who presents today for an office visit.  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  Much better at 8.5.  We will increase Ozempic to 0.5 mg weekly.  Continue Metformin 1000 mg twice daily.  Recheck A1c in 3 to 6 months.  Abnormal uterine bleeding (AUB) Depo-Provera given today.  Essential hypertension Well-controlled.  Continue olmesartan-HCTZ 20-12.5mg  once daily.      Subjective:  HPI:  See A/p.         Objective:  Physical Exam: BP 131/84   Pulse 71   Temp (!) 97.4 F (36.3 C) (Temporal)   Ht 5\' 5"  (1.651 m)   Wt 254 lb 3.2 oz (115.3 kg)   SpO2 100%   BMI 42.30 kg/m   Wt Readings from Last 3 Encounters:  12/04/20 254 lb 3.2 oz (115.3 kg)  11/25/20 250 lb (113.4 kg)  11/17/20 255 lb 6.4 oz (115.8 kg)  Gen: No acute distress, resting comfortably Neuro: Grossly normal, moves all extremities Psych: Normal affect and thought content      Estefanny Moler M. [name], MD 12/04/2020 9:15 AM

## 2020-12-04 NOTE — Patient Instructions (Signed)
It was very nice to see you today!  I am glad that your blood sugar looks much better.  Please increase your Ozempic 0.5 mg weekly.  I will send in a for scription.  We will give your Depo injection today.  I would like to see back in 3 to 6 months to recheck your blood sugar.  Please come back to see me sooner if needed.  Take care, Dr [name]  Please try these tips to maintain a healthy lifestyle:   Eat at least 3 REAL meals and 1-2 snacks per day.  Aim for no more than 5 hours between eating.  If you eat breakfast, please do so within one hour of getting up.    Each meal should contain half fruits/vegetables, one quarter protein, and one quarter carbs (no bigger than a computer mouse)   Cut down on sweet beverages. This includes juice, soda, and sweet tea.     Drink at least 1 glass of water with each meal and aim for at least 8 glasses per day   Exercise at least 150 minutes every week.

## 2020-12-04 NOTE — Assessment & Plan Note (Signed)
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  Much better at 8.5.  We will increase Ozempic to 0.5 mg weekly.  Continue Metformin 1000 mg twice daily.  Recheck A1c in 3 to 6 months.

## 2020-12-09 ENCOUNTER — Ambulatory Visit: Payer: 59

## 2020-12-09 ENCOUNTER — Telehealth: Payer: Self-pay | Admitting: *Deleted

## 2020-12-09 NOTE — Telephone Encounter (Signed)
PA Rx #: [id number] Ozempic (0.25 or 0.5 MG/DOSE) 2MG /1.5ML pen-injectors send today Awaiting for determination

## 2020-12-18 ENCOUNTER — Encounter: Payer: Self-pay | Admitting: Family Medicine

## 2020-12-21 NOTE — Telephone Encounter (Signed)
Does patient need office visit

## 2020-12-25 ENCOUNTER — Encounter: Payer: Self-pay | Admitting: Family Medicine

## 2020-12-25 ENCOUNTER — Telehealth: Payer: Self-pay | Admitting: *Deleted

## 2020-12-25 NOTE — Telephone Encounter (Signed)
PA (Key: [id number]) send today  Ozempic (0.25 or 0.5 MG/DOSE) 2MG /1.5ML pen-injectors Awaiting for determination

## 2020-12-28 ENCOUNTER — Other Ambulatory Visit: Payer: 59

## 2020-12-28 DIAGNOSIS — Z20822 Contact with and (suspected) exposure to covid-19: Secondary | ICD-10-CM

## 2020-12-28 NOTE — Telephone Encounter (Signed)
Left message to return call to our office at their convenience.  

## 2020-12-28 NOTE — Telephone Encounter (Signed)
Pt stated feeling better will be retested today  Aware no antibody infusion needed

## 2020-12-30 LAB — NOVEL CORONAVIRUS, NAA: SARS-CoV-2, NAA: NOT DETECTED

## 2020-12-30 LAB — SARS-COV-2, NAA 2 DAY TAT

## 2021-01-05 NOTE — Telephone Encounter (Signed)
Rx denied, no insurance coverage

## 2021-01-05 NOTE — Telephone Encounter (Signed)
PA denied, no insurance coverage

## 2021-01-07 ENCOUNTER — Ambulatory Visit: Payer: Self-pay | Admitting: Obstetrics and Gynecology

## 2021-02-19 ENCOUNTER — Encounter: Payer: 59 | Admitting: Family Medicine

## 2021-02-22 ENCOUNTER — Encounter: Payer: Self-pay | Admitting: Family Medicine

## 2021-02-22 ENCOUNTER — Ambulatory Visit (INDEPENDENT_AMBULATORY_CARE_PROVIDER_SITE_OTHER): Payer: 59 | Admitting: Family Medicine

## 2021-02-22 ENCOUNTER — Other Ambulatory Visit: Payer: Self-pay

## 2021-02-22 VITALS — BP 117/84 | HR 97 | Temp 97.6°F | Ht 65.0 in | Wt 246.6 lb

## 2021-02-22 DIAGNOSIS — Z634 Disappearance and death of family member: Secondary | ICD-10-CM

## 2021-02-22 DIAGNOSIS — I152 Hypertension secondary to endocrine disorders: Secondary | ICD-10-CM

## 2021-02-22 DIAGNOSIS — E559 Vitamin D deficiency, unspecified: Secondary | ICD-10-CM | POA: Diagnosis not present

## 2021-02-22 DIAGNOSIS — Z0001 Encounter for general adult medical examination with abnormal findings: Secondary | ICD-10-CM | POA: Diagnosis not present

## 2021-02-22 DIAGNOSIS — E1169 Type 2 diabetes mellitus with other specified complication: Secondary | ICD-10-CM

## 2021-02-22 DIAGNOSIS — E785 Hyperlipidemia, unspecified: Secondary | ICD-10-CM

## 2021-02-22 DIAGNOSIS — Z6841 Body Mass Index (BMI) 40.0 and over, adult: Secondary | ICD-10-CM

## 2021-02-22 DIAGNOSIS — E1159 Type 2 diabetes mellitus with other circulatory complications: Secondary | ICD-10-CM | POA: Diagnosis not present

## 2021-02-22 LAB — COMPREHENSIVE METABOLIC PANEL
ALT: 12 U/L (ref 0–35)
AST: 12 U/L (ref 0–37)
Albumin: 4.4 g/dL (ref 3.5–5.2)
Alkaline Phosphatase: 92 U/L (ref 39–117)
BUN: 9 mg/dL (ref 6–23)
CO2: 28 mEq/L (ref 19–32)
Calcium: 9.8 mg/dL (ref 8.4–10.5)
Chloride: 103 mEq/L (ref 96–112)
Creatinine, Ser: 0.77 mg/dL (ref 0.40–1.20)
GFR: 98.28 mL/min (ref >60.00)
Glucose, Bld: 140 mg/dL — ABNORMAL HIGH (ref 70–99)
Potassium: 3.8 mEq/L (ref 3.5–5.1)
Sodium: 139 mEq/L (ref 135–145)
Total Bilirubin: 0.5 mg/dL (ref 0.2–1.2)
Total Protein: 7.7 g/dL (ref 6.0–8.3)

## 2021-02-22 LAB — HEMOGLOBIN A1C: Hgb A1c MFr Bld: 7.8 % — ABNORMAL HIGH (ref 4.6–6.5)

## 2021-02-22 LAB — CBC
HCT: 42.1 % (ref 36.0–46.0)
Hemoglobin: 14.1 g/dL (ref 12.0–15.0)
MCHC: 33.6 g/dL (ref 30.0–36.0)
MCV: 82.9 fl (ref 78.0–100.0)
Platelets: 396 10*3/uL (ref 150.0–400.0)
RBC: 5.08 Mil/uL (ref 3.87–5.11)
RDW: 14.1 % (ref 11.5–15.5)
WBC: 9.5 10*3/uL (ref 4.0–10.5)

## 2021-02-22 LAB — LIPID PANEL
Cholesterol: 211 mg/dL — ABNORMAL HIGH (ref 0–200)
HDL: 46.8 mg/dL (ref >39.00)
LDL Cholesterol: 147 mg/dL — ABNORMAL HIGH (ref 0–99)
NonHDL: 163.9
Total CHOL/HDL Ratio: 5
Triglycerides: 83 mg/dL (ref 0.0–149.0)
VLDL: 16.6 mg/dL (ref 0.0–40.0)

## 2021-02-22 LAB — VITAMIN D 25 HYDROXY (VIT D DEFICIENCY, FRACTURES): VITD: 32.8 ng/mL (ref 30.00–100.00)

## 2021-02-22 LAB — TSH: TSH: 1.13 u[IU]/mL (ref 0.35–4.50)

## 2021-02-22 LAB — VITAMIN B12: Vitamin B-12: 406 pg/mL (ref 211–911)

## 2021-02-22 NOTE — Progress Notes (Signed)
Chief Complaint:  Peggy Schmidt is a 40 y.o. female who presents today for her annual comprehensive physical exam.    Assessment/Plan:  New/Acute Problems: Bereavement Patient recently lost older brother a few weeks ago due to a aortic dissection.  Offered words of encouragement.  She has good support structure in place and has a therapist.  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  Likely source of her pruritus.  This was irrigated successfully today by RMA.  Chronic Problems Addressed Today: DM (diabetes mellitus), type 2 (Peggy Schmidt) Cannot tolerate Metformin due to nausea.  We will continue Ozempic 0.5 mg weekly.  Check A1c today.  Will likely need to follow-up in 3 to 6 months depending on result of A1c.  Dyslipidemia associated with type 2 diabetes mellitus (HCC) Check lipids.  Hypertension associated with diabetes ([place]) At goal on olmesartan-HCTZ 20-12.5mg  once daily.  Check labs today.   Body mass index is 41.04 kg/m. / Obese  BMI Metric Follow Up - 02/22/21 1047      BMI Metric Follow Up-Please document annually   BMI Metric Follow Up Education provided            Preventative Healthcare: Check labs.  Up-to-date on Pap.  Patient Counseling(The following topics were reviewed and/or handout was given):  -Nutrition: Stressed importance of moderation in sodium/caffeine intake, saturated fat and cholesterol, caloric balance, sufficient intake of fresh fruits, vegetables, and fiber.  -Stressed the importance of regular exercise.   -Substance Abuse: Discussed cessation/primary prevention of tobacco, alcohol, or other drug use; driving or other dangerous activities under the influence; availability of treatment for abuse.   -Injury prevention: Discussed safety belts, safety helmets, smoke detector, smoking near bedding or upholstery.   -Sexuality: Discussed sexually transmitted diseases, partner selection, use of condoms, avoidance of unintended pregnancy and contraceptive  alternatives.   -Dental health: Discussed importance of regular tooth brushing, flossing, and dental visits.  -Health maintenance and immunizations reviewed. Please refer to Health maintenance section.  Return to care in 1 year for next preventative visit.     Subjective:  HPI:  She has no acute complaints today.  See A/P.  Lifestyle Diet: More fruits and vegetables. Trying to get plenty of chicken and fish. Cutting down portion.  Exercise: Doing more walking.   Depression screen PHQ 2/9 10/30/2020  Decreased Interest 1  Down, Depressed, Hopeless 0  PHQ - 2 Score 1  Altered sleeping 0  Tired, decreased energy 1  Change in appetite 0  Feeling bad or failure about yourself  0  Trouble concentrating 0  Moving slowly or fidgety/restless 0  Suicidal thoughts 0  PHQ-9 Score 2  Difficult doing work/chores Not difficult at all    Health Maintenance Due  Topic Date Due  . Hepatitis C Screening  Never done  . OPHTHALMOLOGY EXAM  Never done  . FOOT EXAM  12/04/2019  . COVID-19 Vaccine (2 - Booster for Janssen series) 05/18/2020     ROS: Per HPI, otherwise a complete review of systems was negative.   PMH:  The following were reviewed and entered/updated in epic: Past Medical History:  Diagnosis Date  . Abnormal uterine bleeding   . Anemia   . Asthma   . DM (diabetes mellitus), type 2 ([place])   . Fibroid   . GERD (gastroesophageal reflux disease)   . Hay fever   . Hypertension    pt states her blood pressure has been up and down for the last year  .  Post concussion syndrome 2017   Patient Active Problem List   Diagnosis Date Noted  . DM (diabetes mellitus), type 2 ([place])   . Dyslipidemia associated with type 2 diabetes mellitus ([place]) 09/21/2018  . Morbid obesity ([place]) 09/21/2018  . Abnormal uterine bleeding (AUB) 06/11/2018  . Microcytic anemia 02/15/2018  . Hypertension associated with diabetes ([place]) 02/15/2018  . Chronic pain of left knee 02/15/2018  . History of  head injury 02/15/2018  . Fibroid uterus 01/15/2016   Past Surgical History:  Procedure Laterality Date  . ANKLE FRACTURE SURGERY      Family History  Problem Relation Age of Onset  . Hypertension Mother   . Diabetes Mother   . Depression Mother   . Hyperlipidemia Mother   . Hypertension Brother   . Hypertension Maternal Grandmother   . Diabetes Father   . Hypertension Father     Medications- reviewed and updated Current Outpatient Medications  Medication Sig Dispense Refill  . fluticasone (FLONASE) 50 MCG/ACT nasal spray Place 2 sprays into both nostrils daily. 16 g 6  . olmesartan-hydrochlorothiazide (BENICAR HCT) 20-12.5 MG tablet TAKE ONE TABLET BY MOUTH DAILY 30 tablet 10  . Semaglutide,0.25 or 0.5MG /DOS, (OZEMPIC, 0.25 OR 0.5 MG/DOSE,) 2 MG/1.5ML SOPN Inject 0.5 mg into the skin once a week. 1.5 mL 3   Current Facility-Administered Medications  Medication Dose Route Frequency Provider Last Rate Last Admin  . medroxyPROGESTERone (DEPO-PROVERA) injection 150 mg  150 mg Intramuscular Q8 Weeks Anyanwu, Ugonna A, MD   150 mg at 02/26/20 1430    Allergies-reviewed and updated Allergies  Allergen Reactions  . Other     Black walnuts     Social History   Socioeconomic History  . Marital status: Single    Spouse name: Not on file  . Number of children: Not on file  . Years of education: Not on file  . Highest education level: Not on file  Occupational History  . Not on file  Tobacco Use  . Smoking status: Never Smoker  . Smokeless tobacco: Never Used  Vaping Use  . Vaping Use: Never used  Substance and Sexual Activity  . Alcohol use: No  . Drug use: No  . Sexual activity: Not on file    Comment: female partner  Other Topics Concern  . Not on file  Social History Narrative  . Not on file   Social Determinants of Health   Financial Resource Strain: Not on file  Food Insecurity: Not on file  Transportation Needs: Not on file  Physical Activity: Not on  file  Stress: Not on file  Social Connections: Not on file        Objective:  Physical Exam: BP 117/84   Pulse 97   Temp 97.6 F (36.4 C) (Temporal)   Ht 5\' 5"  (1.651 m)   Wt 246 lb 9.6 oz (111.9 kg)   SpO2 97%   BMI 41.04 kg/m   Body mass index is 41.04 kg/m. Wt Readings from Last 3 Encounters:  02/22/21 246 lb 9.6 oz (111.9 kg)  12/04/20 254 lb 3.2 oz (115.3 kg)  11/25/20 250 lb (113.4 kg)   Gen: NAD, resting comfortably HEENT: TMs normal bilaterally. OP clear. No thyromegaly noted.  CV: RRR with no murmurs appreciated Pulm: NWOB, CTAB with no crackles, wheezes, or rhonchi GI: Normal bowel sounds present. Soft, Nontender, Nondistended. MSK: no edema, cyanosis, or clubbing noted Skin: warm, dry Neuro: CN2-12 grossly intact. Strength 5/5 in upper and lower extremities. Reflexes symmetric  and intact bilaterally.  Psych: Normal affect and thought content     Caleb M. [name], MD 02/22/2021 10:48 AM

## 2021-02-22 NOTE — Assessment & Plan Note (Signed)
Cannot tolerate Metformin due to nausea.  We will continue Ozempic 0.5 mg weekly.  Check A1c today.  Will likely need to follow-up in 3 to 6 months depending on result of A1c.

## 2021-02-22 NOTE — Patient Instructions (Signed)
It was very nice to see you today!  I am so sorry for your loss.  Please let us know if you need any extra help during this difficult time.  We we will check blood work today.  We will flush her ear.  I will probably see you back in 3 to 6 months depending on the results of your A1c.  Take care, Dr [name]  Please try these tips to maintain a healthy lifestyle:   Eat at least 3 REAL meals and 1-2 snacks per day.  Aim for no more than 5 hours between eating.  If you eat breakfast, please do so within one hour of getting up.    Each meal should contain half fruits/vegetables, one quarter protein, and one quarter carbs (no bigger than a computer mouse)   Cut down on sweet beverages. This includes juice, soda, and sweet tea.     Drink at least 1 glass of water with each meal and aim for at least 8 glasses per day   Exercise at least 150 minutes every week.    Preventive Care 27-73 Years Old, Female Preventive care refers to lifestyle choices and visits with your health care provider that can promote health and wellness. This includes:  A yearly physical exam. This is also called an annual wellness visit.  Regular dental and eye exams.  Immunizations.  Screening for certain conditions.  Healthy lifestyle choices, such as: ? Eating a healthy diet. ? Getting regular exercise. ? Not using drugs or products that contain nicotine and tobacco. ? Limiting alcohol use. What can I expect for my preventive care visit? Physical exam Your health care provider may check your:  Height and weight. These may be used to calculate your BMI (body mass index). BMI is a measurement that tells if you are at a healthy weight.  Heart rate and blood pressure.  Body temperature.  Skin for abnormal spots. Counseling Your health care provider may ask you questions about your:  Past medical problems.  Family's medical history.  Alcohol, tobacco, and drug use.  Emotional  well-being.  Home life and relationship well-being.  Sexual activity.  Diet, exercise, and sleep habits.  Work and work Statistician.  Access to firearms.  Method of birth control.  Menstrual cycle.  Pregnancy history. What immunizations do I need? Vaccines are usually given at various ages, according to a schedule. Your health care provider will recommend vaccines for you based on your age, medical history, and lifestyle or other factors, such as travel or where you work.   What tests do I need? Blood tests  Lipid and cholesterol levels. These may be checked every 5 years starting at age 81.  Hepatitis C test.  Hepatitis B test. Screening  Diabetes screening. This is done by checking your blood sugar (glucose) after you have not eaten for a while (fasting).  STD (sexually transmitted disease) testing, if you are at risk.  BRCA-related cancer screening. This may be done if you have a family history of breast, ovarian, tubal, or peritoneal cancers.  Pelvic exam and Pap test. This may be done every 3 years starting at age 45. Starting at age 64, this may be done every 5 years if you have a Pap test in combination with an HPV test. Talk with your health care provider about your test results, treatment options, and if necessary, the need for more tests.   Follow these instructions at home: Eating and drinking  Eat a healthy diet that  includes fresh fruits and vegetables, whole grains, lean protein, and low-fat dairy products.  Take vitamin and mineral supplements as recommended by your health care provider.  Do not drink alcohol if: ? Your health care provider tells you not to drink. ? You are pregnant, may be pregnant, or are planning to become pregnant.  If you drink alcohol: ? Limit how much you have to 0-1 drink a day. ? Be aware of how much alcohol is in your drink. In the [place] one drink equals one 12 oz bottle of beer (355 mL), one 5 oz glass of wine (148 mL), or  one 1 oz glass of hard liquor (44 mL).   Lifestyle  Take daily care of your teeth and gums. Brush your teeth every morning and night with fluoride toothpaste. Floss one time each day.  Stay active. Exercise for at least 30 minutes 5 or more days each week.  Do not use any products that contain nicotine or tobacco, such as cigarettes, e-cigarettes, and chewing tobacco. If you need help quitting, ask your health care provider.  Do not use drugs.  If you are sexually active, practice safe sex. Use a condom or other form of protection to prevent STIs (sexually transmitted infections).  If you do not wish to become pregnant, use a form of birth control. If you plan to become pregnant, see your health care provider for a prepregnancy visit.  Find healthy ways to cope with stress, such as: ? Meditation, yoga, or listening to music. ? Journaling. ? Talking to a trusted person. ? Spending time with friends and family. Safety  Always wear your seat belt while driving or riding in a vehicle.  Do not drive: ? If you have been drinking alcohol. Do not ride with someone who has been drinking. ? When you are tired or distracted. ? While texting.  Wear a helmet and other protective equipment during sports activities.  If you have firearms in your house, make sure you follow all gun safety procedures.  Seek help if you have been physically or sexually abused. What's next?  Go to your health care provider once a year for an annual wellness visit.  Ask your health care provider how often you should have your eyes and teeth checked.  Stay up to date on all vaccines. This information is not intended to replace advice given to you by your health care provider. Make sure you discuss any questions you have with your health care provider. Document Revised: 08/02/2020 Document Reviewed: 08/16/2018 Elsevier Patient Education  2021 [employer].

## 2021-02-22 NOTE — Assessment & Plan Note (Signed)
Check lipids 

## 2021-02-22 NOTE — Assessment & Plan Note (Signed)
At goal on olmesartan-HCTZ 20-12.5mg  once daily.  Check labs today.

## 2021-02-23 ENCOUNTER — Telehealth: Payer: Self-pay

## 2021-02-23 NOTE — Telephone Encounter (Signed)
Pt called asking if Junious Dresser could call her back and discuss lab results once Dr. [name] comments on them. Please advise.

## 2021-02-23 NOTE — Telephone Encounter (Signed)
See below

## 2021-02-23 NOTE — Progress Notes (Signed)
Please inform patient of the following:  A1c is better but still slightly above goal.  We can continue current dose of Ozempic and recheck in 3 months.  Her cholesterol is high.  She would benefit from starting cholesterol medication to lower risk of heart attack and stroke.  Please send in Lipitor 40 mg daily if she is willing to start.  Everything else is NORMAL.  Peggy Schmidt. [name], MD 02/23/2021 2:10 PM

## 2021-02-24 NOTE — Telephone Encounter (Signed)
See results note. 

## 2021-03-02 ENCOUNTER — Other Ambulatory Visit: Payer: Self-pay

## 2021-03-02 ENCOUNTER — Ambulatory Visit (INDEPENDENT_AMBULATORY_CARE_PROVIDER_SITE_OTHER): Payer: 59 | Admitting: Family Medicine

## 2021-03-02 ENCOUNTER — Encounter: Payer: Self-pay | Admitting: Family Medicine

## 2021-03-02 ENCOUNTER — Telehealth: Payer: Self-pay | Admitting: Family Medicine

## 2021-03-02 VITALS — BP 119/81 | HR 83 | Temp 98.3°F | Ht 65.0 in | Wt 247.0 lb

## 2021-03-02 DIAGNOSIS — E785 Hyperlipidemia, unspecified: Secondary | ICD-10-CM | POA: Diagnosis not present

## 2021-03-02 DIAGNOSIS — Z789 Other specified health status: Secondary | ICD-10-CM | POA: Diagnosis not present

## 2021-03-02 DIAGNOSIS — E1169 Type 2 diabetes mellitus with other specified complication: Secondary | ICD-10-CM | POA: Diagnosis not present

## 2021-03-02 MED ORDER — MEDROXYPROGESTERONE ACETATE 150 MG/ML IM SUSP
150.0000 mg | Freq: Once | INTRAMUSCULAR | Status: AC
  Administered 2021-03-02: 150 mg via INTRAMUSCULAR

## 2021-03-02 NOTE — Progress Notes (Signed)
   Peggy Schmidt is a 40 y.o. female who presents today for an office visit.  Assessment/Plan:  New/Acute Problems: Ganglion Cyst No red flags.  Reassured patient.  Discussed attempt at aspiration today however given small size and degree of mobility she would probably be better suited to have this done under ultrasound.  She will follow-up with her sports medicine doctor to discuss this.  Chronic Problems Addressed Today: DM (diabetes mellitus), type 2 (Peggy Schmidt) Doing well with Ozempic 0.5 mg weekly.  Last A1c 7.8.  3 to 6 months recheck A1c.  Dyslipidemia associated with type 2 diabetes mellitus (Peggy Schmidt) Patient deferred starting statin.  She will continue working on weight loss with diet and exercise.  We can recheck in 6 to 12 months.  Depoprovera injection given today.     Subjective:  HPI:  Patient with lump on right dorsal wrist.  More painful.  No treatments tried.  No injuries.  No obvious precipitating events.  Has been present for several months.       Objective:  Physical Exam: BP 119/81   Pulse 83   Temp 98.3 F (36.8 C) (Temporal)   Ht 5\' 5"  (1.651 m)   Wt 247 lb (112 kg)   SpO2 100%   BMI 41.10 kg/m   Gen: No acute distress, resting comfortably MSK: Freely mobile approximately 1 cm cyst on right dorsal wrist. Neuro: Grossly normal, moves all extremities Psych: Normal affect and thought content      Peggy Golphin M. [name], MD 03/02/2021 2:53 PM

## 2021-03-02 NOTE — Progress Notes (Signed)
Rito Ehrlich, am serving as a Education administrator for Dr. [name].  Peggy Schmidt is a 40 y.o. female who presents to [place] [facility] today for nodule on R wrist. Pt was last seen by Dr. Georgina Snell on 11/17/20 for chronic bilat knee pain. Pt reports nodule appeared on R wrist Last month Located on top of her wrist, states that she has had this before and the cyst will come and go. Patient states that her thumb is sore which is new and wanted to get it looked at.    Numbness/tingling: no  Aggravates: holding phone, wringing out a towel, picking something up Treatments tried: Over-the-counter medications  Pertinent review of systems: No fevers or chills  Relevant historical information: Diabetes, hypertension   Exam:  BP 110/70 (BP Location: Left Arm, Patient Position: Sitting, Cuff Size: Large)   Pulse 94   Ht 5\' 5"  (1.651 m)   Wt 247 lb (112 kg)   SpO2 99%   BMI 41.10 kg/m  General: Well Developed, well nourished, and in no acute distress.   MSK: Right wrist nodule dorsal radial wrist present.  Mildly tender palpation.  Some pain with thumb extension Wrist motion intact.  Strength intact. Pulses cap refill and sensation are intact distally.    Lab and Radiology Results  Procedure: Real-time Ultrasound Guided aspiration and injection of right wrist ganglion cyst Device: Philips Affiniti 50G Images permanently stored and available for review in PACS Inspection prior to injection reveals a moderate sized dorsal wrist ganglion cyst measuring approximately 1 cm in diameter Verbal informed consent obtained.  Discussed risks and benefits of procedure. Warned about infection bleeding damage to structures skin hypopigmentation and fat atrophy among others. Patient expresses understanding and agreement Time-out conducted.   Noted no overlying erythema, induration, or other signs of local infection.   Skin prepped in a sterile fashion.   Local anesthesia: Topical  Ethyl chloride.   With sterile technique and under real time ultrasound guidance:  1 mL of lidocaine injected into subcutaneous tissue superficial to the ganglion cyst and into ganglion cyst itself. Skin was again sterilized and an 18-gauge needle was used to access the ganglion cyst.  1 mL of clear gelatinous material was aspirated. Cyst was seen to be decompressed ultrasound following aspiration. The needle was removed and the skin was sterilized again. Using a 25-gauge needle 20 mg of Kenalog and 0.5 mL of Marcaine was injected into and around the now decompressed ganglion cyst dorsal wrist Completed without difficulty   Pain immediately resolved suggesting accurate placement of the medication.   Advised to call if fevers/chills, erythema, induration, drainage, or persistent bleeding.   Images permanently stored and available for review in the ultrasound unit.  Impression: Technically successful ultrasound guided injection.      Assessment and Plan: 40 y.o. female with right wrist dorsal ganglion cyst.  Plan for aspiration and injection today.  Recheck back as needed.   PDMP not reviewed this encounter. Orders Placed This Encounter  Procedures  . [place] LIMITED JOINT SPACE STRUCTURES UP RIGHT(NO LINKED CHARGES)    Standing Status:   Future    Number of Occurrences:   1    Standing Expiration Date:   09/03/2021    Order Specific Question:   Reason for Exam (SYMPTOM  OR DIAGNOSIS REQUIRED)    Answer:   Wrist pain    Order Specific Question:   Preferred imaging location?    Answer:   Internal  No orders of the defined types were placed in this encounter.    Discussed warning signs or symptoms. Please see discharge instructions. Patient expresses understanding.   The above documentation has been reviewed and is accurate and complete [name], M.D.

## 2021-03-02 NOTE — Assessment & Plan Note (Signed)
Patient deferred starting statin.  She will continue working on weight loss with diet and exercise.  We can recheck in 6 to 12 months.

## 2021-03-02 NOTE — Assessment & Plan Note (Signed)
Doing well with Ozempic 0.5 mg weekly.  Last A1c 7.8.  3 to 6 months recheck A1c.

## 2021-03-02 NOTE — Patient Instructions (Signed)
It was very nice to see you today!  Please follow-up with sports medicine to discuss the cyst in your wrist.  We will probably need to have this drained under ultrasound.  I would like to see you back in a few months to recheck your blood sugar.  Please come back to see me sooner if needed.  Take care, Dr [name]  PLEASE NOTE:  If you had any lab tests please let us know if you have not heard back within a few days. You may see your results on mychart before we have a chance to review them but we will give you a call once they are reviewed by [place]. If we ordered any referrals today, please let us know if you have not heard from their office within the next week.   Please try these tips to maintain a healthy lifestyle:   Eat at least 3 REAL meals and 1-2 snacks per day.  Aim for no more than 5 hours between eating.  If you eat breakfast, please do so within one hour of getting up.    Each meal should contain half fruits/vegetables, one quarter protein, and one quarter carbs (no bigger than a computer mouse)   Cut down on sweet beverages. This includes juice, soda, and sweet tea.     Drink at least 1 glass of water with each meal and aim for at least 8 glasses per day   Exercise at least 150 minutes every week.    Ganglion Cyst  A ganglion cyst is a non-cancerous, fluid-filled lump of tissue that occurs near a joint, tendon, or ligament. The cyst grows out of a joint or the lining of a tendon or ligament. Ganglion cysts most often develop in the hand or wrist, but they can also develop in the shoulder, elbow, hip, knee, ankle, or foot. Ganglion cysts are ball-shaped or egg-shaped. Their size can range from the size of a pea to larger than a grape. Increased activity may cause the cyst to get bigger because more fluid starts to build up. What are the causes? The exact cause of this condition is not known, but it may be related to:  Inflammation or irritation around the joint.  An  injury or tear in the layers of tissue around the joint (joint capsule).  Repetitive movements or overuse.  History of acute or repeated injury. What increases the risk? You are more likely to develop this condition if:  You are a female.  You are 24-65 years old. What are the signs or symptoms? The main symptom of this condition is a lump. It most often appears on the hand or wrist. In many cases, there are no other symptoms, but a cyst can sometimes cause:  Tingling.  Pain or tenderness.  Numbness.  Weakness or loss of strength in the affected joint.  Decreased range of motion in the affected area of the body.   How is this diagnosed? Ganglion cysts are usually diagnosed based on a physical exam. Your health care provider will feel the lump and may shine a light next to it. If it is a ganglion cyst, the light will likely shine through it. Your health care provider may order an X-ray, ultrasound, MRI, or CT scan to rule out other conditions. How is this treated? Ganglion cysts often go away on their own without treatment. If you have pain or other symptoms, treatment may be needed. Treatment is also needed if the ganglion cyst limits your movement or  if it gets infected. Treatment may include:  Wearing a brace or splint on your wrist or finger.  Taking anti-inflammatory medicine.  Having fluid drained from the lump with a needle (aspiration).  Getting an injection of medicine into the joint to decrease inflammation. This may be corticosteroids, ethanol, or hyaluronidase.  Having surgery to remove the ganglion cyst.  Placing a pad in your shoe or wearing shoes that will not rub against the cyst if it is on your foot. Follow these instructions at home:  Do not press on the ganglion cyst, poke it with a needle, or hit it.  Take over-the-counter and prescription medicines only as told by your health care provider.  If you have a brace or splint: ? Wear it as told by your  health care provider. ? Remove it as told by your health care provider. Ask if you need to remove it when you take a shower or a bath.  Watch your ganglion cyst for any changes.  Keep all follow-up visits as told by your health care provider. This is important. Contact a health care provider if:  Your ganglion cyst becomes larger or more painful.  You have pus coming from the lump.  You have weakness or numbness in the affected area.  You have a fever or chills. Get help right away if:  You have a fever and have any of these in the cyst area: ? Increased redness. ? Red streaks. ? Swelling. Summary  A ganglion cyst is a non-cancerous, fluid-filled lump that occurs near a joint, tendon, or ligament.  Ganglion cysts most often develop in the hand or wrist, but they can also develop in the shoulder, elbow, hip, knee, ankle, or foot.  Ganglion cysts often go away on their own without treatment. This information is not intended to replace advice given to you by your health care provider. Make sure you discuss any questions you have with your health care provider. Document Revised: 02/26/2020 Document Reviewed: 02/26/2020 Elsevier Patient Education  [place].

## 2021-03-02 NOTE — Telephone Encounter (Signed)
Pt never got the gel, looks like authorization expired. Only needs L knee per pt, new [facility] card in [place].  Please call to schedule after approval.

## 2021-03-03 ENCOUNTER — Ambulatory Visit (INDEPENDENT_AMBULATORY_CARE_PROVIDER_SITE_OTHER): Payer: 59 | Admitting: Family Medicine

## 2021-03-03 ENCOUNTER — Encounter: Payer: Self-pay | Admitting: Family Medicine

## 2021-03-03 ENCOUNTER — Ambulatory Visit: Payer: Self-pay

## 2021-03-03 VITALS — BP 110/70 | HR 94 | Ht 65.0 in | Wt 247.0 lb

## 2021-03-03 DIAGNOSIS — M25531 Pain in right wrist: Secondary | ICD-10-CM

## 2021-03-03 DIAGNOSIS — M67431 Ganglion, right wrist: Secondary | ICD-10-CM

## 2021-03-03 NOTE — Patient Instructions (Signed)
Thank you for coming in today.   Call or go to the ER if you develop a large red swollen joint with extreme pain or oozing puss.    Recheck as needed.    

## 2021-03-04 ENCOUNTER — Ambulatory Visit: Payer: 59

## 2021-03-04 NOTE — Telephone Encounter (Signed)
Faxed to [facility] will call once authorizations are in

## 2021-03-09 ENCOUNTER — Encounter: Payer: Self-pay | Admitting: Family Medicine

## 2021-03-29 ENCOUNTER — Other Ambulatory Visit: Payer: Self-pay

## 2021-03-29 ENCOUNTER — Encounter (HOSPITAL_BASED_OUTPATIENT_CLINIC_OR_DEPARTMENT_OTHER): Payer: Self-pay

## 2021-03-29 ENCOUNTER — Emergency Department (HOSPITAL_BASED_OUTPATIENT_CLINIC_OR_DEPARTMENT_OTHER)
Admission: EM | Admit: 2021-03-29 | Discharge: 2021-03-30 | Disposition: A | Discharge status: Home / Self Care | Payer: 59 | Attending: Emergency Medicine | Admitting: Emergency Medicine

## 2021-03-29 DIAGNOSIS — E119 Type 2 diabetes mellitus without complications: Secondary | ICD-10-CM | POA: Diagnosis not present

## 2021-03-29 DIAGNOSIS — Z79899 Other long term (current) drug therapy: Secondary | ICD-10-CM | POA: Insufficient documentation

## 2021-03-29 DIAGNOSIS — I10 Essential (primary) hypertension: Secondary | ICD-10-CM | POA: Diagnosis not present

## 2021-03-29 DIAGNOSIS — R1032 Left lower quadrant pain: Secondary | ICD-10-CM | POA: Insufficient documentation

## 2021-03-29 DIAGNOSIS — N200 Calculus of kidney: Secondary | ICD-10-CM

## 2021-03-29 DIAGNOSIS — Z7984 Long term (current) use of oral hypoglycemic drugs: Secondary | ICD-10-CM | POA: Insufficient documentation

## 2021-03-29 MED ORDER — SODIUM CHLORIDE 0.9 % IV BOLUS
1000.0000 mL | Freq: Once | INTRAVENOUS | Status: AC
  Administered 2021-03-29: 1000 mL via INTRAVENOUS

## 2021-03-29 NOTE — ED Triage Notes (Signed)
Patient also states she has had upset stomach with light bleeding.

## 2021-03-29 NOTE — ED Provider Notes (Signed)
[facility] EMERGENCY DEPT Provider Note   CSN: [id number] Arrival date & time: 03/29/21  2307     History Chief Complaint  Patient presents with  . Abdominal Pain    Hx endometriosis, fibroid "on my uterus"    Peggy Schmidt is a 40 y.o. female.  Patient is a 40 year old female with past medical history of diabetes, hyperlipidemia, hypertension, uterine fibroids, and obesity.  Patient presenting today for evaluation of left lower quadrant pain.  This started earlier this morning and has worsened.  She describes a constant pain that is worse when she bends or moves.  She does describe noticing some blood on the toilet paper when she wiped this evening.  She denies any fevers or chills.  She denies any vaginal bleeding or discharge.  Patient denies any new sexual partners.  The history is provided by the patient.  Abdominal Pain Pain location:  LLQ Pain quality: stabbing   Pain radiates to:  Does not radiate Pain severity:  Moderate Onset quality:  Sudden Duration:  12 hours Timing:  Constant Progression:  Worsening Chronicity:  New Relieved by:  Nothing Worsened by:  Movement and palpation Ineffective treatments:  None tried      Past Medical History:  Diagnosis Date  . Abnormal uterine bleeding   . DM (diabetes mellitus), type 2 ([place])   . Fibroid   . Hay fever   . Hypertension    pt states her blood pressure has been up and down for the last year  . Post concussion syndrome 2017    Patient Active Problem List   Diagnosis Date Noted  . DM (diabetes mellitus), type 2 ([place])   . Dyslipidemia associated with type 2 diabetes mellitus ([place]) 09/21/2018  . Morbid obesity ([place]) 09/21/2018  . Abnormal uterine bleeding (AUB) 06/11/2018  . Microcytic anemia 02/15/2018  . Hypertension associated with diabetes ([place]) 02/15/2018  . Chronic pain of left knee 02/15/2018  . History of head injury 02/15/2018  . Fibroid uterus 01/15/2016    Past Surgical History:   Procedure Laterality Date  . ANKLE FRACTURE SURGERY       OB History    Gravida  0   Para  0   Term  0   Preterm  0   AB  0   Living  0     SAB  0   IAB  0   Ectopic  0   Multiple  0   Live Births  0           Family History  Problem Relation Age of Onset  . Hypertension Mother   . Diabetes Mother   . Depression Mother   . Hyperlipidemia Mother   . Hypertension Brother   . Hypertension Maternal Grandmother   . Diabetes Father   . Hypertension Father     Social History   Tobacco Use  . Smoking status: Never Smoker  . Smokeless tobacco: Never Used  Vaping Use  . Vaping Use: Never used  Substance Use Topics  . Alcohol use: No  . Drug use: No    Home Medications Prior to Admission medications   Medication Sig Start Date End Date Taking? Authorizing Provider  fluticasone (FLONASE) 50 MCG/ACT nasal spray Place 2 sprays into both nostrils daily. 05/09/19   Vivi Barrack, MD  olmesartan-hydrochlorothiazide [facility] HCT) 20-12.5 MG tablet TAKE ONE TABLET BY MOUTH DAILY 11/04/20   Vivi Barrack, MD  Semaglutide,0.25 or 0.5MG /DOS, (OZEMPIC, 0.25 OR 0.5 MG/DOSE,)  2 MG/1.5ML SOPN Inject 0.5 mg into the skin once a week. 12/04/20   Vivi Barrack, MD    Allergies    Other  Review of Systems   Review of Systems  Gastrointestinal: Positive for abdominal pain.  All other systems reviewed and are negative.   Physical Exam Updated Vital Signs Ht 5\' 5"  (1.651 m)   Wt 111.1 kg   BMI 40.77 kg/m   Physical Exam Vitals and nursing note reviewed.  Constitutional:      General: She is not in acute distress.    Appearance: She is well-developed. She is not diaphoretic.  HENT:     Head: Normocephalic and atraumatic.  Cardiovascular:     Rate and Rhythm: Normal rate and regular rhythm.     Heart sounds: No murmur heard. No friction rub. No gallop.   Pulmonary:     Effort: Pulmonary effort is normal. No respiratory distress.     Breath sounds:  Normal breath sounds. No wheezing.  Abdominal:     General: Bowel sounds are normal. There is no distension.     Palpations: Abdomen is soft.     Tenderness: There is abdominal tenderness in the left lower quadrant. There is no right CVA tenderness, left CVA tenderness, guarding or rebound.  Musculoskeletal:        General: Normal range of motion.     Cervical back: Normal range of motion and neck supple.  Skin:    General: Skin is warm and dry.  Neurological:     Mental Status: She is alert and oriented to person, place, and time.     ED Results / Procedures / Treatments   Labs (all labs ordered are listed, but only abnormal results are displayed) Labs Reviewed - No data to display  EKG None  Radiology No results found.  Procedures Procedures   Medications Ordered in ED Medications  sodium chloride 0.9 % bolus 1,000 mL (has no administration in time range)    ED Course  I have reviewed the triage vital signs and the nursing notes.  Pertinent labs & imaging results that were available during my care of the patient were reviewed by me and considered in my medical decision making (see chart for details).    MDM Rules/Calculators/A&P  Patient presenting here with complaints of left-sided abdominal pain that appears related to a 6 mm renal calculus at the left UVJ.  Patient's pain better controlled with medications here in the ER.  She has no evidence for infection in her urine and laboratory studies essentially unremarkable.  She will be discharged with pain medicine and as needed follow-up with urology if the stone has not passed in the next few days.  Final Clinical Impression(s) / ED Diagnoses Final diagnoses:  LLQ pain    Rx / DC Orders ED Discharge Orders    None       Veryl Speak, MD 03/30/21 [phone]

## 2021-03-29 NOTE — ED Triage Notes (Signed)
Pt arrives ambulatory with c/o lower left abd pain for approx 12 hours. Patient has hx of endometriosis and fibroid. Patient has tried multiple interventions to include aleve, warm bath, heating pad with no relief.

## 2021-03-30 ENCOUNTER — Emergency Department (HOSPITAL_BASED_OUTPATIENT_CLINIC_OR_DEPARTMENT_OTHER): Payer: 59

## 2021-03-30 LAB — COMPREHENSIVE METABOLIC PANEL
ALT: 12 U/L (ref 0–44)
AST: 12 U/L — ABNORMAL LOW (ref 15–41)
Albumin: 4.2 g/dL (ref 3.5–5.0)
Alkaline Phosphatase: 91 U/L (ref 38–126)
Anion gap: 11 (ref 5–15)
BUN: 9 mg/dL (ref 6–20)
CO2: 24 mmol/L (ref 22–32)
Calcium: 9.5 mg/dL (ref 8.9–10.3)
Chloride: 103 mmol/L (ref 98–111)
Creatinine, Ser: 1.11 mg/dL — ABNORMAL HIGH (ref 0.44–1.00)
GFR, Estimated: >60 mL/min (ref >60)
Glucose, Bld: 214 mg/dL — ABNORMAL HIGH (ref 70–99)
Potassium: 3.5 mmol/L (ref 3.5–5.1)
Sodium: 138 mmol/L (ref 135–145)
Total Bilirubin: 0.6 mg/dL (ref 0.3–1.2)
Total Protein: 7.4 g/dL (ref 6.5–8.1)

## 2021-03-30 LAB — URINALYSIS, ROUTINE W REFLEX MICROSCOPIC
Bilirubin Urine: NEGATIVE
Glucose, UA: NEGATIVE mg/dL
Ketones, ur: 15 mg/dL — AB
Leukocytes,Ua: NEGATIVE
Nitrite: NEGATIVE
RBC / HPF: >50 RBC/hpf — ABNORMAL HIGH (ref 0–5)
Specific Gravity, Urine: 1.022 (ref 1.005–1.030)
pH: 6.5 (ref 5.0–8.0)

## 2021-03-30 LAB — CBC WITH DIFFERENTIAL/PLATELET
Abs Immature Granulocytes: 0.04 10*3/uL (ref 0.00–0.07)
Basophils Absolute: 0 10*3/uL (ref 0.0–0.1)
Basophils Relative: 0 %
Eosinophils Absolute: 0 10*3/uL (ref 0.0–0.5)
Eosinophils Relative: 0 %
HCT: 41.7 % (ref 36.0–46.0)
Hemoglobin: 13.7 g/dL (ref 12.0–15.0)
Immature Granulocytes: 0 %
Lymphocytes Relative: 8 %
Lymphs Abs: 1.2 10*3/uL (ref 0.7–4.0)
MCH: 27.6 pg (ref 26.0–34.0)
MCHC: 32.9 g/dL (ref 30.0–36.0)
MCV: 83.9 fL (ref 80.0–100.0)
Monocytes Absolute: 0.9 10*3/uL (ref 0.1–1.0)
Monocytes Relative: 6 %
Neutro Abs: 11.8 10*3/uL — ABNORMAL HIGH (ref 1.7–7.7)
Neutrophils Relative %: 86 %
Platelets: 355 10*3/uL (ref 150–400)
RBC: 4.97 MIL/uL (ref 3.87–5.11)
RDW: 13.5 % (ref 11.5–15.5)
WBC: 13.9 10*3/uL — ABNORMAL HIGH (ref 4.0–10.5)
nRBC: 0 % (ref 0.0–0.2)

## 2021-03-30 LAB — PREGNANCY, URINE: Preg Test, Ur: NEGATIVE

## 2021-03-30 MED ORDER — MORPHINE SULFATE (PF) 4 MG/ML IV SOLN
4.0000 mg | Freq: Once | INTRAVENOUS | Status: AC
  Administered 2021-03-30: 4 mg via INTRAVENOUS
  Filled 2021-03-30: qty 1

## 2021-03-30 MED ORDER — OXYCODONE-ACETAMINOPHEN 5-325 MG PO TABS: 1.0000 | ORAL_TABLET | Freq: Four times a day (QID) | ORAL | 0 refills | Status: DC | PRN

## 2021-03-30 MED ORDER — IOHEXOL 300 MG/ML  SOLN
100.0000 mL | Freq: Once | INTRAMUSCULAR | Status: AC | PRN
  Administered 2021-03-30: 100 mL via INTRAVENOUS

## 2021-03-30 MED ORDER — KETOROLAC TROMETHAMINE 30 MG/ML IJ SOLN
30.0000 mg | Freq: Once | INTRAMUSCULAR | Status: AC
  Administered 2021-03-30: 30 mg via INTRAVENOUS
  Filled 2021-03-30: qty 1

## 2021-03-30 MED ORDER — ONDANSETRON HCL 4 MG/2ML IJ SOLN
4.0000 mg | Freq: Once | INTRAMUSCULAR | Status: AC
  Administered 2021-03-30: 4 mg via INTRAVENOUS
  Filled 2021-03-30: qty 2

## 2021-03-30 NOTE — Discharge Instructions (Signed)
Begin taking Percocet as prescribed as needed for pain.  Follow-up with urology if your symptoms or not improving in the next 3 days.  The contact information for [facility] has been provided in this discharge summary for you to call and make these arrangements.  Return to the emergency department if you develop worsening pain, high fevers, or other new and concerning symptoms.

## 2021-03-30 NOTE — ED Notes (Signed)
Patient ambulatory to restroom at this time. 

## 2021-03-30 NOTE — ED Notes (Signed)
Patient transported to CT at this time. 

## 2021-04-22 ENCOUNTER — Other Ambulatory Visit: Payer: Self-pay

## 2021-04-22 ENCOUNTER — Ambulatory Visit (INDEPENDENT_AMBULATORY_CARE_PROVIDER_SITE_OTHER): Payer: 59

## 2021-04-22 DIAGNOSIS — Z789 Other specified health status: Secondary | ICD-10-CM

## 2021-04-22 MED ORDER — MEDROXYPROGESTERONE ACETATE 150 MG/ML IM SUSP
150.0000 mg | Freq: Once | INTRAMUSCULAR | Status: AC
  Administered 2021-04-22: 150 mg via INTRAMUSCULAR

## 2021-04-22 NOTE — Progress Notes (Signed)
Per orders of Dr.Parker,injection of Depo Provera  given by Loralyn Freshwater. Patient tolerated injection well.

## 2021-04-25 ENCOUNTER — Other Ambulatory Visit: Payer: Self-pay | Admitting: Family Medicine

## 2021-04-27 ENCOUNTER — Ambulatory Visit: Payer: 59

## 2021-04-28 ENCOUNTER — Ambulatory Visit: Payer: 59 | Admitting: Obstetrics & Gynecology

## 2021-04-28 ENCOUNTER — Encounter: Payer: Self-pay | Admitting: Obstetrics & Gynecology

## 2021-05-04 ENCOUNTER — Telehealth: Payer: 59 | Admitting: Family Medicine

## 2021-05-12 ENCOUNTER — Other Ambulatory Visit: Payer: Self-pay

## 2021-05-12 ENCOUNTER — Ambulatory Visit (INDEPENDENT_AMBULATORY_CARE_PROVIDER_SITE_OTHER): Payer: 59 | Admitting: Family Medicine

## 2021-05-12 ENCOUNTER — Encounter: Payer: Self-pay | Admitting: Family Medicine

## 2021-05-12 VITALS — BP 126/74 | HR 89 | Temp 98.2°F | Ht 65.0 in | Wt 250.2 lb

## 2021-05-12 DIAGNOSIS — R519 Headache, unspecified: Secondary | ICD-10-CM | POA: Diagnosis not present

## 2021-05-12 DIAGNOSIS — E1169 Type 2 diabetes mellitus with other specified complication: Secondary | ICD-10-CM | POA: Diagnosis not present

## 2021-05-12 DIAGNOSIS — M25562 Pain in left knee: Secondary | ICD-10-CM | POA: Diagnosis not present

## 2021-05-12 DIAGNOSIS — G8929 Other chronic pain: Secondary | ICD-10-CM

## 2021-05-12 DIAGNOSIS — Z87828 Personal history of other (healed) physical injury and trauma: Secondary | ICD-10-CM | POA: Diagnosis not present

## 2021-05-12 NOTE — Assessment & Plan Note (Addendum)
Last A1c was 7.8.  We will continue Ozempic 0.5 mg weekly and we will recheck again in a few months.

## 2021-05-12 NOTE — Assessment & Plan Note (Signed)
Her exam today is relatively normal however concern for progressively worsening headaches over the last month or so.  We will check MRI and refer back to neurology.  Discussed reasons to return to care.

## 2021-05-12 NOTE — Patient Instructions (Signed)
It was very nice to see you today!  We will check an MRI and place referral to see a neurologist for your headaches.  Your neurologic exam today is reassuring.  The spot on your hand and should improve over the next several months.  I will place a referral to orthopedics for your knee pain.  Take care, Dr [name]  PLEASE NOTE:  If you had any lab tests please let us know if you have not heard back within a few days. You may see your results on mychart before we have a chance to review them but we will give you a call once they are reviewed by [place]. If we ordered any referrals today, please let us know if you have not heard from their office within the next week.   Please try these tips to maintain a healthy lifestyle:   Eat at least 3 REAL meals and 1-2 snacks per day.  Aim for no more than 5 hours between eating.  If you eat breakfast, please do so within one hour of getting up.    Each meal should contain half fruits/vegetables, one quarter protein, and one quarter carbs (no bigger than a computer mouse)   Cut down on sweet beverages. This includes juice, soda, and sweet tea.     Drink at least 1 glass of water with each meal and aim for at least 8 glasses per day   Exercise at least 150 minutes every week.
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
## 2021-05-12 NOTE — Assessment & Plan Note (Signed)
Patient does not wish to have any further injections.  Will place referral to orthopedics per patient request.

## 2021-05-15 ENCOUNTER — Encounter (HOSPITAL_BASED_OUTPATIENT_CLINIC_OR_DEPARTMENT_OTHER): Payer: Self-pay | Admitting: *Deleted

## 2021-05-15 ENCOUNTER — Emergency Department (HOSPITAL_BASED_OUTPATIENT_CLINIC_OR_DEPARTMENT_OTHER): Payer: 59

## 2021-05-15 ENCOUNTER — Other Ambulatory Visit: Payer: Self-pay

## 2021-05-15 ENCOUNTER — Emergency Department (HOSPITAL_BASED_OUTPATIENT_CLINIC_OR_DEPARTMENT_OTHER)
Admission: EM | Admit: 2021-05-15 | Discharge: 2021-05-15 | Disposition: A | Discharge status: Home / Self Care | Payer: 59 | Attending: Emergency Medicine | Admitting: Emergency Medicine

## 2021-05-15 DIAGNOSIS — I10 Essential (primary) hypertension: Secondary | ICD-10-CM | POA: Insufficient documentation

## 2021-05-15 DIAGNOSIS — R413 Other amnesia: Secondary | ICD-10-CM | POA: Insufficient documentation

## 2021-05-15 DIAGNOSIS — R6889 Other general symptoms and signs: Secondary | ICD-10-CM

## 2021-05-15 DIAGNOSIS — R519 Headache, unspecified: Secondary | ICD-10-CM | POA: Insufficient documentation

## 2021-05-15 DIAGNOSIS — E119 Type 2 diabetes mellitus without complications: Secondary | ICD-10-CM | POA: Insufficient documentation

## 2021-05-15 LAB — PREGNANCY, URINE: Preg Test, Ur: NEGATIVE

## 2021-05-15 NOTE — ED Notes (Signed)
Patient transported to CT 

## 2021-05-15 NOTE — ED Notes (Signed)
PT HAS ELECTED TO REFUSE LABS.

## 2021-05-15 NOTE — ED Triage Notes (Signed)
Pt states she has had a headache and "forgetfullness" for about 3.5 weeks ago. States she lost her brother in Jan and it has been difficult for her. No dx of migraines.

## 2021-05-15 NOTE — ED Provider Notes (Signed)
[place] EMERGENCY DEPT Provider Note   CSN: [id number] Arrival date & time: 05/15/21  1121     History Chief Complaint  Patient presents with  . Headache  . Memory Loss    Peggy Schmidt is a 40 y.o. female.  HPI     3.5 weeks headaches, feeling forgetful, not self No numbness/weakness/visual changes/trouble talking or walking Describes loss of concentration/staring off blurriness at times Headaches 7-8/10, if take aspirin it goes down to 5, nothing seems to make them worse, just comes and goes spontaneously No nausea/vomiting Not worse with bright lights or loud sounds Glucose as been ok, taking medications as prescribed, walking exercising, drinking water  Past Medical History:  Diagnosis Date  . Abnormal uterine bleeding   . DM (diabetes mellitus), type 2 ([place])   . Hay fever   . Hypertension    pt states her blood pressure has been up and down for the last year  . Post concussion syndrome 2017    Patient Active Problem List   Diagnosis Date Noted  . DM (diabetes mellitus), type 2 ([place])   . Dyslipidemia associated with type 2 diabetes mellitus ([place]) 09/21/2018  . Morbid obesity ([place]) 09/21/2018  . Abnormal uterine bleeding (AUB) 06/11/2018  . Microcytic anemia 02/15/2018  . Hypertension associated with diabetes ([place]) 02/15/2018  . Chronic pain of left knee 02/15/2018  . History of head injury 02/15/2018    Past Surgical History:  Procedure Laterality Date  . ANKLE FRACTURE SURGERY       OB History    Gravida  0   Para  0   Term  0   Preterm  0   AB  0   Living  0     SAB  0   IAB  0   Ectopic  0   Multiple  0   Live Births  0           Family History  Problem Relation Age of Onset  . Hypertension Mother   . Diabetes Mother   . Depression Mother   . Hyperlipidemia Mother   . Hypertension Brother   . Hypertension Maternal Grandmother   . Diabetes Father   . Hypertension Father     Social History    Tobacco Use  . Smoking status: Never Smoker  . Smokeless tobacco: Never Used  Vaping Use  . Vaping Use: Never used  Substance Use Topics  . Alcohol use: No  . Drug use: No    Home Medications Prior to Admission medications   Medication Sig Start Date End Date Taking? Authorizing Provider  olmesartan-hydrochlorothiazide (BENICAR HCT) 20-12.5 MG tablet TAKE ONE TABLET BY MOUTH DAILY 11/04/20  Yes Vivi Barrack, MD  OZEMPIC, 0.25 OR 0.5 MG/DOSE, 2 MG/1.5ML SOPN INJECT 0.5 MG INTO THE SKIN ONCE A WEEK 04/26/21  Yes Vivi Barrack, MD  fluticasone [facility]) 50 MCG/ACT nasal spray Place 2 sprays into both nostrils daily. 05/09/19   Vivi Barrack, MD  oxyCODONE-acetaminophen (PERCOCET) 5-325 MG tablet Take 1-2 tablets by mouth every 6 (six) hours as needed. 03/30/21   Veryl Speak, MD    Allergies    Other  Review of Systems   Review of Systems  Constitutional: Negative for fever.  HENT: Negative for sore throat.   Eyes: Negative for visual disturbance.  Respiratory: Negative for cough and shortness of breath.   Cardiovascular: Negative for chest pain.  Gastrointestinal: Negative for abdominal pain, nausea and vomiting.  Genitourinary: Negative for difficulty urinating.  Musculoskeletal: Negative for back pain and neck pain.  Skin: Negative for rash.  Neurological: Positive for headaches. Negative for dizziness, syncope, facial asymmetry, speech difficulty, weakness and numbness.  Psychiatric/Behavioral: Positive for decreased concentration. Confusion: memoray problems, brain fog.    Physical Exam Updated Vital Signs BP 134/83 (BP Location: Left Arm)   Pulse 96   Temp 98.3 F (36.8 C) (Oral)   Resp 17   Ht 5\' 5"  (1.651 m)   Wt 113.4 kg   SpO2 100%   BMI 41.60 kg/m   Physical Exam Constitutional:      General: She is not in acute distress.    Appearance: Normal appearance. She is not ill-appearing.  HENT:     Head: Normocephalic and atraumatic.  Eyes:      General: No visual field deficit.    Extraocular Movements: Extraocular movements intact.     Conjunctiva/sclera: Conjunctivae normal.     Pupils: Pupils are equal, round, and reactive to light.  Cardiovascular:     Rate and Rhythm: Normal rate and regular rhythm.     Pulses: Normal pulses.     Heart sounds: Normal heart sounds. No murmur heard.   Pulmonary:     Effort: Pulmonary effort is normal. No respiratory distress.  Abdominal:     General: There is no distension.     Palpations: Abdomen is soft.     Tenderness: There is no abdominal tenderness.  Musculoskeletal:        General: No swelling or tenderness.     Cervical back: Normal range of motion.  Skin:    General: Skin is warm and dry.     Findings: No erythema or rash.  Neurological:     General: No focal deficit present.     Mental Status: She is alert and oriented to person, place, and time.     GCS: GCS eye subscore is 4. GCS verbal subscore is 5. GCS motor subscore is 6.     Cranial Nerves: No cranial nerve deficit, dysarthria or facial asymmetry.     Sensory: No sensory deficit.     Motor: No weakness or tremor.     Coordination: Coordination normal. Finger-Nose-Finger Test normal.     Gait: Gait normal.     ED Results / Procedures / Treatments   Labs (all labs ordered are listed, but only abnormal results are displayed) Labs Reviewed  PREGNANCY, URINE    EKG None  Radiology No results found.  Procedures Procedures   Medications Ordered in ED Medications - No data to display  ED Course  I have reviewed the triage vital signs and the nursing notes.  Pertinent labs & imaging results that were available during my care of the patient were reviewed by me and considered in my medical decision making (see chart for details).    MDM Rules/Calculators/A&P                          40 year old female with a history of diabetes, postconcussive syndrome, hypertension, hyperlipidemia, presents with  concern for 3 weeks of persistent headache, memory problems, brain fog.  Given duration of headache with headache worse at night, CT head was completed which showed no evidence of intracranial bleed or acute abnormalities.  Do not see signs on history or exam to suggest CVA.  History is not consistent with a transient global amnesia.  Discussed possibility of obtaining lab work, but she reports  she has had it done recently, and on chart review she has had it done during the time that she did have the symptoms.  Low suspicion that symptoms represent acute electrolyte abnormality.  Recommend outpatient follow-up with neurology, continue supportive care.  Final Clinical Impression(s) / ED Diagnoses Final diagnoses:  Generalized headaches  Forgetfulness    Rx / DC Orders ED Discharge Orders    None       Gareth Morgan, MD 05/17/21 1810

## 2021-05-20 ENCOUNTER — Other Ambulatory Visit: Payer: Self-pay | Admitting: *Deleted

## 2021-05-20 DIAGNOSIS — R519 Headache, unspecified: Secondary | ICD-10-CM

## 2021-06-01 ENCOUNTER — Telehealth: Payer: Self-pay

## 2021-06-01 NOTE — Telephone Encounter (Signed)
Patient is wanting to know when is the soonest that she can come in for her next depo shot. She is having a lot of break through bleeding. Please advise

## 2021-06-02 NOTE — Telephone Encounter (Signed)
Spoke with patient declined Rx at this time,appt scheduled.for July

## 2021-06-02 NOTE — Telephone Encounter (Signed)
She can come in on or after 7/5.  When she has had this in the past we have sent in estrace tablet. Would she like for [place] to send this in again?  Algis Greenhouse. [name], MD 06/02/2021 9:02 AM

## 2021-06-02 NOTE — Telephone Encounter (Signed)
Lvm for the pt to call the office back. 

## 2021-06-06 ENCOUNTER — Other Ambulatory Visit: Payer: 59

## 2021-06-16 ENCOUNTER — Ambulatory Visit: Payer: 59

## 2021-06-22 ENCOUNTER — Ambulatory Visit: Payer: 59

## 2021-06-23 ENCOUNTER — Ambulatory Visit: Payer: 59

## 2021-07-08 ENCOUNTER — Ambulatory Visit (INDEPENDENT_AMBULATORY_CARE_PROVIDER_SITE_OTHER): Payer: 59 | Admitting: *Deleted

## 2021-07-08 ENCOUNTER — Other Ambulatory Visit: Payer: Self-pay

## 2021-07-08 DIAGNOSIS — Z789 Other specified health status: Secondary | ICD-10-CM

## 2021-07-08 MED ORDER — MEDROXYPROGESTERONE ACETATE 150 MG/ML IM SUSP
150.0000 mg | Freq: Once | INTRAMUSCULAR | Status: AC
  Administered 2021-07-08: 150 mg via INTRAMUSCULAR

## 2021-07-08 NOTE — Progress Notes (Signed)
Patient present for depo provera  Inj Given on Left Deltoid  Patient tolerated well Advise to schedule appointment between  October 6 -October 20

## 2021-08-05 ENCOUNTER — Ambulatory Visit: Payer: 59 | Admitting: Neurology

## 2021-08-16 ENCOUNTER — Ambulatory Visit: Payer: 59 | Admitting: Neurology

## 2021-08-24 ENCOUNTER — Telehealth: Payer: Self-pay

## 2021-08-24 NOTE — Telephone Encounter (Signed)
error 

## 2021-08-28 ENCOUNTER — Encounter (HOSPITAL_BASED_OUTPATIENT_CLINIC_OR_DEPARTMENT_OTHER): Payer: Self-pay | Admitting: Emergency Medicine

## 2021-08-28 ENCOUNTER — Emergency Department (HOSPITAL_BASED_OUTPATIENT_CLINIC_OR_DEPARTMENT_OTHER)
Admission: EM | Admit: 2021-08-28 | Discharge: 2021-08-28 | Disposition: A | Discharge status: Home / Self Care | Payer: 59 | Attending: Emergency Medicine | Admitting: Emergency Medicine

## 2021-08-28 ENCOUNTER — Other Ambulatory Visit: Payer: Self-pay

## 2021-08-28 ENCOUNTER — Emergency Department (HOSPITAL_BASED_OUTPATIENT_CLINIC_OR_DEPARTMENT_OTHER): Payer: 59 | Admitting: Radiology

## 2021-08-28 DIAGNOSIS — I10 Essential (primary) hypertension: Secondary | ICD-10-CM | POA: Diagnosis not present

## 2021-08-28 DIAGNOSIS — M25531 Pain in right wrist: Secondary | ICD-10-CM | POA: Diagnosis present

## 2021-08-28 DIAGNOSIS — E119 Type 2 diabetes mellitus without complications: Secondary | ICD-10-CM | POA: Insufficient documentation

## 2021-08-28 DIAGNOSIS — M67833 Other specified disorders of tendon, right wrist: Secondary | ICD-10-CM | POA: Insufficient documentation

## 2021-08-28 DIAGNOSIS — Z79899 Other long term (current) drug therapy: Secondary | ICD-10-CM | POA: Insufficient documentation

## 2021-08-28 DIAGNOSIS — M778 Other enthesopathies, not elsewhere classified: Secondary | ICD-10-CM

## 2021-08-28 NOTE — Discharge Instructions (Addendum)
Your xray did not show any abnormalities. Your pain may be related to tendinitis of the wrist. Wear the splint during the daytime while at work specifically. While at home please rest, ice, and elevate your wrist to help with pain/inflammation.   I would recommend 600 mg Ibuprofen every 6-8 hours as needed for pain as well as 1,000 mg Tylenol every 8 hours as needed for pain. You can alternate the medication every 4 hours.   Follow up with Dr. Amalia Hailey for further evaluate of your wrist pain.   Return to the ED for any new/worsening symptoms.

## 2021-08-28 NOTE — ED Provider Notes (Signed)
[facility] EMERGENCY DEPT Provider Note   CSN: [id number] Arrival date & time: 08/28/21  1159     History Chief Complaint  Patient presents with   Wrist Pain    Peggy Schmidt is a 40 y.o. female with PMHx HTN and Diabetes who presents to the ED today with complaint of gradual onset, constant, achy, worsening, R wrist pain since March 2022.  Patient reports that in March she was diagnosed with a ganglion cyst to her wrist.  She went to [facility] medicine and had it drained as well as had a cortisone injection placed.  She states that since that time she has had pain to her wrist.  She states she has been dealing with it by taking 600 mg ibuprofen as needed however has not thought to follow-up with sports medicine for further evaluation.  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  She reports he was at work today when she had a worsening pain in her wrist and decided to come to the ED for further evaluation.  She denies any numbness/tingling in her fingers.  She states the pain is mostly located to the radial aspect of her wrist.  She has no other complaints at this time.  The history is provided by the patient and medical records.      Past Medical History:  Diagnosis Date   Abnormal uterine bleeding    DM (diabetes mellitus), type 2 ([place])    Hay fever    Hypertension    pt states her blood pressure has been up and down for the last year   Post concussion syndrome 2017    Patient Active Problem List   Diagnosis Date Noted   DM (diabetes mellitus), type 2 ([place])    Dyslipidemia associated with type 2 diabetes mellitus ([place]) 09/21/2018   Morbid obesity ([place]) 09/21/2018   Abnormal uterine bleeding (AUB) 06/11/2018   Microcytic anemia 02/15/2018   Hypertension associated with diabetes ([place]) 02/15/2018   Chronic pain of left knee 02/15/2018   History of head injury  02/15/2018    Past Surgical History:  Procedure Laterality Date   ANKLE FRACTURE SURGERY       OB History     Gravida  0   Para  0   Term  0   Preterm  0   AB  0   Living  0      SAB  0   IAB  0   Ectopic  0   Multiple  0   Live Births  0           Family History  Problem Relation Age of Onset   Hypertension Mother    Diabetes Mother    Depression Mother    Hyperlipidemia Mother    Hypertension Brother    Hypertension Maternal Grandmother    Diabetes Father    Hypertension Father     Social History   Tobacco Use   Smoking status: Never   Smokeless tobacco: Never  Vaping Use   Vaping Use: Never used  Substance Use Topics   Alcohol use: No   Drug use: No    Home Medications Prior to Admission medications   Medication Sig Start Date End Date Taking? Authorizing Provider  fluticasone (FLONASE) 50 MCG/ACT nasal spray Place 2 sprays into both nostrils daily. 05/09/19   Vivi Barrack, MD  olmesartan-hydrochlorothiazide (BENICAR HCT) 20-12.5 MG tablet TAKE ONE TABLET BY MOUTH DAILY 11/04/20   Vivi Barrack, MD  oxyCODONE-acetaminophen (PERCOCET) 5-325 MG tablet Take 1-2 tablets by mouth every 6 (six) hours as needed. 03/30/21   Veryl Speak, MD  OZEMPIC, 0.25 OR 0.5 MG/DOSE, 2 MG/1.5ML SOPN INJECT 0.5 MG INTO THE SKIN ONCE A WEEK 04/26/21   Vivi Barrack, MD    Allergies    Other  Review of Systems   Review of Systems  Constitutional:  Negative for chills and fever.  Musculoskeletal:  Positive for arthralgias.  Neurological:  Negative for weakness and numbness.  All other systems reviewed and are negative.  Physical Exam Updated Vital Signs BP 120/85 (BP Location: Right Arm)   Pulse (!) 108   Temp 98.7 F (37.1 C) (Oral)   Resp 19   Ht '5\' 5"'$  (1.651 m)   Wt 110.2 kg   SpO2 100%   BMI 40.44 kg/m   Physical Exam Vitals and nursing note reviewed.  Constitutional:      Appearance: She is not ill-appearing or diaphoretic.  HENT:      Head: Normocephalic and atraumatic.  Eyes:     Conjunctiva/sclera: Conjunctivae normal.  Cardiovascular:     Rate and Rhythm: Normal rate and regular rhythm.  Pulmonary:     Effort: Pulmonary effort is normal.     Breath sounds: Normal breath sounds.  Musculoskeletal:     Comments: No obvious swelling to R wrist compared to L. No overlying skin changes including redness or increased warmth. + TTP to the radial aspect of the R wrist without snuff box TTP. ROM intact however pt reports pain illicited with flexion/extension. Negative Phalen's and Tinels. + Finklestein test. 2+ radial pulse. Cap refill < 2 seconds to all digits.   Skin:    General: Skin is warm and dry.     Coloration: Skin is not jaundiced.  Neurological:     Mental Status: She is alert.    ED Results / Procedures / Treatments   Labs (all labs ordered are listed, but only abnormal results are displayed) Labs Reviewed - No data to display  EKG None  Radiology DG Wrist Complete Right  Result Date: 08/28/2021 CLINICAL DATA:  Right wrist pain. EXAM: RIGHT WRIST - COMPLETE 3+ VIEW COMPARISON:  None. FINDINGS: There is no evidence of fracture or dislocation. There is no evidence of arthropathy or other focal bone abnormality. Soft tissues are unremarkable. IMPRESSION: Negative. Electronically Signed   By: Fidela Salisbury M.D.   On: 08/28/2021 13:04    Procedures Procedures   Medications Ordered in ED Medications - No data to display  ED Course  I have reviewed the triage vital signs and the nursing notes.  Pertinent labs & imaging results that were available during my care of the patient were reviewed by me and considered in my medical decision making (see chart for details).    MDM Rules/Calculators/A&P                           40 year old female who presents to the ED today with persistent right wrist pain secondary to recent ganglion cyst removal in March of this year.  Worsening pain today while at  work prompting ED visit.  On arrival patient is somewhat tachycardic however this has decreased while in the room.  Suspect it was taken out as she walked in the door.  She appears to be in no  acute distress.  She has no overlying skin changes to the right wrist however she is noted to have some tenderness palpation to the radial aspect with a positive Wynn [place].  Negative Phalen's and Tinel's.  I do suspect she likely has some tendinitis as she uses her hands quite frequently at work.  Given she had a procedure done with worsening pain we will plan for x-ray to evaluate for any abnormality.  If negative will provide thumb spica brace and rice therapy with patient with Ortho follow-up.  Xray negative at this time. Will discharge with thumb spica and ortho follow up. Pt advised to follow up with [facility] which she saw earlier for ganglion cyst. She is instructed to wear brace at work, Ibuprofen/Tylenol for pain, rest/ice/elevation. She is in agreement with plan and stable for discharge.   This note was prepared using Dragon voice recognition software and may include unintentional dictation errors due to the inherent limitations of voice recognition software.   Final Clinical Impression(s) / ED Diagnoses Final diagnoses:  Right wrist pain  Tendinitis of right wrist    Rx / DC Orders ED Discharge Orders     None        Discharge Instructions      Your xray did not show any abnormalities. Your pain may be related to tendinitis of the wrist. Wear the splint during the daytime while at work specifically. While at home please rest, ice, and elevate your wrist to help with pain/inflammation.   I would recommend 600 mg Ibuprofen every 6-8 hours as needed for pain as well as 1,000 mg Tylenol every 8 hours as needed for pain. You can alternate the medication every 4 hours.   Follow up with Dr. Amalia Hailey for further evaluate of your wrist pain.   Return to the ED for any new/worsening symptoms.         Eustaquio Maize, PA-C 08/28/21 1314    Lajean Saver, MD 08/28/21 [phone]

## 2021-08-28 NOTE — ED Triage Notes (Signed)
Pt arrives from home and c/o pain in her right wrist. Pt stated that she had a cyst removed and a Cortizone shot after the cyst was removed from her wrist . Procedure was sometime in April or May.

## 2021-09-05 ENCOUNTER — Other Ambulatory Visit: Payer: Self-pay | Admitting: Family Medicine

## 2021-09-13 ENCOUNTER — Other Ambulatory Visit: Payer: Self-pay

## 2021-09-13 ENCOUNTER — Ambulatory Visit (INDEPENDENT_AMBULATORY_CARE_PROVIDER_SITE_OTHER): Payer: 59 | Admitting: Family Medicine

## 2021-09-13 ENCOUNTER — Telehealth: Payer: Self-pay | Admitting: *Deleted

## 2021-09-13 ENCOUNTER — Encounter: Payer: Self-pay | Admitting: Family Medicine

## 2021-09-13 VITALS — BP 111/74 | HR 96 | Temp 98.1°F | Ht 65.0 in | Wt 245.8 lb

## 2021-09-13 DIAGNOSIS — L309 Dermatitis, unspecified: Secondary | ICD-10-CM

## 2021-09-13 DIAGNOSIS — E1169 Type 2 diabetes mellitus with other specified complication: Secondary | ICD-10-CM

## 2021-09-13 DIAGNOSIS — E1159 Type 2 diabetes mellitus with other circulatory complications: Secondary | ICD-10-CM | POA: Diagnosis not present

## 2021-09-13 DIAGNOSIS — M25562 Pain in left knee: Secondary | ICD-10-CM

## 2021-09-13 DIAGNOSIS — I152 Hypertension secondary to endocrine disorders: Secondary | ICD-10-CM

## 2021-09-13 DIAGNOSIS — G8929 Other chronic pain: Secondary | ICD-10-CM

## 2021-09-13 LAB — POCT GLYCOSYLATED HEMOGLOBIN (HGB A1C): Hemoglobin A1C: 7.7 % — AB (ref 4.0–5.6)

## 2021-09-13 MED ORDER — SEMAGLUTIDE (1 MG/DOSE) 4 MG/3ML ~~LOC~~ SOPN
1.0000 mg | PEN_INJECTOR | SUBCUTANEOUS | 3 refills | Status: DC
  Filled 2022-01-17: qty 3, 28d supply, fill #0
  Filled 2022-02-11: qty 3, 28d supply, fill #1

## 2021-09-13 MED ORDER — TRIAMCINOLONE ACETONIDE 0.5 % EX OINT: 1.0000 "application " | TOPICAL_OINTMENT | Freq: Two times a day (BID) | CUTANEOUS | 0 refills | Status: DC

## 2021-09-13 NOTE — Assessment & Plan Note (Signed)
A1c 7.8.  Increase Ozempic to 1 mg weekly.  Recheck again in 3 to 6 months.  She is working on lifestyle modifications.

## 2021-09-13 NOTE — Telephone Encounter (Signed)
PA (Key: [facility]) Rx #: [id number] Ozempic (1 MG/DOSE) 4MG /3ML pen-injectors Send today  Waiting for determination

## 2021-09-13 NOTE — Patient Instructions (Signed)
It was very nice to see you today!  We will increase Ozempic to 1 mg weekly.  Please try the triamcinolone for the rash on your hand.  I will see back in 3 to 6 months.  Please go back to see me sooner if needed.  Take care, Dr [name]  PLEASE NOTE:  If you had any lab tests please let us know if you have not heard back within a few days. You may see your results on mychart before we have a chance to review them but we will give you a call once they are reviewed by [place]. If we ordered any referrals today, please let us know if you have not heard from their office within the next week.   Please try these tips to maintain a healthy lifestyle:  Eat at least 3 REAL meals and 1-2 snacks per day.  Aim for no more than 5 hours between eating.  If you eat breakfast, please do so within one hour of getting up.   Each meal should contain half fruits/vegetables, one quarter protein, and one quarter carbs (no bigger than a computer mouse)  Cut down on sweet beverages. This includes juice, soda, and sweet tea.   Drink at least 1 glass of water with each meal and aim for at least 8 glasses per day  Exercise at least 150 minutes every week.

## 2021-09-13 NOTE — Assessment & Plan Note (Signed)
At goal on olmesartan-HCTZ 20-12.5mg  once daily.

## 2021-09-13 NOTE — Assessment & Plan Note (Signed)
Following with orthopedics.  She does have some hyperpigmentation to her left knee which is likely due to chronic effusion.  Recommended compression sleeve to the area.

## 2021-09-13 NOTE — Addendum Note (Signed)
Addended by: Betti Cruz on: 09/13/2021 01:34 PM   Modules accepted: Orders

## 2021-09-13 NOTE — Progress Notes (Signed)
   Peggy Schmidt is a 40 y.o. female who presents today for an office visit.  Assessment/Plan:  New/Acute Problems: Rash Consistent with eczema.  Possibly contact dermatitis.  Start topical triamcinolone.  Chronic Problems Addressed Today: DM (diabetes mellitus), type 2 ([facility]) A1c 7.8.  Increase Ozempic to 1 mg weekly.  Recheck again in 3 to 6 months.  She is working on lifestyle modifications.  Chronic pain of left knee Following with orthopedics.  She does have some hyperpigmentation to her left knee which is likely due to chronic effusion.  Recommended compression sleeve to the area.  Hypertension associated with diabetes ([place]) At goal on olmesartan-HCTZ 20-12.5mg  once daily.      Subjective:  HPI:  There is a recurring rash on her right hand and a discoloration with usage of a gel injection in her left knee. She has been following with sports medicine for pain in her left knee. The gel treatment has not worked in alleviating the pain, but the skin has darkened noticeably. A knee brace was provided, but the size was too large to be of any use.  The rash appeared around 2 weeks ago. She denies having issues with eczema in the past, or reactions to skincare products or soaps. Nothing like this before. No history of eczema.   She states that she has been eating more recently, but this may be contributed to by recent stressors in her life. However, she has been trying to make an effort to exercise more by walking in the park.       Objective:  Physical Exam: BP 111/74   Pulse 96   Temp 98.1 F (36.7 C) (Temporal)   Ht 5\' 5"  (1.651 m)   Wt 245 lb 12.8 oz (111.5 kg)   LMP  (LMP Unknown)   SpO2 97%   BMI 40.90 kg/m   Gen: No acute distress, resting comfortably CV: Regular rate and rhythm with no murmurs appreciated Pulm: Normal work of breathing, clear to auscultation bilaterally with no crackles, wheezes, or rhonchi Skin: Erythematous rash on dorsal aspect of right  hand. Neuro: Grossly normal, moves all extremities Psych: Normal affect and thought content      I,Jordan Kelly,acting as a scribe for Dimas Chyle, MD.,have documented all relevant documentation on the behalf of Dimas Chyle, MD,as directed by  Dimas Chyle, MD while in the presence of Dimas Chyle, MD.  I, Dimas Chyle, MD, have reviewed all documentation for this visit. The documentation on 09/13/21 for the exam, diagnosis, procedures, and orders are all accurate and complete.  Algis Greenhouse. [name], MD 09/13/2021 12:00 PM

## 2021-09-16 NOTE — Telephone Encounter (Signed)
Prior Authorization is not required for this medication dosage form and strength at the quantity and days supply requested.

## 2021-09-26 ENCOUNTER — Emergency Department (HOSPITAL_BASED_OUTPATIENT_CLINIC_OR_DEPARTMENT_OTHER): Payer: 59

## 2021-09-26 ENCOUNTER — Other Ambulatory Visit: Payer: Self-pay

## 2021-09-26 ENCOUNTER — Encounter (HOSPITAL_BASED_OUTPATIENT_CLINIC_OR_DEPARTMENT_OTHER): Payer: Self-pay

## 2021-09-26 ENCOUNTER — Emergency Department (HOSPITAL_BASED_OUTPATIENT_CLINIC_OR_DEPARTMENT_OTHER)
Admission: EM | Admit: 2021-09-26 | Discharge: 2021-09-27 | Disposition: A | Discharge status: Home / Self Care | Payer: 59 | Attending: Emergency Medicine | Admitting: Emergency Medicine

## 2021-09-26 DIAGNOSIS — R109 Unspecified abdominal pain: Secondary | ICD-10-CM

## 2021-09-26 DIAGNOSIS — Z79899 Other long term (current) drug therapy: Secondary | ICD-10-CM | POA: Diagnosis not present

## 2021-09-26 DIAGNOSIS — E119 Type 2 diabetes mellitus without complications: Secondary | ICD-10-CM | POA: Insufficient documentation

## 2021-09-26 DIAGNOSIS — R1011 Right upper quadrant pain: Secondary | ICD-10-CM | POA: Diagnosis not present

## 2021-09-26 DIAGNOSIS — M25562 Pain in left knee: Secondary | ICD-10-CM | POA: Insufficient documentation

## 2021-09-26 DIAGNOSIS — I10 Essential (primary) hypertension: Secondary | ICD-10-CM | POA: Diagnosis not present

## 2021-09-26 DIAGNOSIS — R1013 Epigastric pain: Secondary | ICD-10-CM | POA: Diagnosis present

## 2021-09-26 LAB — COMPREHENSIVE METABOLIC PANEL
ALT: 12 U/L (ref 0–44)
AST: 19 U/L (ref 15–41)
Albumin: 4.2 g/dL (ref 3.5–5.0)
Alkaline Phosphatase: 77 U/L (ref 38–126)
Anion gap: 10 (ref 5–15)
BUN: 12 mg/dL (ref 6–20)
CO2: 28 mmol/L (ref 22–32)
Calcium: 9.6 mg/dL (ref 8.9–10.3)
Chloride: 100 mmol/L (ref 98–111)
Creatinine, Ser: 0.7 mg/dL (ref 0.44–1.00)
GFR, Estimated: >60 mL/min (ref >60)
Glucose, Bld: 114 mg/dL — ABNORMAL HIGH (ref 70–99)
Potassium: 3.7 mmol/L (ref 3.5–5.1)
Sodium: 138 mmol/L (ref 135–145)
Total Bilirubin: 0.6 mg/dL (ref 0.3–1.2)
Total Protein: 7.5 g/dL (ref 6.5–8.1)

## 2021-09-26 LAB — CBC WITH DIFFERENTIAL/PLATELET
Abs Immature Granulocytes: 0.06 10*3/uL (ref 0.00–0.07)
Basophils Absolute: 0.1 10*3/uL (ref 0.0–0.1)
Basophils Relative: 0 %
Eosinophils Absolute: 0.2 10*3/uL (ref 0.0–0.5)
Eosinophils Relative: 1 %
HCT: 40 % (ref 36.0–46.0)
Hemoglobin: 13 g/dL (ref 12.0–15.0)
Immature Granulocytes: 0 %
Lymphocytes Relative: 27 %
Lymphs Abs: 3.7 10*3/uL (ref 0.7–4.0)
MCH: 27.4 pg (ref 26.0–34.0)
MCHC: 32.5 g/dL (ref 30.0–36.0)
MCV: 84.4 fL (ref 80.0–100.0)
Monocytes Absolute: 1.1 10*3/uL — ABNORMAL HIGH (ref 0.1–1.0)
Monocytes Relative: 8 %
Neutro Abs: 8.9 10*3/uL — ABNORMAL HIGH (ref 1.7–7.7)
Neutrophils Relative %: 64 %
Platelets: 369 10*3/uL (ref 150–400)
RBC: 4.74 MIL/uL (ref 3.87–5.11)
RDW: 13.6 % (ref 11.5–15.5)
WBC: 14 10*3/uL — ABNORMAL HIGH (ref 4.0–10.5)
nRBC: 0 % (ref 0.0–0.2)

## 2021-09-26 LAB — LIPASE, BLOOD: Lipase: 28 U/L (ref 11–51)

## 2021-09-26 MED ORDER — ALUM & MAG HYDROXIDE-SIMETH 200-200-20 MG/5ML PO SUSP
30.0000 mL | Freq: Once | ORAL | Status: AC
  Administered 2021-09-26: 30 mL via ORAL
  Filled 2021-09-26: qty 30

## 2021-09-26 MED ORDER — SODIUM CHLORIDE 0.9 % IV BOLUS
1000.0000 mL | Freq: Once | INTRAVENOUS | Status: AC
  Administered 2021-09-26: 1000 mL via INTRAVENOUS

## 2021-09-26 NOTE — ED Triage Notes (Signed)
Pt is present for RUQ abd pain x two days. Pt has also had N/V but last episode of emesis was two days ago. Pt states she is still having the abd pain and the pain is worse when she is lying flat.   Pt also c/o recurrent left knee pain. Pt has seen an orthopedic. Pt recently received had three gel shots in her knee and still having left knee pain.

## 2021-09-26 NOTE — ED Provider Notes (Signed)
[facility] EMERGENCY DEPT Provider Note   CSN: [id number] Arrival date & time: 09/26/21  2106     History Chief Complaint  Patient presents with   Abdominal Pain   Knee Pain    Peggy Schmidt is a 40 y.o. female.  Pt presents to the ED today with abdominal pain.  She has had epigastric abd pain for the last 2 days.  Pt has had some n/v as well.  Pt also complains of left knee pain.  She has had knee pain for several months.  She has had gel injections which have not helped.  Pt denies f/c.      Past Medical History:  Diagnosis Date   Abnormal uterine bleeding    DM (diabetes mellitus), type 2 ([place])    Hay fever    Hypertension    pt states her blood pressure has been up and down for the last year   Post concussion syndrome 2017    Patient Active Problem List   Diagnosis Date Noted   DM (diabetes mellitus), type 2 ([place])    Dyslipidemia associated with type 2 diabetes mellitus ([place]) 09/21/2018   Morbid obesity ([place]) 09/21/2018   Abnormal uterine bleeding (AUB) 06/11/2018   Microcytic anemia 02/15/2018   Hypertension associated with diabetes ([place]) 02/15/2018   Chronic pain of left knee 02/15/2018   History of head injury 02/15/2018    Past Surgical History:  Procedure Laterality Date   ANKLE FRACTURE SURGERY       OB History     Gravida  0   Para  0   Term  0   Preterm  0   AB  0   Living  0      SAB  0   IAB  0   Ectopic  0   Multiple  0   Live Births  0           Family History  Problem Relation Age of Onset   Hypertension Mother    Diabetes Mother    Depression Mother    Hyperlipidemia Mother    Hypertension Brother    Hypertension Maternal Grandmother    Diabetes Father    Hypertension Father     Social History   Tobacco Use   Smoking status: Never   Smokeless tobacco: Never  Vaping Use   Vaping Use: Never used  Substance Use Topics   Alcohol use: No   Drug use: No    Home Medications Prior to  Admission medications   Medication Sig Start Date End Date Taking? Authorizing Provider  fluticasone (FLONASE) 50 MCG/ACT nasal spray Place 2 sprays into both nostrils daily. 05/09/19   Vivi Barrack, MD  olmesartan-hydrochlorothiazide [facility] HCT) 20-12.5 MG tablet TAKE ONE TABLET BY MOUTH DAILY 11/04/20   Vivi Barrack, MD  Semaglutide, 1 MG/DOSE, 4 MG/3ML SOPN Inject 1 mg as directed once a week. 09/13/21   Vivi Barrack, MD  triamcinolone ointment (KENALOG) 0.5 % Apply 1 application topically 2 (two) times daily. 09/13/21   Vivi Barrack, MD    Allergies    Other  Review of Systems   Review of Systems  Gastrointestinal:  Positive for abdominal pain, nausea and vomiting.  Musculoskeletal:        Left knee pain  All other systems reviewed and are negative.  Physical Exam Updated Vital Signs BP 124/74 (BP Location: Right Arm)   Pulse 83   Temp 98.6 F (37 C) (Oral)  Resp 20   Ht 5\' 5"  (1.651 m)   Wt 110.2 kg   LMP  (LMP Unknown)   SpO2 100%   BMI 40.44 kg/m   Physical Exam Vitals and nursing note reviewed.  Constitutional:      Appearance: She is well-developed.  HENT:     Head: Normocephalic and atraumatic.     Mouth/Throat:     Mouth: Mucous membranes are moist.     Pharynx: Oropharynx is clear.  Eyes:     Extraocular Movements: Extraocular movements intact.     Pupils: Pupils are equal, round, and reactive to light.  Cardiovascular:     Rate and Rhythm: Normal rate and regular rhythm.     Heart sounds: Normal heart sounds.  Pulmonary:     Effort: Pulmonary effort is normal.     Breath sounds: Normal breath sounds.  Abdominal:     General: Abdomen is flat. Bowel sounds are normal.     Palpations: Abdomen is soft.     Tenderness: There is abdominal tenderness in the right upper quadrant and epigastric area.  Musculoskeletal:       Legs:  Skin:    General: Skin is warm and dry.     Capillary Refill: Capillary refill takes less than 2 seconds.   Neurological:     General: No focal deficit present.     Mental Status: She is alert and oriented to person, place, and time.  Psychiatric:        Mood and Affect: Mood normal.        Behavior: Behavior normal.    ED Results / Procedures / Treatments   Labs (all labs ordered are listed, but only abnormal results are displayed) Labs Reviewed  CBC WITH DIFFERENTIAL/PLATELET  COMPREHENSIVE METABOLIC PANEL  LIPASE, BLOOD  URINALYSIS, ROUTINE W REFLEX MICROSCOPIC  PREGNANCY, URINE    EKG None  Radiology No results found.  Procedures Procedures   Medications Ordered in ED Medications  sodium chloride 0.9 % bolus 1,000 mL (1,000 mLs Intravenous New Bag/Given 09/26/21 2306)  alum & mag hydroxide-simeth (MAALOX/MYLANTA) 200-200-20 MG/5ML suspension 30 mL (30 mLs Oral Given 09/26/21 2237)    ED Course  I have reviewed the triage vital signs and the nursing notes.  Pertinent labs & imaging results that were available during my care of the patient were reviewed by me and considered in my medical decision making (see chart for details).    MDM Rules/Calculators/A&P                           Labs and GB US pending at shift change.  Pt signed out to Dr. Dina Rich. Final Clinical Impression(s) / ED Diagnoses Final diagnoses:  Abdominal pain    Rx / DC Orders ED Discharge Orders     None        Isla Pence, MD 09/26/21 2314

## 2021-09-26 NOTE — ED Notes (Signed)
US at the bedside

## 2021-09-27 LAB — URINALYSIS, ROUTINE W REFLEX MICROSCOPIC
Bilirubin Urine: NEGATIVE
Glucose, UA: NEGATIVE mg/dL
Hgb urine dipstick: NEGATIVE
Ketones, ur: NEGATIVE mg/dL
Nitrite: NEGATIVE
Protein, ur: NEGATIVE mg/dL
Specific Gravity, Urine: 1.016 (ref 1.005–1.030)
pH: 7 (ref 5.0–8.0)

## 2021-09-27 LAB — PREGNANCY, URINE: Preg Test, Ur: NEGATIVE

## 2021-09-27 MED ORDER — OMEPRAZOLE 20 MG PO CPDR: 20.0000 mg | DELAYED_RELEASE_CAPSULE | Freq: Every day | ORAL | 0 refills | Status: DC

## 2021-09-27 NOTE — ED Notes (Signed)
Pt tolerated PO fluids

## 2021-09-27 NOTE — ED Notes (Signed)
Pt verbalizes understanding of discharge instructions. Opportunity for questioning and answers were provided. [name] removed by staff, pt discharged from ED to home. Educated to pick up Rx.  

## 2021-09-27 NOTE — ED Provider Notes (Signed)
Patient signed out pending lab work and right upper quadrant ultrasound.  Lab work independently reviewed.  No significant metabolic derangements, LFT abnormalities, normal lipase.  Right upper quadrant ultrasound is negative and without any specific gallbladder pathology.  On recheck, patient is tolerating fluids.  She continues to endorse some upper abdominal pain.  Will trial omeprazole to cover for gastritis or GERD.  Patient stated understanding.  Follow-up with primary physician recommended.   Physical Exam  BP 123/80   Pulse 84   Temp 98.6 F (37 C) (Oral)   Resp 17   Ht 1.651 m (5\' 5" )   Wt 110.2 kg   LMP  (LMP Unknown)   SpO2 100%   BMI 40.44 kg/m   ED Course/Procedures     Procedures  MDM   Problem List Items Addressed This Visit   None Visit Diagnoses     Abdominal pain       Relevant Orders   US Abdomen Limited RUQ (LIVER/GB) (Completed)             Chevon Laufer, Barbette Hair, MD 09/27/21 [phone]

## 2021-09-27 NOTE — Discharge Instructions (Addendum)
Your work-up is reassuring.  Start omeprazole to see if this helps.  Follow-up with your primary doctor for ongoing symptoms.

## 2021-09-29 ENCOUNTER — Ambulatory Visit (INDEPENDENT_AMBULATORY_CARE_PROVIDER_SITE_OTHER): Payer: 59 | Admitting: *Deleted

## 2021-09-29 ENCOUNTER — Other Ambulatory Visit: Payer: Self-pay

## 2021-09-29 DIAGNOSIS — Z789 Other specified health status: Secondary | ICD-10-CM | POA: Diagnosis not present

## 2021-09-29 MED ORDER — MEDROXYPROGESTERONE ACETATE 150 MG/ML IM SUSP
150.0000 mg | Freq: Once | INTRAMUSCULAR | Status: AC
  Administered 2021-09-29: 150 mg via INTRAMUSCULAR

## 2021-09-29 NOTE — Progress Notes (Signed)
Date last pap: 01/13/2020. Side Effects if any: None Serum HCG indicated?N/A Depo-Provera 150 mg IM given by: Pamala Hurry. Next appointment due December 27-Jan 11,2023.  Patient tolerated well

## 2021-09-30 NOTE — Progress Notes (Signed)
I, Peggy Schmidt, LAT, ATC acting as a scribe for Peggy Leader, MD.  Peggy Schmidt is a 40 y.o. female who presents to [place] at [facility] today for f/u of L knee pain.  She was last seen by Dr. Georgina Snell on 03/03/21 for R wrist pain and had been seen previously for chronic B knee pain.  She was previously authorized for knee gel injections but that authorization expired on 06/01/21.  Today, pt reports L knee pain continues . Pt was seen at [facility] and completed the Synvisc series in July, w/ no improvement in her knee pain. Pt notes mechanical symptoms and swelling w/ activity. Pt reports she had a L knee MRI ordered by Dr. Alfonso Ramus that only showed some djd changes.   Diagnostic testing: R and L knee XR- 11/17/20  Pertinent review of systems: No fevers or chills  Relevant historical information: Hypertension and diabetes.  Obesity.   Exam:  BP 118/76   Pulse 86   Ht 5\' 5"  (1.651 m)   Wt 247 lb 3.2 oz (112.1 kg)   LMP  (LMP Unknown)   SpO2 96%   BMI 41.14 kg/m  General: Well Developed, well nourished, and in no acute distress.   MSK: Left knee normal-appearing Normal motion with crepitation.  Stable ligamentous exam    Lab and Radiology Results  EXAM: MRI OF THE LEFT KNEE WITHOUT CONTRAST   TECHNIQUE: Multiplanar, multisequence MR imaging of the knee was performed. No intravenous contrast was administered.   COMPARISON:  Radiographs dated 09/05/2018   FINDINGS: MENISCI   Medial meniscus:  Normal.   Lateral meniscus:  Normal.   LIGAMENTS   Cruciates:  Normal.   Collaterals:  Normal.   CARTILAGE   Patellofemoral: Small focal area of fissuring of the articular cartilage of the inferolateral aspect of the patella.   Medial:  Normal.   Lateral: Small focal area of full-thickness cartilage loss of the posterior aspect of the lateral tibial plateau.   Joint: Trace joint effusion. Edema in the superolateral aspect of Hoffa's fat pad  adjacent to the lower pole of the patella consistent with an impingement phenomenon.   Popliteal Fossa:  No Baker cyst. Intact popliteus tendon.   Extensor Mechanism: Intact quadriceps tendon and patellar tendon. TT TG distance is 20 mm.   Bones:  Normal.   Other: None   IMPRESSION: 1. Abnormal TT TG distance of approximately 20 mm with slight lateral patellar subluxation. 2. Abnormal edema in the superolateral aspect of Hoffa's fat pad anterior to the lateral femoral condyle consistent with impingement on the fat pad. 3. Small areas of cartilage loss on the inferolateral aspect of the patella and on the posterior aspect of the lateral tibial plateau.     Electronically Signed   By: Lorriane Shire M.D.   On: 10/08/2018 08:12   I, Peggy Schmidt, personally (independently) visualized and performed the interpretation of the images attached in this note.     Assessment and Plan: 40 y.o. 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  Patient is not responding to typical treatment including prior steroid injection and history of hyaluronic acid injections July of this year.  Plan to readdress traditional conservative management techniques including weight loss and quad strengthening.  Plan to refer back to PT as her last trial of PT was July of last year.  She had 4 sessions which probably was not enough to really work.  Also reviewed quad strengthening exercises in clinic today with ATC.  Additional work on weight loss.  Recommended [place] [facility] weight and wellness.  Consider repeat steroid injection with me.  Recheck back in about 6 weeks or so.  We will get medical records from [place] including MRI report from this year.   PDMP not reviewed this encounter. Orders Placed This Encounter  Procedures   Ambulatory referral to Physical Therapy    Referral Priority:   Routine    Referral Type:    Physical Medicine    Referral Reason:   Specialty Services Required    Requested Specialty:   Physical Therapy    Number of Visits Requested:   1   No orders of the defined types were placed in this encounter.    Discussed warning signs or symptoms. Please see discharge instructions. Patient expresses understanding.   The above documentation has been reviewed and is accurate and complete Peggy Schmidt, M.D.

## 2021-10-01 ENCOUNTER — Other Ambulatory Visit: Payer: Self-pay

## 2021-10-01 ENCOUNTER — Ambulatory Visit (INDEPENDENT_AMBULATORY_CARE_PROVIDER_SITE_OTHER): Payer: 59 | Admitting: Family Medicine

## 2021-10-01 VITALS — BP 118/76 | HR 86 | Ht 65.0 in | Wt 247.2 lb

## 2021-10-01 DIAGNOSIS — M25562 Pain in left knee: Secondary | ICD-10-CM | POA: Diagnosis not present

## 2021-10-01 DIAGNOSIS — G8929 Other chronic pain: Secondary | ICD-10-CM

## 2021-10-01 NOTE — Patient Instructions (Addendum)
Thank you for coming in today.   I've referred you to Physical Therapy.  Let us know if you don't hear from them in one week.   Please complete the exercises that the athletic trainer went over with you:  View at [email or web address] using code: [facility] [place], [place], [place] [address] Phone: [phone]  We will obtain your records from [place] back in 2 month

## 2021-10-07 ENCOUNTER — Encounter: Payer: Self-pay | Admitting: Family Medicine

## 2021-10-08 NOTE — Telephone Encounter (Signed)
See nnote

## 2021-10-13 ENCOUNTER — Ambulatory Visit (INDEPENDENT_AMBULATORY_CARE_PROVIDER_SITE_OTHER): Payer: 59 | Admitting: Family Medicine

## 2021-10-13 ENCOUNTER — Encounter: Payer: Self-pay | Admitting: Family Medicine

## 2021-10-13 ENCOUNTER — Other Ambulatory Visit: Payer: Self-pay

## 2021-10-13 ENCOUNTER — Telehealth: Payer: Self-pay | Admitting: Family Medicine

## 2021-10-13 VITALS — BP 110/69 | HR 85 | Temp 97.8°F | Ht 65.0 in | Wt 247.0 lb

## 2021-10-13 DIAGNOSIS — E1159 Type 2 diabetes mellitus with other circulatory complications: Secondary | ICD-10-CM | POA: Diagnosis not present

## 2021-10-13 DIAGNOSIS — I152 Hypertension secondary to endocrine disorders: Secondary | ICD-10-CM | POA: Diagnosis not present

## 2021-10-13 DIAGNOSIS — E1169 Type 2 diabetes mellitus with other specified complication: Secondary | ICD-10-CM | POA: Diagnosis not present

## 2021-10-13 LAB — CBC WITH DIFFERENTIAL/PLATELET
Basophils Absolute: 0.1 10*3/uL (ref 0.0–0.1)
Basophils Relative: 1 % (ref 0.0–3.0)
Eosinophils Absolute: 0.1 10*3/uL (ref 0.0–0.7)
Eosinophils Relative: 1.5 % (ref 0.0–5.0)
HCT: 41.8 % (ref 36.0–46.0)
Hemoglobin: 13.7 g/dL (ref 12.0–15.0)
Lymphocytes Relative: 32.7 % (ref 12.0–46.0)
Lymphs Abs: 2.4 10*3/uL (ref 0.7–4.0)
MCHC: 32.9 g/dL (ref 30.0–36.0)
MCV: 83.8 fl (ref 78.0–100.0)
Monocytes Absolute: 0.5 10*3/uL (ref 0.1–1.0)
Monocytes Relative: 7.3 % (ref 3.0–12.0)
Neutro Abs: 4.2 10*3/uL (ref 1.4–7.7)
Neutrophils Relative %: 57.5 % (ref 43.0–77.0)
Platelets: 371 10*3/uL (ref 150.0–400.0)
RBC: 4.99 Mil/uL (ref 3.87–5.11)
RDW: 14.7 % (ref 11.5–15.5)
WBC: 7.3 10*3/uL (ref 4.0–10.5)

## 2021-10-13 LAB — COMPREHENSIVE METABOLIC PANEL
ALT: 11 U/L (ref 0–35)
AST: 12 U/L (ref 0–37)
Albumin: 4.5 g/dL (ref 3.5–5.2)
Alkaline Phosphatase: 99 U/L (ref 39–117)
BUN: 8 mg/dL (ref 6–23)
CO2: 28 mEq/L (ref 19–32)
Calcium: 9.7 mg/dL (ref 8.4–10.5)
Chloride: 104 mEq/L (ref 96–112)
Creatinine, Ser: 0.87 mg/dL (ref 0.40–1.20)
GFR: 84.51 mL/min (ref >60.00)
Glucose, Bld: 147 mg/dL — ABNORMAL HIGH (ref 70–99)
Potassium: 4 mEq/L (ref 3.5–5.1)
Sodium: 139 mEq/L (ref 135–145)
Total Bilirubin: 0.5 mg/dL (ref 0.2–1.2)
Total Protein: 7.5 g/dL (ref 6.0–8.3)

## 2021-10-13 NOTE — Telephone Encounter (Signed)
Pt calling to inquire if we have received the MRI's from [facility] for review. She stated she signed a release at her last visit.

## 2021-10-13 NOTE — Assessment & Plan Note (Signed)
At goal on olmesartan-HCTZ 20-12.5 once daily

## 2021-10-13 NOTE — Patient Instructions (Signed)
It was very nice to see you today!  We will recheck your blood work.  No other changes today.  Take care, Dr [name]  PLEASE NOTE:  If you had any lab tests please let us know if you have not heard back within a few days. You may see your results on mychart before we have a chance to review them but we will give you a call once they are reviewed by [place]. If we ordered any referrals today, please let us know if you have not heard from their office within the next week.   Please try these tips to maintain a healthy lifestyle:  Eat at least 3 REAL meals and 1-2 snacks per day.  Aim for no more than 5 hours between eating.  If you eat breakfast, please do so within one hour of getting up.   Each meal should contain half fruits/vegetables, one quarter protein, and one quarter carbs (no bigger than a computer mouse)  Cut down on sweet beverages. This includes juice, soda, and sweet tea.   Drink at least 1 glass of water with each meal and aim for at least 8 glasses per day  Exercise at least 150 minutes every week.

## 2021-10-13 NOTE — Progress Notes (Signed)
   Peggy Schmidt is a 40 y.o. female who presents today for an office visit.  Assessment/Plan:  New/Acute Problems: Leukocytosis Likely reactive.  Reassured patient.  Recheck labs today  Abdominal Schmidt Possibly mild gastritis.  Also possible mild gas pains. It is reassuring that symptoms have resolved.  We will continue with watchful waiting.  Chronic Problems Addressed Today: DM (diabetes mellitus), type 2 ([place]) Last A1c 7.8.  She is on Ozempic 1 mg weekly.  We will recheck A1c at her next office visit.  She has tolerating medications well.  Hypertension associated with diabetes ([place]) At goal on olmesartan-HCTZ 20-12.5 once daily    Subjective:  HPI:  Patient with the ED a few weeks ago.  Had abdominal Schmidt.  Work-up in the ED was negative.  She was concerned about elevated white blood cell count and positive leukocyte esterase in her urine.  Does not have any symptoms of UTI.  Abdominal Schmidt is resolved.  She states that she had had abdominal Schmidt which has since resolved.   Today she wants to have blood work done, having questions about WBC count and several other cell counts.        Objective:  Physical Exam: BP 110/69   Pulse 85   Temp 97.8 F (36.6 C) (Temporal)   Ht 5\' 5"  (1.651 m)   Wt 247 lb (112 kg)   LMP  (LMP Unknown)   SpO2 97%   BMI 41.10 kg/m   Gen: No acute distress, resting comfortably CV: Regular rate and rhythm with no murmurs appreciated Pulm: Normal work of breathing, clear to auscultation bilaterally with no crackles, wheezes, or rhonchi Neuro: Grossly normal, moves all extremities Psych: Normal affect and thought content      I,Peggy Schmidt,acting as a scribe for Peggy Chyle, MD.,have documented all relevant documentation on the behalf of Peggy Chyle, MD,as directed by  Peggy Chyle, MD while in the presence of Peggy Chyle, MD.  I, Peggy Chyle, MD, have reviewed all documentation for this visit. The documentation on 10/13/21 for the  exam, diagnosis, procedures, and orders are all accurate and complete.  Peggy Schmidt. Peggy Pain, MD 10/13/2021 10:36 AM

## 2021-10-13 NOTE — Assessment & Plan Note (Signed)
Last A1c 7.8.  She is on Ozempic 1 mg weekly.  We will recheck A1c at her next office visit.  She has tolerating medications well.

## 2021-10-14 ENCOUNTER — Ambulatory Visit: Payer: 59 | Admitting: Physical Therapy

## 2021-10-14 ENCOUNTER — Encounter: Payer: Self-pay | Admitting: Family Medicine

## 2021-10-14 ENCOUNTER — Encounter: Payer: Self-pay | Admitting: Physical Therapy

## 2021-10-14 ENCOUNTER — Telehealth: Payer: Self-pay | Admitting: Family Medicine

## 2021-10-14 DIAGNOSIS — G8929 Other chronic pain: Secondary | ICD-10-CM

## 2021-10-14 DIAGNOSIS — M25562 Pain in left knee: Secondary | ICD-10-CM

## 2021-10-14 NOTE — Progress Notes (Signed)
Please inform patient of the following:  Blood work back to normal. Do not need to do any further testing at this point.  Peggy Schmidt. [name], MD 10/14/2021 8:05 AM

## 2021-10-14 NOTE — Patient Instructions (Signed)
Access Code: [email or web address] URL: [email or web address] Date: 10/14/2021 Prepared by: Lyndee Hensen  Exercises Supine Quadricep Sets - 1 x daily - 1-2 sets - 10 reps Straight Leg Raise - 2 x daily - 2 sets - 10 reps Sidelying Hip Abduction - 1 x daily - 1-2 sets - 10 reps Sit to Stand - 1 x daily - 1 sets - 10 reps

## 2021-10-14 NOTE — Telephone Encounter (Signed)
Pt sent a MyChart message regarding her MRI from [place] so copy and pasted Dr. Clovis Riley response and replied to pt via [place].

## 2021-10-14 NOTE — Telephone Encounter (Signed)
Received medical records.  MRI of the left knee from May 20, 2021  Impression: Progressive area of partial-thickness cartilage loss with fissuring involving the inferior patella and medial femoral condyle. Prominent edema within the superior lateral aspect of Hoffa's fat pad, which can be seen in the setting of patellar tendon-lateral femoral condyle friction syndrome. Trace knee joint effusion with multiple small intra-articular loose bodies. Previously seen chondral defect of the lateral tibial plateau is no longer evident. Intact menisci.  Intact cruciate and collateral ligaments. Patella alta alignment.  MRI will be sent to scan   Patient did receive trial of hyaluronic acid injection (Synvisc) culminating on July 16, 2021.  Overall treated with meloxicam and Synvisc.  Discussed steroid injection and avoided that for now.

## 2021-10-14 NOTE — Telephone Encounter (Signed)
I received it recently.  The MRI shows some arthritis changes underneath the kneecap (chondromalacia patella) And changes on the front of the knee near the patella tendon (patellar tendon-lateral femoral condyle friction syndrome)  Additionally you do have some multiple small intra-articular loose bodies but fortunately you do have intact meniscus and ligaments. The kneecap sits a little high in the knee (patella alta) which can predispose you to pain in the front of the knee.  The typical treatment for this is quad strengthening which we have started on and potentially some targeted injections to the area of pain.  It looks like you are just getting started on physical therapy now.  Recommend checking back with me in about 4 to 6 weeks which should be long enough to know how will physical therapy has worked and we can proceed with next steps if needed at that point.

## 2021-10-15 ENCOUNTER — Ambulatory Visit: Payer: 59 | Admitting: Family Medicine

## 2021-10-15 ENCOUNTER — Encounter: Payer: Self-pay | Admitting: Family Medicine

## 2021-10-15 ENCOUNTER — Telehealth: Payer: Self-pay

## 2021-10-15 NOTE — Telephone Encounter (Signed)
Pt called needing a copy of vaccine records. Can records be printed out? Please Advise.

## 2021-10-15 NOTE — Progress Notes (Incomplete)
   Peggy Schmidt is a 40 y.o. female who presents today for an office visit.  Assessment/Plan:  New/Acute Problems: ***  Chronic Problems Addressed Today: No problem-specific Assessment & Plan notes found for this encounter.     Subjective:  HPI:  ***        Objective:  Physical Exam: There were no vitals taken for this visit.  Gen: No acute distress, resting comfortably*** CV: Regular rate and rhythm with no murmurs appreciated Pulm: Normal work of breathing, clear to auscultation bilaterally with no crackles, wheezes, or rhonchi Neuro: Grossly normal, moves all extremities Psych: Normal affect and thought content      I,Jordan Kelly,acting as a scribe for Dimas Chyle, MD.,have documented all relevant documentation on the behalf of Dimas Chyle, MD,as directed by  Dimas Chyle, MD while in the presence of Dimas Chyle, MD. *** Algis [place]. [name], MD 10/15/2021 7:55 AM

## 2021-10-15 NOTE — Telephone Encounter (Signed)
Patient aware records at front office ready to be pick up

## 2021-10-19 NOTE — Telephone Encounter (Signed)
Copy was given to patient

## 2021-10-21 ENCOUNTER — Ambulatory Visit: Payer: 59 | Admitting: Neurology

## 2021-10-25 ENCOUNTER — Other Ambulatory Visit: Payer: Self-pay

## 2021-10-25 ENCOUNTER — Ambulatory Visit: Payer: 59 | Admitting: Physical Therapy

## 2021-10-25 DIAGNOSIS — M25562 Pain in left knee: Secondary | ICD-10-CM

## 2021-10-25 DIAGNOSIS — G8929 Other chronic pain: Secondary | ICD-10-CM

## 2021-10-26 ENCOUNTER — Encounter: Payer: Self-pay | Admitting: Physical Therapy

## 2021-10-26 NOTE — Therapy (Addendum)
[facility] [address] [place], [place], [address] Phone: [phone]   Fax:  [phone]  Physical Therapy Treatment  Patient Details  Name: Peggy Schmidt MRN: [id number] Date of Birth: [birth year] Referring Provider (PT): [name]   Encounter Date: 10/25/2021   PT End of Session - 10/26/21 2129     Visit Number 2    Number of Visits 16    Date for PT Re-Evaluation 12/09/21    Authorization Type [employer]    PT Start Time 0803    PT Stop Time 3953    PT Time Calculation (min) 41 min    Activity Tolerance Patient tolerated treatment well    Behavior During Therapy [facility] for tasks assessed/performed             Past Medical History:  Diagnosis Date   Abnormal uterine bleeding    DM (diabetes mellitus), type 2 ([place])    Hay fever    Hypertension    pt states her blood pressure has been up and down for the last year   Post concussion syndrome 2017    Past Surgical History:  Procedure Laterality Date   ANKLE FRACTURE SURGERY      There were no vitals filed for this visit.   Subjective Assessment - 10/26/21 2128     Subjective Pt states ongoing soreness. Has been doing HEP, and tyring to stay off of her knee.    Patient Stated Goals decrearsed pain    Currently in Pain? Yes    Pain Score 7     Pain Location Knee    Pain Orientation Left    Pain Descriptors / Indicators Aching    Pain Type Chronic pain    Pain Onset More than a month ago    Pain Frequency Intermittent                               [facility] Adult PT Treatment/Exercise - 10/26/21 2127       Exercises   Exercises Knee/Hip      Knee/Hip Exercises: Stretches   Active Hamstring Stretch 2 reps;30 seconds;Left    Quad Stretch 2 reps;30 seconds;Left    Quad Stretch Limitations prone with strap      Knee/Hip Exercises: Aerobic   Recumbent Bike L1 x 5 min;      Knee/Hip Exercises: Seated   Sit to Sand 10 reps       Knee/Hip Exercises: Supine   Quad Sets 20 reps    Straight Leg Raises 10 reps;2 sets;Both      Knee/Hip Exercises: Sidelying   Hip ABduction 10 reps      Manual Therapy   Manual Therapy Taping    Kinesiotex Ligament Correction      Kinesiotix   Ligament Correction 2 I strips, 1 for patella tracking and 1 for patella tendon decompression                       PT Short Term Goals - 10/26/21 2056       PT SHORT TERM GOAL #1   Title Pt to be independent with initial HEP    Time 2    Period Weeks    Status New    Target Date 10/28/21               PT Long Term Goals - 10/26/21 2058  PT LONG TERM GOAL #1   Title Pt to be independent with final HEP    Time 8    Period Weeks    Status New    Target Date 12/09/21      PT LONG TERM GOAL #2   Title Pt to report decreased pain in L knee, to 0-3/10 with standing and walking activity.    Time 6    Period Weeks    Status New    Target Date 12/09/21      PT LONG TERM GOAL #3   Title Pt to demo quad and hip strength to be at least 4+/5 , and functional stability to be WNL for pt age.    Time 6    Period Weeks    Target Date 12/09/21      PT LONG TERM GOAL #4   Title Pt to report ability for walking for at least 30 min, with pain no greater than 3/10, to improve ability for community activity and exercise.    Time 8    Period Weeks    Status New    Target Date 12/09/21                   Plan - 10/26/21 2131     Clinical Impression Statement Pt with good ability for exercises done today. K-tape re-applied, due to help with pain relief last session. HEP updated and reviewed. Discussed continued work on quad and hip strengthening for pain relief. Plan to progress strength as tolerated.    Personal Factors and Comorbidities Time since onset of injury/illness/exacerbation    Examination-Activity Limitations Stand;Locomotion Level;Stairs;Squat    Examination-Participation Restrictions  Cleaning;Occupation;Community Activity;Shop    Stability/Clinical Decision Making Stable/Uncomplicated    Rehab Potential Good    PT Frequency 2x / week    PT Duration 6 weeks    PT Treatment/Interventions ADLs/Self Care Home Management;Cryotherapy;Electrical Stimulation;DME Instruction;Ultrasound;Iontophoresis 4mg /ml Dexamethasone;Moist Heat;Stair training;Functional mobility training;Therapeutic activities;Gait training;Therapeutic exercise;Balance training;Neuromuscular re-education;Patient/family education;Manual techniques;Passive range of motion;Dry needling;Taping;Spinal Manipulations;Joint Manipulations;Vasopneumatic Device    PT Home Exercise Plan [email or web address]    Consulted and Agree with Plan of Care Patient             Patient will benefit from skilled therapeutic intervention in order to improve the following deficits and impairments:  Pain, Increased muscle spasms, Decreased mobility, Decreased activity tolerance, Decreased strength  Visit Diagnosis: Chronic pain of left knee     Problem List Patient Active Problem List   Diagnosis Date Noted   DM (diabetes mellitus), type 2 ([place])    Dyslipidemia associated with type 2 diabetes mellitus ([place]) 09/21/2018   Morbid obesity ([place]) 09/21/2018   Abnormal uterine bleeding (AUB) 06/11/2018   Microcytic anemia 02/15/2018   Hypertension associated with diabetes ([place]) 02/15/2018   Chronic pain of left knee 02/15/2018   History of head injury 02/15/2018   Lyndee Hensen, PT, DPT 9:32 PM  10/26/21    [facility] [address] [place], [place], [address] Phone: [phone]   Fax:  [phone]  Name: Peggy Schmidt MRN: [id number] Date of Birth: 1981/10/08   PHYSICAL THERAPY DISCHARGE SUMMARY  Visits from Start of Care: 2 Plan: Patient agrees to discharge.  Patient goals were partially met. Patient is being discharged due to - not returning since last visit.     Lyndee Hensen, PT, DPT 3:43 PM  01/13/22

## 2021-10-26 NOTE — Therapy (Signed)
[facility] [address] [place], [place], [address] Phone: [phone]   Fax:  [phone]  Physical Therapy Evaluation  Patient Details  Name: Peggy Schmidt MRN: [id number] Date of Birth: 1981/03/18 Referring Provider (PT): [name]   Encounter Date: 10/14/2021   PT End of Session - 10/26/21 2047     Visit Number 1    Number of Visits 16    Date for PT Re-Evaluation 12/09/21    Authorization Type [employer]    PT Start Time 1215    PT Stop Time 1255    PT Time Calculation (min) 40 min    Activity Tolerance Patient tolerated treatment well    Behavior During Therapy [facility] for tasks assessed/performed             Past Medical History:  Diagnosis Date   Abnormal uterine bleeding    DM (diabetes mellitus), type 2 ([place])    Hay fever    Hypertension    pt states her blood pressure has been up and down for the last year   Post concussion syndrome 2017    Past Surgical History:  Procedure Laterality Date   ANKLE FRACTURE SURGERY      There were no vitals filed for this visit.    Subjective Assessment - 10/26/21 2041     Subjective Pt states L knee pain, swelling and popping. Pain ongoing for about 2 years, worsening. Pt works currently, but thinking about switching jobs soon. Likes to walk, for weight loss. Did have 3 gel injections in July , no relief. Increased pain with increased activity.    Limitations House hold activities;Walking;Standing;Lifting    Patient Stated Goals decrearsed pain    Currently in Pain? Yes    Pain Score 10-Worst pain ever    Pain Location Leg    Pain Orientation Left    Pain Descriptors / Indicators Aching    Pain Type Chronic pain    Pain Onset More than a month ago    Pain Frequency Intermittent    Aggravating Factors  walking, stairs    Pain Relieving Factors ice, heat, ointment    Effect of Pain on Daily Activities no pain at rest, but up to 10/10 at times with activity                 [facility] PT Assessment - 10/26/21 0001       Assessment   Medical Diagnosis L knee pain    Referring Provider (PT) [name]    Prior Therapy no      Precautions   Precautions None      Balance Screen   Has the patient fallen in the past 6 months No      Prior Function   Level of Independence Independent      Cognition   Overall Cognitive Status Within Functional Limits for tasks assessed      AROM   Overall AROM Comments KNees: WNL      Strength   Overall Strength Comments Knees: 4+/5, Hips: 4/5      Palpation   Palpation comment L knee: tenderness around patella inf and lateral, non tender patella tendon      Special Tests   Other special tests Poor patella tracking                        Objective measurements completed on examination: See above findings.  [facility] [facility] Adult PT Treatment/Exercise - 10/26/21 0001       Exercises   Exercises Knee/Hip      Knee/Hip Exercises: Seated   Sit to Sand 10 reps      Knee/Hip Exercises: Supine   Quad Sets 20 reps    Straight Leg Raises 10 reps      Knee/Hip Exercises: Sidelying   Hip ABduction 10 reps                     PT Education - 10/26/21 2047     Education Details PT POC, Exam findings, HEP    Person(s) Educated Patient    Methods Explanation;Demonstration;Tactile cues;Verbal cues;Handout    Comprehension Verbalized understanding;Returned demonstration;Verbal cues required;Tactile cues required;Need further instruction              PT Short Term Goals - 10/26/21 2056       PT SHORT TERM GOAL #1   Title Pt to be independent with initial HEP    Time 2    Period Weeks    Status New    Target Date 10/28/21               PT Long Term Goals - 10/26/21 2058       PT LONG TERM GOAL #1   Title Pt to be independent with final HEP    Time 8    Period Weeks    Status New    Target Date 12/09/21      PT LONG TERM GOAL #2   Title Pt to report  decreased pain in L knee, to 0-3/10 with standing and walking activity.    Time 6    Period Weeks    Status New    Target Date 12/09/21      PT LONG TERM GOAL #3   Title Pt to demo quad and hip strength to be at least 4+/5 , and functional stability to be WNL for pt age.    Time 6    Period Weeks    Target Date 12/09/21      PT LONG TERM GOAL #4   Title Pt to report ability for walking for at least 30 min, with pain no greater than 3/10, to improve ability for community activity and exercise.    Time 8    Period Weeks    Status New    Target Date 12/09/21                    Plan - 10/26/21 2113     Clinical Impression Statement Pt presents wtih primary complaint of increased pain in L knee. She has soreness around patella with palpation, and increased pain with standing and walking activity. She has strength and stability deficits for quad and hips. Pt with lack of effective HEP for chronic knee pain. She has decreased ability for full functional actvities due to pain and defiict. Pt to benefit from skilled PT to improve defiicts and pain.    Personal Factors and Comorbidities Time since onset of injury/illness/exacerbation    Examination-Activity Limitations Stand;Locomotion Level;Stairs;Squat    Examination-Participation Restrictions Cleaning;Occupation;Community Activity;Shop    Stability/Clinical Decision Making Stable/Uncomplicated    Clinical Decision Making Low    Rehab Potential Good    PT Frequency 2x / week    PT Duration 6 weeks    PT Treatment/Interventions ADLs/Self Care Home Management;Cryotherapy;Electrical Stimulation;DME Instruction;Ultrasound;Iontophoresis 4mg /ml Dexamethasone;Moist Heat;Stair training;Functional mobility training;Therapeutic activities;Gait training;Therapeutic exercise;Balance training;Neuromuscular re-education;Patient/family education;Manual  techniques;Passive range of motion;Dry needling;Taping;Spinal Manipulations;Joint  Manipulations;Vasopneumatic Device    PT Home Exercise Plan [id number]    Consulted and Agree with Plan of Care Patient             Patient will benefit from skilled therapeutic intervention in order to improve the following deficits and impairments:  Pain, Increased muscle spasms, Decreased mobility, Decreased activity tolerance, Decreased strength  Visit Diagnosis: Chronic pain of left knee     Problem List Patient Active Problem List   Diagnosis Date Noted   DM (diabetes mellitus), type 2 ([place])    Dyslipidemia associated with type 2 diabetes mellitus ([place]) 09/21/2018   Morbid obesity ([place]) 09/21/2018   Abnormal uterine bleeding (AUB) 06/11/2018   Microcytic anemia 02/15/2018   Hypertension associated with diabetes ([place]) 02/15/2018   Chronic pain of left knee 02/15/2018   History of head injury 02/15/2018   Lyndee Hensen, PT, DPT 9:25 PM  10/26/21    [facility] [address] [place], [place], [address] Phone: [phone]   Fax:  [phone]  Name: Peggy Schmidt MRN: [id number] Date of Birth: 09-04-1981

## 2021-10-28 ENCOUNTER — Encounter: Payer: 59 | Admitting: Physical Therapy

## 2021-10-28 ENCOUNTER — Encounter: Payer: Self-pay | Admitting: Family Medicine

## 2021-10-28 ENCOUNTER — Other Ambulatory Visit: Payer: Self-pay

## 2021-10-28 ENCOUNTER — Ambulatory Visit (INDEPENDENT_AMBULATORY_CARE_PROVIDER_SITE_OTHER): Payer: 59 | Admitting: Family Medicine

## 2021-10-28 NOTE — Progress Notes (Signed)
Patient was scheduled for consultation for possible referral to sleep studies.  She no longer needs this done.  She was not medically evaluated today.

## 2021-11-01 ENCOUNTER — Encounter: Payer: 59 | Admitting: Physical Therapy

## 2021-11-04 ENCOUNTER — Encounter (HOSPITAL_BASED_OUTPATIENT_CLINIC_OR_DEPARTMENT_OTHER): Payer: Self-pay | Admitting: Emergency Medicine

## 2021-11-04 ENCOUNTER — Other Ambulatory Visit: Payer: Self-pay

## 2021-11-04 ENCOUNTER — Emergency Department (HOSPITAL_BASED_OUTPATIENT_CLINIC_OR_DEPARTMENT_OTHER)
Admission: EM | Admit: 2021-11-04 | Discharge: 2021-11-04 | Disposition: A | Discharge status: Home / Self Care | Payer: 59 | Attending: Emergency Medicine | Admitting: Emergency Medicine

## 2021-11-04 ENCOUNTER — Encounter: Payer: 59 | Admitting: Physical Therapy

## 2021-11-04 ENCOUNTER — Emergency Department (HOSPITAL_BASED_OUTPATIENT_CLINIC_OR_DEPARTMENT_OTHER): Payer: 59

## 2021-11-04 DIAGNOSIS — Z794 Long term (current) use of insulin: Secondary | ICD-10-CM | POA: Insufficient documentation

## 2021-11-04 DIAGNOSIS — E1169 Type 2 diabetes mellitus with other specified complication: Secondary | ICD-10-CM | POA: Diagnosis not present

## 2021-11-04 DIAGNOSIS — I10 Essential (primary) hypertension: Secondary | ICD-10-CM | POA: Diagnosis not present

## 2021-11-04 DIAGNOSIS — Z79899 Other long term (current) drug therapy: Secondary | ICD-10-CM | POA: Diagnosis not present

## 2021-11-04 DIAGNOSIS — M25571 Pain in right ankle and joints of right foot: Secondary | ICD-10-CM

## 2021-11-04 DIAGNOSIS — Y9289 Other specified places as the place of occurrence of the external cause: Secondary | ICD-10-CM | POA: Insufficient documentation

## 2021-11-04 DIAGNOSIS — X501XXA Overexertion from prolonged static or awkward postures, initial encounter: Secondary | ICD-10-CM | POA: Diagnosis not present

## 2021-11-04 DIAGNOSIS — S99911A Unspecified injury of right ankle, initial encounter: Secondary | ICD-10-CM | POA: Diagnosis present

## 2021-11-04 DIAGNOSIS — T1490XA Injury, unspecified, initial encounter: Secondary | ICD-10-CM

## 2021-11-04 MED ORDER — IBUPROFEN 600 MG PO TABS: 600.0000 mg | ORAL_TABLET | Freq: Four times a day (QID) | ORAL | 0 refills | Status: DC | PRN

## 2021-11-04 NOTE — Discharge Instructions (Addendum)
You were seen today for evaluation of your right ankle pain after a fall.  Your x-ray was negative and did not show any signs of fracture or break.  You been prescribed ibuprofen 600 mg to take every 6 hours as needed for pain.  You were referred to a nonsurgical orthopedic provider that she can follow-up with if you are having worsening or prolonged pain.  Additional information on the RICE method as previously discussed will be included in the discharge paperwork.  If you have any concern, new or worsening symptoms, please return to the nearest emergency department for reevaluation

## 2021-11-04 NOTE — ED Notes (Signed)
Stepping down on stairs and twisted right ankle inwards.  States unable to place weight on it.  Swelling to outer aspect of ankle.  Noted hematoma to right side top of foot/ankle.  Cap refill good and predial pulse strong

## 2021-11-04 NOTE — ED Provider Notes (Signed)
[facility] EMERGENCY DEPT Provider Note   CSN: [id number] Arrival date & time: 11/04/21  1750     History Chief Complaint  Patient presents with   Ankle Pain    Peggy Schmidt is a 40 y.o. female the emergency department for evaluation of right ankle pain after landing on it wrong after taking a step.  Patient reports this is an inversion injury.  She denies any numbness or tingling.  Patient was ambulatory with pain.  She denies any weakness.  This is a mechanical fall.  Denies any dizziness or syncope.  Patient ports she has tried Epsom salts and ice with mild relief.  Medical history includes hypertension and diabetes.  Patient reports left ankle surgery in 2003.  Patient reports she does not take any daily medications despite her medical problems.  No known drug allergies.  Denies any tobacco, EtOH, or drug use.   Ankle Pain Associated symptoms: no back pain and no fever       Past Medical History:  Diagnosis Date   Abnormal uterine bleeding    DM (diabetes mellitus), type 2 ([place])    Hay fever    Hypertension    pt states her blood pressure has been up and down for the last year   Post concussion syndrome 2017    Patient Active Problem List   Diagnosis Date Noted   DM (diabetes mellitus), type 2 ([place])    Dyslipidemia associated with type 2 diabetes mellitus ([place]) 09/21/2018   Morbid obesity ([place]) 09/21/2018   Abnormal uterine bleeding (AUB) 06/11/2018   Microcytic anemia 02/15/2018   Hypertension associated with diabetes ([place]) 02/15/2018   Chronic pain of left knee 02/15/2018   History of head injury 02/15/2018    Past Surgical History:  Procedure Laterality Date   ANKLE FRACTURE SURGERY       OB History     Gravida  0   Para  0   Term  0   Preterm  0   AB  0   Living  0      SAB  0   IAB  0   Ectopic  0   Multiple  0   Live Births  0           Family History  Problem Relation Age of Onset   Hypertension Mother     Diabetes Mother    Depression Mother    Hyperlipidemia Mother    Hypertension Brother    Hypertension Maternal Grandmother    Diabetes Father    Hypertension Father     Social History   Tobacco Use   Smoking status: Never   Smokeless tobacco: Never  Vaping Use   Vaping Use: Never used  Substance Use Topics   Alcohol use: No   Drug use: No    Home Medications Prior to Admission medications   Medication Sig Start Date End Date Taking? Authorizing Provider  ibuprofen (ADVIL) 600 MG tablet Take 1 tablet (600 mg total) by mouth every 6 (six) hours as needed. 11/04/21  Yes Sherrell Puller, PA-C  fluticasone [facility]) 50 MCG/ACT nasal spray Place 2 sprays into both nostrils daily. 05/09/19   Vivi Barrack, MD  olmesartan-hydrochlorothiazide [facility] HCT) 20-12.5 MG tablet TAKE ONE TABLET BY MOUTH DAILY 11/04/20   Vivi Barrack, MD  Semaglutide, 1 MG/DOSE, 4 MG/3ML SOPN Inject 1 mg as directed once a week. 09/13/21   Vivi Barrack, MD  triamcinolone ointment (KENALOG) 0.5 % Apply 1  application topically 2 (two) times daily. 09/13/21   Vivi Barrack, MD    Allergies    Other  Review of Systems   Review of Systems  Constitutional:  Negative for chills and fever.  HENT:  Negative for ear pain and sore throat.   Eyes:  Negative for pain and visual disturbance.  Respiratory:  Negative for cough and shortness of breath.   Cardiovascular:  Negative for chest pain and palpitations.  Gastrointestinal:  Negative for abdominal pain and vomiting.  Genitourinary:  Negative for dysuria and hematuria.  Musculoskeletal:  Positive for arthralgias. Negative for back pain and joint swelling.  Skin:  Negative for color change and rash.  Neurological:  Negative for seizures and syncope.  All other systems reviewed and are negative.  Physical Exam Updated Vital Signs BP 123/88 (BP Location: Right Arm)   Pulse 91   Temp 98.6 F (37 C) (Oral)   Resp 18   Ht 5\' 5"  (1.651 m)   Wt 113.7 kg    SpO2 99%   BMI 41.71 kg/m   Physical Exam Vitals and nursing note reviewed.  Constitutional:      Appearance: Normal appearance.  Eyes:     General: No scleral icterus. Pulmonary:     Effort: Pulmonary effort is normal. No respiratory distress.  Musculoskeletal:        General: Tenderness present. No swelling or deformity.     Comments: DP and PT pulses intact bilaterally.  Sensation intact bilaterally.  Reflexes intact.  Patient has mild tenderness palpation of the lateral malleoli.  No swelling noted.  No overlying skin changes, rashes, ecchymosis, or erythema noted to the area.  Good cap refill.  No obvious deformities.  Compartments soft.  No tenderness to calf squeeze. Patient ambulatory with pain.   Skin:    General: Skin is dry.     Findings: No rash.  Neurological:     General: No focal deficit present.     Mental Status: She is alert. Mental status is at baseline.     Sensory: No sensory deficit.     Motor: No weakness.  Psychiatric:        Mood and Affect: Mood normal.    ED Results / Procedures / Treatments   Labs (all labs ordered are listed, but only abnormal results are displayed) Labs Reviewed - No data to display  EKG None  Radiology DG Ankle Right Port  Result Date: 11/04/2021 CLINICAL DATA:  Twisted ankle. EXAM: PORTABLE RIGHT ANKLE - 2 VIEW COMPARISON:  None. FINDINGS: The ankle mortise is maintained. No acute ankle fracture or osteochondral lesion. No definite joint effusion. The mid and hindfoot bony structures are intact. Small calcaneal spurs are noted. IMPRESSION: No acute bony findings. Electronically Signed   By: Marijo Sanes M.D.   On: 11/04/2021 18:34    Procedures Procedures   Medications Ordered in ED Medications - No data to display  ED Course  I have reviewed the triage vital signs and the nursing notes.  Pertinent labs & imaging results that were available during my care of the patient were reviewed by me and considered in my medical  decision making (see chart for details).  40 year old female presents to the emergency department after inversion ankle injury.  X-ray shows no acute ankle fracture or osteochondral lesion.  No acute bony findings.  Will recommend RICE method to patient in addition to ibuprofen 600 mg every 6 hours.  Will refer patient to nonsurgical orthopedic to rule  out tendon or ligament damage.  Patient has full range of motion, so low suspicion.  Imaging result discussed with patient.  Return precautions discussed. Patient agrees to plan.  Patient is stable to being discharged home in good condition.    MDM Rules/Calculators/A&P                          Final Clinical Impression(s) / ED Diagnoses Final diagnoses:  Injury  Acute right ankle pain    Rx / DC Orders ED Discharge Orders          Ordered    ibuprofen (ADVIL) 600 MG tablet  Every 6 hours PRN        11/04/21 1909             Sherrell Puller, PA-C 11/04/21 1912    Wyvonnia Dusky, MD 11/05/21 [phone]

## 2021-11-04 NOTE — ED Triage Notes (Signed)
Pt via pov from home with ankle pain after twisting it this morning. Pt denies and other injury, including hitting her head. Pt alert & oriented, nad noted.

## 2021-11-07 ENCOUNTER — Encounter: Payer: Self-pay | Admitting: Family Medicine

## 2021-11-08 ENCOUNTER — Encounter: Payer: 59 | Admitting: Physical Therapy

## 2021-11-08 ENCOUNTER — Telehealth: Payer: Self-pay

## 2021-11-08 NOTE — Telephone Encounter (Signed)
Pt called stating that she has papers for a sleep test to be done. She would like to know if Dr [name] can fill out the forms for the test. Pt would like a call back. Please Advise.

## 2021-11-08 NOTE — Telephone Encounter (Signed)
See note

## 2021-11-08 NOTE — Telephone Encounter (Signed)
Patient will bring form tomorrow

## 2021-11-09 ENCOUNTER — Other Ambulatory Visit: Payer: Self-pay | Admitting: *Deleted

## 2021-11-10 ENCOUNTER — Encounter: Payer: 59 | Admitting: Physical Therapy

## 2021-11-15 ENCOUNTER — Other Ambulatory Visit: Payer: Self-pay | Admitting: *Deleted

## 2021-11-15 NOTE — Telephone Encounter (Signed)
Referral placed.

## 2021-11-29 NOTE — Telephone Encounter (Signed)
Can you check on this referral

## 2021-12-15 ENCOUNTER — Other Ambulatory Visit: Payer: Self-pay

## 2021-12-15 ENCOUNTER — Ambulatory Visit (INDEPENDENT_AMBULATORY_CARE_PROVIDER_SITE_OTHER): Payer: 59

## 2021-12-15 DIAGNOSIS — Z789 Other specified health status: Secondary | ICD-10-CM | POA: Diagnosis not present

## 2021-12-15 MED ORDER — MEDROXYPROGESTERONE ACETATE 150 MG/ML IM SUSP
150.0000 mg | Freq: Once | INTRAMUSCULAR | Status: AC
  Administered 2021-12-15: 10:00:00 150 mg via INTRAMUSCULAR

## 2021-12-15 NOTE — Progress Notes (Signed)
After obtaining consent, and per orders of Dr. [name], injection of Depo Provera given by Loralyn [place].  Patient returns in 3 months

## 2021-12-20 ENCOUNTER — Other Ambulatory Visit: Payer: Self-pay | Admitting: Family Medicine

## 2021-12-23 ENCOUNTER — Telehealth: Payer: Self-pay | Admitting: *Deleted

## 2021-12-23 ENCOUNTER — Telehealth: Payer: Self-pay | Admitting: Family Medicine

## 2021-12-23 NOTE — Telephone Encounter (Signed)
The request has been approved. The authorization is effective for a maximum of 12 fills from 12/23/2021 to 12/22/2022, as long as the member is enrolled in their current health plan. ten notification letter will follow with additional details. Patient and Pharmacy notified

## 2021-12-23 NOTE — Telephone Encounter (Signed)
PA  (Key: [id number]) Rx #: [id number] Ozempic (1 MG/DOSE) 4MG /3ML pen-injectors Waiting for determination

## 2021-12-23 NOTE — Telephone Encounter (Signed)
Patient notified PA done and waiting for determination

## 2021-12-23 NOTE — Telephone Encounter (Signed)
Pt called and stated that ozempic isn't covered at her pharmacy. 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 Please advise.

## 2021-12-27 ENCOUNTER — Encounter: Payer: Self-pay | Admitting: Family Medicine

## 2021-12-27 ENCOUNTER — Telehealth: Payer: Self-pay | Admitting: Family Medicine

## 2021-12-27 DIAGNOSIS — G4730 Sleep apnea, unspecified: Secondary | ICD-10-CM

## 2021-12-27 NOTE — Telephone Encounter (Signed)
Please advise 

## 2021-12-27 NOTE — Telephone Encounter (Signed)
Spoke with pt who was open to repeating testing. Pt stated that she would like to proceed with Home study.

## 2021-12-27 NOTE — Telephone Encounter (Signed)
See telephone note.

## 2021-12-27 NOTE — Telephone Encounter (Signed)
It is difficult for me to contradict what another provider has said without having and testing or other objective evidence. I would recommend referring her for a sleep study if she is interested or we can try to order a home sleep study.  Algis Greenhouse. [name], MD 12/27/2021 3:48 PM

## 2021-12-27 NOTE — Telephone Encounter (Signed)
Pt called to talk to someone about getting a letter stating she does not need a Sleep Apnea test for her license renewal for work. Her license is currently on hold until she receives this or gets the test.

## 2022-01-11 LAB — HM DIABETES EYE EXAM

## 2022-01-17 ENCOUNTER — Other Ambulatory Visit (HOSPITAL_COMMUNITY): Payer: Self-pay

## 2022-01-19 ENCOUNTER — Telehealth: Payer: Self-pay

## 2022-01-19 NOTE — Telephone Encounter (Signed)
Patient called in and stated she was testing her blood sugar and she noticed her test strip were out of date was wondering if we could send in a new prescription and needles.

## 2022-01-19 NOTE — Telephone Encounter (Signed)
Patient will call back with name of meter to order lances and strip

## 2022-01-20 ENCOUNTER — Other Ambulatory Visit: Payer: Self-pay | Admitting: *Deleted

## 2022-01-20 ENCOUNTER — Encounter: Payer: Self-pay | Admitting: Family Medicine

## 2022-01-20 MED ORDER — GLUCOSE BLOOD VI STRP: ORAL_STRIP | 12 refills | Status: DC

## 2022-01-20 MED ORDER — ACCU-CHEK SOFTCLIX LANCETS MISC: 1.0000 | Freq: Three times a day (TID) | 11 refills | Status: DC

## 2022-01-20 NOTE — Telephone Encounter (Signed)
Ok with me. Please place any necessary orders. 

## 2022-01-24 ENCOUNTER — Other Ambulatory Visit: Payer: Self-pay | Admitting: *Deleted

## 2022-01-24 MED ORDER — GLUCOSE BLOOD VI STRP: ORAL_STRIP | 12 refills | Status: DC

## 2022-02-01 ENCOUNTER — Ambulatory Visit (HOSPITAL_BASED_OUTPATIENT_CLINIC_OR_DEPARTMENT_OTHER): Payer: 59 | Attending: Family Medicine | Admitting: Internal Medicine

## 2022-02-01 DIAGNOSIS — G4730 Sleep apnea, unspecified: Secondary | ICD-10-CM | POA: Diagnosis present

## 2022-02-01 DIAGNOSIS — G4733 Obstructive sleep apnea (adult) (pediatric): Secondary | ICD-10-CM | POA: Diagnosis not present

## 2022-02-04 ENCOUNTER — Other Ambulatory Visit: Payer: Self-pay

## 2022-02-04 ENCOUNTER — Ambulatory Visit: Payer: 59 | Admitting: Family Medicine

## 2022-02-04 ENCOUNTER — Encounter: Payer: Self-pay | Admitting: Family Medicine

## 2022-02-04 VITALS — BP 123/73 | HR 100 | Temp 98.1°F | Ht 65.0 in | Wt 241.4 lb

## 2022-02-04 DIAGNOSIS — E1159 Type 2 diabetes mellitus with other circulatory complications: Secondary | ICD-10-CM

## 2022-02-04 DIAGNOSIS — N939 Abnormal uterine and vaginal bleeding, unspecified: Secondary | ICD-10-CM

## 2022-02-04 DIAGNOSIS — E1169 Type 2 diabetes mellitus with other specified complication: Secondary | ICD-10-CM

## 2022-02-04 DIAGNOSIS — E785 Hyperlipidemia, unspecified: Secondary | ICD-10-CM | POA: Diagnosis not present

## 2022-02-04 DIAGNOSIS — I152 Hypertension secondary to endocrine disorders: Secondary | ICD-10-CM

## 2022-02-04 MED ORDER — CIPROFLOXACIN-DEXAMETHASONE 0.3-0.1 % OT SUSP: 4.0000 [drp] | Freq: Two times a day (BID) | OTIC | 0 refills | Status: DC

## 2022-02-04 NOTE — Patient Instructions (Addendum)
It was very nice to see you today!  Please start the eardrop.  Let us know if not improved by next week.  Take care, Dr [name]  PLEASE NOTE:  If you had any lab tests please let us know if you have not heard back within a few days. You may see your results on mychart before we have a chance to review them but we will give you a call once they are reviewed by [place]. If we ordered any referrals today, please let us know if you have not heard from their office within the next week.   Please try these tips to maintain a healthy lifestyle:  Eat at least 3 REAL meals and 1-2 snacks per day.  Aim for no more than 5 hours between eating.  If you eat breakfast, please do so within one hour of getting up.   Each meal should contain half fruits/vegetables, one quarter protein, and one quarter carbs (no bigger than a computer mouse)  Cut down on sweet beverages. This includes juice, soda, and sweet tea.   Drink at least 1 glass of water with each meal and aim for at least 8 glasses per day  Exercise at least 150 minutes every week.

## 2022-02-04 NOTE — Assessment & Plan Note (Signed)
A1c checked at work 7.3.  We will continue Ozempic 1 mg weekly.

## 2022-02-04 NOTE — Progress Notes (Signed)
° °  Peggy Schmidt is a 41 y.o. female who presents today for an office visit.  Assessment/Plan:  New/Acute Problems: Otalgia No red flags.  She does have some signs of ear canal irritation.  We will start Ciprodex drops.  She will let us know if not improving.  Chronic Problems Addressed Today: Dyslipidemia associated with type 2 diabetes mellitus ([place]) Recently had labs done with work showed elevated cholesterol.  She is working on diet and exercise.  We can recheck again in 6 months or so.  Abnormal uterine bleeding (AUB) She has history of fibroids.  On Depo-Provera.  She would like to try off her Depo-Provera and see how things go.  Think this is reasonable.  She will follow-up with gynecology soon.  Hypertension associated with diabetes ([place]) At goal on olmesartan-HCTZ 20-12.5 once daily.   DM (diabetes mellitus), type 2 ([place]) A1c checked at work 7.3.  We will continue Ozempic 1 mg weekly.    Subjective:  HPI:  Patient here with left ear pain.  Started a few days ago. A lot itching. Some drainage. No treatments tried. She is concerned about an ear infection.        Objective:  Physical Exam: BP 123/73 (BP Location: Left Arm)    Pulse 100    Temp 98.1 F (36.7 C) (Temporal)    Ht 5\' 5"  (1.651 m)    Wt 241 lb 6.4 oz (109.5 kg)    SpO2 98%    BMI 40.17 kg/m   Gen: No acute distress, resting comfortably HEENT: Left EAC with irritation.  TM clear.  Right normal. CV: Regular rate and rhythm with no murmurs appreciated Pulm: Normal work of breathing, clear to auscultation bilaterally with no crackles, wheezes, or rhonchi Neuro: Grossly normal, moves all extremities Psych: Normal affect and thought content      Nera Haworth M. [name], MD 02/04/2022 4:07 PM

## 2022-02-04 NOTE — Assessment & Plan Note (Signed)
She has history of fibroids.  On Depo-Provera.  She would like to try off her Depo-Provera and see how things go.  Think this is reasonable.  She will follow-up with gynecology soon.

## 2022-02-04 NOTE — Assessment & Plan Note (Signed)
Recently had labs done with work showed elevated cholesterol.  She is working on diet and exercise.  We can recheck again in 6 months or so.

## 2022-02-04 NOTE — Assessment & Plan Note (Signed)
At goal on olmesartan-HCTZ 20-12.5 once daily.

## 2022-02-06 DIAGNOSIS — G4730 Sleep apnea, unspecified: Secondary | ICD-10-CM

## 2022-02-06 NOTE — Procedures (Signed)
° ° °  Patient Name: Peggy Schmidt, Peggy Schmidt Date: 02/01/2022 Gender: Female D.O.B: 1981-06-28 Age (years): 40 Referring Provider: Vivi Barrack Height (inches): 62 Interpreting Physician: Baird Lyons MD, ABSM Weight (lbs): 246 RPSGT: Jacolyn Reedy BMI: 41 MRN: [id number] Neck Size: 14.50  CLINICAL INFORMATION Sleep Study Type: HST Indication for sleep study: OSA Epworth Sleepiness Score: 1  SLEEP STUDY TECHNIQUE A multi-channel overnight portable sleep study was performed. The channels recorded were: nasal airflow, thoracic respiratory movement, and oxygen saturation with a pulse oximetry. Snoring was also monitored.  MEDICATIONS Patient self administered medications include: none reported.  SLEEP ARCHITECTURE Patient was studied for 368.1 minutes. The sleep efficiency was 100.0 % and the patient was supine for 0.1%. The arousal index was 0.0 per hour.  RESPIRATORY PARAMETERS The overall AHI was 6.7 per hour, with a central apnea index of 0 per hour. The oxygen nadir was 85% during sleep.  CARDIAC DATA Mean heart rate during sleep was 85.0 bpm.  IMPRESSIONS - Mild obstructive sleep apnea occurred during this study (AHI = 6.7/h). - Moderate oxygen desaturation was noted during this study (Min O2 = 85%). Mean O2 saturation 96%. - Patient snored.  DIAGNOSIS - Obstructive Sleep Apnea (G47.33)  RECOMMENDATIONS - Treatment for minimal OSA is directed by symptoms and  co-morbidity. Conservative measures may include observation, weight loss and sleep position off back. Other options, including Sleep Medicine consultation, CPAP, a fitted oral appliance or ENT evaluation, would be based on clinical judgment. - Be careful with alcohol, sedatives and other CNS depressants that may worsen sleep apnea and disrupt normal sleep architecture. - Sleep hygiene should be reviewed to assess factors that may improve sleep quality. - Weight management and regular exercise should be  initiated or continued.  [Electronically signed] 02/06/2022 11:50 AM  Baird Lyons MD, ABSM Diplomate, American Board of Sleep Medicine   NPI: [id number]                         [place]  ELECTRONICALLY SIGNED ON:  02/06/2022, 11:46 AM [place] PH: [phone]   FX: [phone] [place]

## 2022-02-08 ENCOUNTER — Ambulatory Visit: Payer: 59 | Admitting: Family Medicine

## 2022-02-11 ENCOUNTER — Other Ambulatory Visit (HOSPITAL_COMMUNITY): Payer: Self-pay

## 2022-02-11 MED FILL — Olmesartan Medoxomil-Hydrochlorothiazide Tab 20-12.5 MG: ORAL | 60 days supply | Qty: 60 | Fill #0 | Status: AC

## 2022-02-17 ENCOUNTER — Telehealth: Payer: Self-pay | Admitting: Family Medicine

## 2022-02-17 NOTE — Telephone Encounter (Signed)
Has she discussed with sleep medicine? Looks like she has mild sleep apnea. Does not necessarily need to be treated but if she is interested then we can refer her to sleep medicine for a consultation. ? ?Peggy Schmidt. [name], MD ?02/17/2022 3:29 PM  ? ?

## 2022-02-17 NOTE — Telephone Encounter (Signed)
Called pt and advised I would contact her once the sleep study results have been reviewed ?

## 2022-02-17 NOTE — Telephone Encounter (Signed)
Pt verified DOB, not interested in referral to sleep medicine at this time. Pt verbalized understanding ?

## 2022-02-17 NOTE — Telephone Encounter (Signed)
Pt called in wanting to get results from her sleep study done on 02/01/22.  ?

## 2022-03-04 ENCOUNTER — Ambulatory Visit: Payer: 59

## 2022-03-18 ENCOUNTER — Ambulatory Visit: Payer: Self-pay | Admitting: Family Medicine

## 2022-04-01 ENCOUNTER — Encounter: Payer: Self-pay | Admitting: Family Medicine

## 2022-04-01 ENCOUNTER — Ambulatory Visit (INDEPENDENT_AMBULATORY_CARE_PROVIDER_SITE_OTHER): Payer: 59 | Admitting: Family Medicine

## 2022-04-01 VITALS — BP 123/88 | HR 95 | Temp 98.0°F | Ht 65.0 in | Wt 238.6 lb

## 2022-04-01 DIAGNOSIS — R21 Rash and other nonspecific skin eruption: Secondary | ICD-10-CM | POA: Diagnosis not present

## 2022-04-01 DIAGNOSIS — E1169 Type 2 diabetes mellitus with other specified complication: Secondary | ICD-10-CM | POA: Diagnosis not present

## 2022-04-01 LAB — POCT GLYCOSYLATED HEMOGLOBIN (HGB A1C): Hemoglobin A1C: 6.3 % — AB (ref 4.0–5.6)

## 2022-04-01 MED ORDER — TRIAMCINOLONE ACETONIDE 0.1 % EX CREA: 1.0000 "application " | TOPICAL_CREAM | Freq: Two times a day (BID) | CUTANEOUS | 1 refills | Status: DC

## 2022-04-01 NOTE — Progress Notes (Signed)
? ?Subjective:  ? ? ? Patient ID: Peggy Schmidt, female    DOB: [birth year], 41 y.o.   MRN: [id number] ? ?Chief Complaint  ?Patient presents with  ? Rash  ?  Pt c/o itchy bumps that appear in clusters on arms and back of her neck at times, pt says it only happens in the sun and predominately in the summer. Pt states spreads when scratching area;   ? Diabetes  ?  Pt was due for A1C and POC came back 6.3   ? ? ?HPI ?Rash intermitt for 5-6 yrs.  Takes benadryl.  When in summer, occurs arms mostly.  Occ back of neck.  Not under breasts. Clusters of itchy bumps.  Very itchy.  Outdoors for job. Will resolve if not outdoors in sun.   ? ?There are no preventive care reminders to display for this patient. ? ?Past Medical History:  ?Diagnosis Date  ? Abnormal uterine bleeding   ? DM (diabetes mellitus), type 2 ([place])   ? Hay fever   ? Hypertension   ? pt states her blood pressure has been up and down for the last year  ? Post concussion syndrome 2017  ? ? ?Past Surgical History:  ?Procedure Laterality Date  ? ANKLE FRACTURE SURGERY    ? ? ?Outpatient Medications Prior to Visit  ?Medication Sig Dispense Refill  ? Accu-Chek Softclix Lancets lancets 1 each by Other route 3 (three) times daily. E11.9 102 each 11  ? fluticasone (FLONASE) 50 MCG/ACT nasal spray Place 2 sprays into both nostrils daily. 16 g 6  ? glucose blood test strip Use TID as needed for glucose check  E11.9 100 each 12  ? olmesartan-hydrochlorothiazide (BENICAR HCT) 20-12.5 MG tablet Take 1 tablet by mouth daily. 90 tablet 0  ? Semaglutide, 1 MG/DOSE, 4 MG/3ML SOPN Inject 1 mg as directed once a week. 9 mL 3  ? ciprofloxacin-dexamethasone (CIPRODEX) OTIC suspension Place 4 drops into the left ear 2 (two) times daily. (Patient not taking: Reported on 04/01/2022) 7.5 mL 0  ? triamcinolone ointment (KENALOG) 0.5 % Apply 1 application topically 2 (two) times daily. 30 g 0  ? ?Facility-Administered Medications Prior to Visit  ?Medication Dose Route Frequency Provider  Last Rate Last Admin  ? medroxyPROGESTERone (DEPO-PROVERA) injection 150 mg  150 mg Intramuscular Q8 Weeks Anyanwu, Ugonna A, MD   150 mg at 02/26/20 1430  ? ? ?Allergies  ?Allergen Reactions  ? Other   ?  Black walnuts ?  ? ?ROS neg/noncontributory except as noted HPI/below ? ? ?   ?Objective:  ?  ? ?BP 123/88 (BP Location: Right Arm)   Pulse 95   Temp 98 ?F (36.7 ?C) (Temporal)   Ht '5\' 5"'$  (1.651 m)   Wt 238 lb 9.6 oz (108.2 kg)   SpO2 99%   BMI 39.71 kg/m?  ?Wt Readings from Last 3 Encounters:  ?04/01/22 238 lb 9.6 oz (108.2 kg)  ?02/04/22 241 lb 6.4 oz (109.5 kg)  ?02/01/22 246 lb (111.6 kg)  ? ? ?Physical Exam  ? ?Gen: WDWN NAD ?HEENT: NCAT, conjunctiva not injected, sclera nonicteric ?ABDOMEN:  BS+, soft, NTND, No HSM, no masses ?EXT:  no edema ?MSK: no gross abnormalities.  ?NEURO: A&O x3.  CN II-XII intact.  ?PSYCH: normal mood. Good eye contact ?Skin-some annular areas on forearms of irrit, bumpy lesions.   ? ?   ?Assessment & Plan:  ? ?Problem List Items Addressed This Visit   ? ?  ? Endocrine  ?  DM (diabetes mellitus), type 2 ([place]) - Primary  ? Relevant Orders  ? POCT HgB A1C (Completed)  ? ?Other Visit Diagnoses   ? ? Rash      ? ?  ? Rash-eczema type-suspect from sun sensitivity.  Moisturizers, get sun protection(SPF) shirts.  Wear sun screen.  Triamcinolone if really flared. ?DM-taking semiglutide.  Working on [employer].  Very happy that A1C 6.3!!! Congrats.  Continue.   ? ?No orders of the defined types were placed in this encounter. ? ? ?Wellington Hampshire, MD ? ?

## 2022-04-01 NOTE — Patient Instructions (Signed)
It was very nice to see you today! ? ?Get sunscreen shirts.  Use moisturizers ? ? ?PLEASE NOTE: ? ?If you had any lab tests please let us know if you have not heard back within a few days. You may see your results on MyChart before we have a chance to review them but we will give you a call once they are reviewed by [place]. If we ordered any referrals today, please let us know if you have not heard from their office within the next week.  ? ?Please try these tips to maintain a healthy lifestyle: ? ?Eat most of your calories during the day when you are active. Eliminate processed foods including packaged sweets (pies, cakes, cookies), reduce intake of potatoes, white bread, white pasta, and white rice. Look for whole grain options, oat flour or almond flour. ? ?Each meal should contain half fruits/vegetables, one quarter protein, and one quarter carbs (no bigger than a computer mouse). ? ?Cut down on sweet beverages. This includes juice, soda, and sweet tea. Also watch fruit intake, though this is a healthier sweet option, it still contains natural sugar! Limit to 3 servings daily. ? ?Drink at least 1 glass of water with each meal and aim for at least 8 glasses per day ? ?Exercise at least 150 minutes every week.   ?

## 2022-04-06 ENCOUNTER — Telehealth: Payer: Self-pay | Admitting: *Deleted

## 2022-04-06 ENCOUNTER — Other Ambulatory Visit: Payer: Self-pay | Admitting: *Deleted

## 2022-04-06 NOTE — Telephone Encounter (Signed)
(  Key: [id number]) ?Rx #: [id number] ?Ozempic (0.25 or 0.5 MG/DOSE) '2MG'$ /1.5ML pen-injectors ?Waiting for determination  ?

## 2022-04-06 NOTE — Telephone Encounter (Signed)
Request Reference Number: [id number]. OZEMPIC INJ 2/1.5ML is approved through 04/07/2023. ?NOTICE OF APPROVAL printed and placed to be scan in patient chart  ? ?

## 2022-04-08 ENCOUNTER — Other Ambulatory Visit: Payer: Self-pay

## 2022-04-08 MED ORDER — OLMESARTAN MEDOXOMIL-HCTZ 20-12.5 MG PO TABS: 1.0000 | ORAL_TABLET | Freq: Every day | ORAL | 0 refills | Status: DC

## 2022-04-08 MED ORDER — SEMAGLUTIDE (1 MG/DOSE) 4 MG/3ML ~~LOC~~ SOPN: 1.0000 mg | PEN_INJECTOR | SUBCUTANEOUS | 3 refills | Status: DC

## 2022-04-08 NOTE — Telephone Encounter (Signed)
Rx sent to pharmacy   

## 2022-04-08 NOTE — Telephone Encounter (Addendum)
Pt states she has not taken the medicine for three weeks. ? ?Pt requests this Ozempic refill Rx sent to the following pharmacy: ? ?[facility] [id number] - [address], [address]  ?[place], [place] [address]  ?Phone:  [phone]  Fax:  [phone]  ?DEA #:  -- ?DAW Reason: -- ? ?Not the [place] ?

## 2022-04-15 ENCOUNTER — Ambulatory Visit (INDEPENDENT_AMBULATORY_CARE_PROVIDER_SITE_OTHER): Payer: 59 | Admitting: Family Medicine

## 2022-04-15 ENCOUNTER — Ambulatory Visit (INDEPENDENT_AMBULATORY_CARE_PROVIDER_SITE_OTHER)
Admission: RE | Admit: 2022-04-15 | Discharge: 2022-04-15 | Disposition: A | Discharge status: Home / Self Care | Payer: 59 | Source: Ambulatory Visit | Attending: Family Medicine | Admitting: Family Medicine

## 2022-04-15 ENCOUNTER — Encounter: Payer: Self-pay | Admitting: Family Medicine

## 2022-04-15 VITALS — BP 130/78 | HR 80 | Temp 97.5°F | Ht 65.0 in | Wt 244.2 lb

## 2022-04-15 DIAGNOSIS — I152 Hypertension secondary to endocrine disorders: Secondary | ICD-10-CM | POA: Diagnosis not present

## 2022-04-15 DIAGNOSIS — E1169 Type 2 diabetes mellitus with other specified complication: Secondary | ICD-10-CM | POA: Diagnosis not present

## 2022-04-15 DIAGNOSIS — E1159 Type 2 diabetes mellitus with other circulatory complications: Secondary | ICD-10-CM | POA: Diagnosis not present

## 2022-04-15 DIAGNOSIS — R0789 Other chest pain: Secondary | ICD-10-CM | POA: Diagnosis not present

## 2022-04-15 MED ORDER — PANTOPRAZOLE SODIUM 40 MG PO TBEC: 40.0000 mg | DELAYED_RELEASE_TABLET | Freq: Every day | ORAL | 3 refills | Status: DC

## 2022-04-15 NOTE — Progress Notes (Signed)
? ?  Peggy Schmidt is a 41 y.o. female who presents today for an office visit. ? ?Assessment/Plan:  ?New/Acute Problems: ?Atypical chest pain ?Likely GI related given that she has exacerbation when eating certain foods such as chicken wings.  Doubt any significant underlying cardiac or pulmonary etiology given that she has had relatively stable symptoms for the last 2 years and the fact that her symptoms are nonexertional.  We will check a chest x-ray as this has not been done for about 6 years or so.  She has been seen in the ED for this in the past and has had EKGs which were normal.  Very low suspicion for PE given her length of symptoms.  She does not have any other classic biliary colic symptoms however this is on the differential given exacerbation when eating fatty foods.  We will try Protonix 40 mg daily for 1 to 2 weeks.  If symptoms persist would consider referral to GI or checking right upper quadrant ultrasound.  We discussed reasons to return to care or seek emergent care. ? ?Chronic Problems Addressed Today: ?DM (diabetes mellitus), type 2 ([place]) ?A1c improved to 6.4.  Continue Ozempic 1 mg weekly. ? ?Hypertension associated with diabetes ([place]) ?At goal today on olmesartan-HCTZ 20-12.5 once daily. ? ? ?  ?Subjective:  ?HPI: ? ?Patient her with intermittent chest pain. Started about 2 years ago. Comes and goes. She does not have any current symptoms. She initially thought it was a pulled muscle.  She has been to the ED for this a few months ago and reportedly had a "normal" work up per patient. No nausea or vomiting. Symptoms occur almost daily No treatments tried. Worse after eating chicken wings. Sometimes burps a lot. Does not feel like food is getting stuck.  No regurgitation.  She is able to exercise normally without any exacerbation of symptoms. ? ?   ?  ?Objective:  ?Physical Exam: ?BP 130/78   Pulse 80   Temp (!) 97.5 ?F (36.4 ?C)   Ht '5\' 5"'$  (1.651 m)   Wt 244 lb 3.2 oz (110.8 kg)   SpO2 98%    BMI 40.64 kg/m?   ?Wt Readings from Last 3 Encounters:  ?04/15/22 244 lb 3.2 oz (110.8 kg)  ?04/01/22 238 lb 9.6 oz (108.2 kg)  ?02/04/22 241 lb 6.4 oz (109.5 kg)  ?Gen: No acute distress, resting comfortably ?CV: Regular rate and rhythm with no murmurs appreciated ?Pulm: Normal work of breathing, clear to auscultation bilaterally with no crackles, wheezes, or rhonchi ?GI: Soft, nontender, nondistended ?Neuro: Grossly normal, moves all extremities ?Psych: Normal affect and thought content ? ?   ? ?Algis Greenhouse. [name], MD ?04/15/2022 10:48 AM  ?

## 2022-04-15 NOTE — Patient Instructions (Signed)
It was very nice to see you today! ? ?You are probably having issues with acid reflux and inflammation. ? ?Please start the acid blocker for a couple of weeks.  We will get a chest x-ray today to make sure things okay with your lungs. ? ?Please send a message in a couple of weeks to let me know how you are doing. ? ?Take care, ?Dr [name] ? ?PLEASE NOTE: ? ?If you had any lab tests please let us know if you have not heard back within a few days. You may see your results on mychart before we have a chance to review them but we will give you a call once they are reviewed by [place]. If we ordered any referrals today, please let us know if you have not heard from their office within the next week.  ? ?Please try these tips to maintain a healthy lifestyle: ? ?Eat at least 3 REAL meals and 1-2 snacks per day.  Aim for no more than 5 hours between eating.  If you eat breakfast, please do so within one hour of getting up.  ? ?Each meal should contain half fruits/vegetables, one quarter protein, and one quarter carbs (no bigger than a computer mouse) ? ?Cut down on sweet beverages. This includes juice, soda, and sweet tea.  ? ?Drink at least 1 glass of water with each meal and aim for at least 8 glasses per day ? ?Exercise at least 150 minutes every week.   ?

## 2022-04-15 NOTE — Assessment & Plan Note (Signed)
A1c improved to 6.4.  Continue Ozempic 1 mg weekly. ?

## 2022-04-15 NOTE — Assessment & Plan Note (Signed)
At goal today on olmesartan-HCTZ 20-12.5 once daily. ?

## 2022-04-18 NOTE — Progress Notes (Signed)
Please inform patient of the following: ? ?Her chest xray is normal. Do not need to make any changes to treatment plan. I would like for her to let us know if symptoms are not improving. ? ?Algis Greenhouse. [name], MD ?04/18/2022 3:49 PM  ?

## 2022-05-01 ENCOUNTER — Encounter: Payer: Self-pay | Admitting: Family Medicine

## 2022-05-02 NOTE — Telephone Encounter (Signed)
Thank you for letting us know and please keep [place] posted if you need anything further. Have a great day! ? ?Sincerely, ? ?Fabianna Keats C, CMA  ?

## 2022-05-02 NOTE — Telephone Encounter (Signed)
See note

## 2022-05-02 NOTE — Telephone Encounter (Signed)
Looks like the needle may have not went far enough into the skin. This shouldn't be an issue and should go away within a day or so. She should try making sure the needle is FULLY inserted to keep this from happening again. ? ?Algis Greenhouse. [name], MD ?05/02/2022 10:53 AM  ? ?

## 2022-05-07 ENCOUNTER — Encounter: Payer: Self-pay | Admitting: Obstetrics & Gynecology

## 2022-06-06 ENCOUNTER — Ambulatory Visit: Payer: 59 | Admitting: Physician Assistant

## 2022-06-06 ENCOUNTER — Emergency Department (HOSPITAL_BASED_OUTPATIENT_CLINIC_OR_DEPARTMENT_OTHER): Payer: 59

## 2022-06-06 ENCOUNTER — Encounter (HOSPITAL_BASED_OUTPATIENT_CLINIC_OR_DEPARTMENT_OTHER): Payer: Self-pay | Admitting: Emergency Medicine

## 2022-06-06 ENCOUNTER — Emergency Department (HOSPITAL_BASED_OUTPATIENT_CLINIC_OR_DEPARTMENT_OTHER)
Admission: EM | Admit: 2022-06-06 | Discharge: 2022-06-06 | Disposition: A | Discharge status: Home / Self Care | Payer: 59 | Attending: Emergency Medicine | Admitting: Emergency Medicine

## 2022-06-06 ENCOUNTER — Other Ambulatory Visit: Payer: Self-pay

## 2022-06-06 ENCOUNTER — Telehealth: Payer: Self-pay | Admitting: Family Medicine

## 2022-06-06 DIAGNOSIS — N939 Abnormal uterine and vaginal bleeding, unspecified: Secondary | ICD-10-CM | POA: Diagnosis not present

## 2022-06-06 DIAGNOSIS — R1030 Lower abdominal pain, unspecified: Secondary | ICD-10-CM | POA: Diagnosis present

## 2022-06-06 DIAGNOSIS — R102 Pelvic and perineal pain: Secondary | ICD-10-CM

## 2022-06-06 LAB — URINALYSIS, ROUTINE W REFLEX MICROSCOPIC
Bilirubin Urine: NEGATIVE
Glucose, UA: NEGATIVE mg/dL
Ketones, ur: NEGATIVE mg/dL
Nitrite: NEGATIVE
Protein, ur: 30 mg/dL — AB
Specific Gravity, Urine: 1.028 (ref 1.005–1.030)
pH: 6 (ref 5.0–8.0)

## 2022-06-06 LAB — CBC
HCT: 45 % (ref 36.0–46.0)
Hemoglobin: 14.7 g/dL (ref 12.0–15.0)
MCH: 27.7 pg (ref 26.0–34.0)
MCHC: 32.7 g/dL (ref 30.0–36.0)
MCV: 84.7 fL (ref 80.0–100.0)
Platelets: 359 10*3/uL (ref 150–400)
RBC: 5.31 MIL/uL — ABNORMAL HIGH (ref 3.87–5.11)
RDW: 13.1 % (ref 11.5–15.5)
WBC: 7.2 10*3/uL (ref 4.0–10.5)
nRBC: 0 % (ref 0.0–0.2)

## 2022-06-06 LAB — COMPREHENSIVE METABOLIC PANEL
ALT: 9 U/L (ref 0–44)
AST: 11 U/L — ABNORMAL LOW (ref 15–41)
Albumin: 4.5 g/dL (ref 3.5–5.0)
Alkaline Phosphatase: 71 U/L (ref 38–126)
Anion gap: 11 (ref 5–15)
BUN: 5 mg/dL — ABNORMAL LOW (ref 6–20)
CO2: 24 mmol/L (ref 22–32)
Calcium: 9.9 mg/dL (ref 8.9–10.3)
Chloride: 105 mmol/L (ref 98–111)
Creatinine, Ser: 0.81 mg/dL (ref 0.44–1.00)
GFR, Estimated: >60 mL/min (ref >60)
Glucose, Bld: 149 mg/dL — ABNORMAL HIGH (ref 70–99)
Potassium: 3.1 mmol/L — ABNORMAL LOW (ref 3.5–5.1)
Sodium: 140 mmol/L (ref 135–145)
Total Bilirubin: 0.4 mg/dL (ref 0.3–1.2)
Total Protein: 7.8 g/dL (ref 6.5–8.1)

## 2022-06-06 LAB — PREGNANCY, URINE: Preg Test, Ur: NEGATIVE

## 2022-06-06 LAB — LIPASE, BLOOD: Lipase: 21 U/L (ref 11–51)

## 2022-06-06 NOTE — Telephone Encounter (Signed)
Noted  

## 2022-06-06 NOTE — Telephone Encounter (Signed)
Patient called requesting an OV due to lower abdominal pain. Upon further discussion, patient described pain as a sharp sensation that is accompanied by sporadic spotting. States that she does have an appointment with gynecology on 06/17/22, but wanted to know if there was anything she could do now. Although I was able to schedule pt with Inda Coke on 06/06/22 at 3:40pm, I have sent patient to triage.

## 2022-06-06 NOTE — Telephone Encounter (Signed)
Patient sent to ED -    Patient Name: Peggy Schmidt Gender: Female DOB: 1981/09/04 Age: 41 Y 1 M 1 D Return Phone Number: [phone] (Primary) Address: City/ State/ Zip: [place]  [address] Client [facility] [place] Client Site [facility] [place] [facility] Contact Type Call Who Is Calling Patient / Member / Family / Caregiver Call Type Triage / Clinical Relationship To Patient Self Return Phone Number [phone] (Primary) Chief Complaint SEVERE ABDOMINAL PAIN - Severe pain in abdomen Reason for Call Symptomatic / Request for [name], sharp, abdominal pain with vaginal bleeding. Translation No Nurse Assessment Nurse: Laurann Montana, RN, [name] Date/Time [name] Time): 06/06/2022 9:03:49 AM Confirm and document reason for call. If symptomatic, describe symptoms. ---Patient has abd pain and abnormal vaginal bleeding. Patient has been taking the Depo injection since 2019 and decided to stop the shot in March of 2023. Caller states no other sx. Does the patient have any new or worsening symptoms? ---Yes Will a triage be completed? ---Yes Related visit to physician within the last 2 weeks? ---No Does the PT have any chronic conditions? (i.e. diabetes, asthma, this includes High risk factors for pregnancy, etc.) ---Yes List chronic conditions. ---DM Is the patient pregnant or possibly pregnant? (Ask all females between the ages of 24-55) ---No Is this a behavioral health or substance abuse call? ---No Guidelines Guideline Title Affirmed Question Affirmed Notes Nurse Date/Time [name] Time) Vaginal Bleeding - Abnormal SEVERE abdominal pain Laurann Montana, RN, [name] 06/06/2022 9:06:17 AM PLEASE NOTE: All timestamps contained within this report are represented as [place] Standard Time. CONFIDENTIALTY NOTICE: This fax transmission is intended only for  the addressee. It contains information that is legally privileged, confidential or otherwise protected from use or disclosure. If you are not the intended recipient, you are strictly prohibited from reviewing, disclosing, copying using or disseminating any of this information or taking any action in reliance on or regarding this information. If you have received this fax in error, please notify us immediately by telephone so that we can arrange for its return to [place]. Phone: [phone], Toll-Free: [phone], Fax: [phone] Page: 2 of 2 Call Id: [id number] [place]. Time [name] Time) Disposition Final User 06/06/2022 9:02:37 AM Send to Urgent Queue Windy Canny 06/06/2022 9:07:40 AM Go to ED Now Yes Laurann Montana, RN, [place] Disagree/Comply Comply Caller Understands Yes PreDisposition InappropriateToAsk Care Advice Given Per Guideline GO TO ED NOW: * You need to be seen in the Emergency Department. * Leave now. Drive carefully. ANOTHER ADULT SHOULD DRIVE: * It is better and safer if another adult drives instead of you. CALL EMS IF: * You feel too weak to stand * You pass out or feel like passing out * You feel much worse. CARE ADVICE given per Vaginal Bleeding, Abnormal (Adult) guideline.

## 2022-06-06 NOTE — ED Notes (Signed)
Patient discharged at this time.

## 2022-06-06 NOTE — ED Triage Notes (Signed)
Generalized abd pain x 1 week. Denies N/V/D. Also reports vaginal bleeding.

## 2022-06-06 NOTE — Discharge Instructions (Signed)
Please keep your appointment with your OB/GYN in the next 2 weeks for abnormal ultrasound results.

## 2022-06-06 NOTE — ED Provider Notes (Signed)
[facility] EMERGENC[facility] DEPT Provider Note   CSN: [id number] Arrival date & time: 06/06/22  6789     History  Chief Complaint  Patient presents with   Abdominal Pain    Peggy Schmidt is a 41 y.o. female.  Patient is a 41 year old female presenting for suprapubic abdominal pain.  Patient mitts to suprapubic abdominal pain since stopping Depo-Provera injections in March of this year. Admits to irregular menstrual bleeding. Denies any pelvic discharge, pelvic lesions, pressure, or pelvic pain.  Denies any increased urgency, frequency, or dysuria.  The history is provided by the patient. No language interpreter was used.  Abdominal Pain Associated symptoms: vaginal bleeding   Associated symptoms: no chest pain, no chills, no cough, no dysuria, no fever, no hematuria, no shortness of breath, no sore throat and no vomiting        Home Medications Prior to Admission medications   Medication Sig Start Date End Date Taking? Authorizing Provider  Accu-Chek Softclix Lancets lancets 1 each by Other route 3 (three) times daily. E11.9 01/20/22   Vivi Barrack, MD  ciprofloxacin-dexamethasone [facility]) OTIC suspension Place 4 drops into the left ear 2 (two) times daily. Patient not taking: Reported on 04/01/2022 02/04/22   Vivi Barrack, MD  fluticasone [facility]) 50 MCG/ACT nasal spray Place 2 sprays into both nostrils daily. 05/09/19   Vivi Barrack, MD  glucose blood test strip Use TID as needed for glucose check  E11.9 01/24/22   Vivi Barrack, MD  olmesartan-hydrochlorothiazide (BENICAR HCT) 20-12.5 MG tablet Take 1 tablet by mouth daily. 04/08/22   Vivi Barrack, MD  pantoprazole (PROTONIX) 40 MG tablet Take 1 tablet (40 mg total) by mouth daily. 04/15/22   Vivi Barrack, MD  Semaglutide, 1 MG/DOSE, 4 MG/3ML SOPN Inject 1 mg as directed once a week. 04/08/22   Vivi Barrack, MD  triamcinolone cream (KENALOG) 0.1 % Apply 1 application. topically 2 (two) times daily.  04/01/22   Tawnya Crook, MD      Allergies    Other    Review of Systems   Review of Systems  Constitutional:  Negative for chills and fever.  HENT:  Negative for ear pain and sore throat.   Eyes:  Negative for pain and visual disturbance.  Respiratory:  Negative for cough and shortness of breath.   Cardiovascular:  Negative for chest pain and palpitations.  Gastrointestinal:  Positive for abdominal pain. Negative for vomiting.  Genitourinary:  Positive for vaginal bleeding. Negative for dysuria and hematuria.  Musculoskeletal:  Negative for arthralgias and back pain.  Skin:  Negative for color change and rash.  Neurological:  Negative for seizures and syncope.  All other systems reviewed and are negative.   Physical Exam Updated Vital Signs BP 119/75   Pulse 81   Temp 98.6 F (37 C)   Resp 17   Ht '5\' 5"'$  (1.651 m)   Wt 111.1 kg   SpO2 100%   BMI 40.77 kg/m  Physical Exam Vitals and nursing note reviewed.  Constitutional:      General: She is not in acute distress.    Appearance: She is well-developed.  HENT:     Head: Normocephalic and atraumatic.  Eyes:     Conjunctiva/sclera: Conjunctivae normal.  Cardiovascular:     Rate and Rhythm: Normal rate and regular rhythm.     Heart sounds: No murmur heard. Pulmonary:     Effort: Pulmonary effort is normal. No respiratory distress.  Breath sounds: Normal breath sounds.  Abdominal:     Palpations: Abdomen is soft.     Tenderness: There is abdominal tenderness in the suprapubic area. There is no guarding or rebound.  Musculoskeletal:        General: No swelling.     Cervical back: Neck supple.  Skin:    General: Skin is warm and dry.     Capillary Refill: Capillary refill takes less than 2 seconds.  Neurological:     Mental Status: She is alert.  Psychiatric:        Mood and Affect: Mood normal.     ED Results / Procedures / Treatments   Labs (all labs ordered are listed, but only abnormal results are  displayed) Labs Reviewed  COMPREHENSIVE METABOLIC PANEL - Abnormal; Notable for the following components:      Result Value   Potassium 3.1 (*)    Glucose, Bld 149 (*)    BUN 5 (*)    AST 11 (*)    All other components within normal limits  CBC - Abnormal; Notable for the following components:   RBC 5.31 (*)    All other components within normal limits  URINALYSIS, ROUTINE W REFLEX MICROSCOPIC - Abnormal; Notable for the following components:   APPearance HAZY (*)    Hgb urine dipstick MODERATE (*)    Protein, ur 30 (*)    Leukocytes,Ua TRACE (*)    Bacteria, UA FEW (*)    All other components within normal limits  LIPASE, BLOOD  PREGNANCY, URINE    EKG None  Radiology US PELVIC COMPLETE WITH TRANSVAGINAL  Result Date: 06/06/2022 CLINICAL DATA:  Vaginal spotting since being placed on a Depo shot. EXAM: TRANSABDOMINAL AND TRANSVAGINAL ULTRASOUND OF PELVIS TECHNIQUE: Both transabdominal and transvaginal ultrasound examinations of the pelvis were performed. Transabdominal technique was performed for global imaging of the pelvis including uterus, ovaries, adnexal regions, and pelvic cul-de-sac. It was necessary to proceed with endovaginal exam following the transabdominal exam to visualize the endometrium and ovaries to better advantage. COMPARISON:  None. FINDINGS: Uterus Measurements: 9.6 x 5.0 x 6.6 cm = volume: 166 mL. Heterogeneous appearance of the endometrium with no defined mass. Cervix unremarkable. Endometrium Thickness: 8 mm. Fluid distends the endocervical canal, maximum distension of 7 mm. Within the fluid is an echogenic rounded focus that measures 7 mm in size. Endometrial lining, where bordered by the fluid, is irregular. Right ovary Measurements: 1.8 x 2.8 x 2.0 cm = volume: 5.2 mL. Normal appearance/no adnexal mass. Left ovary Measurements: 2.5 x 2.6 x 2.2 cm = volume: 7.4 mL. Normal appearance/no adnexal mass. Other findings No abnormal free fluid. IMPRESSION: 1. Fluid  distends the endometrial canal. Endometrial canal lining appears irregular. There is a 7 mm echogenic focus within the endometrial canal that may reflect a polyp. Consider further evaluation with sonohysterogram for confirmation prior to hysteroscopy. Endometrial sampling should also be considered if patient is at high risk for endometrial carcinoma. (Ref: Radiological Reasoning: Algorithmic Workup of Abnormal Vaginal Bleeding with Endovaginal Sonography and Sonohysterography. AJR 2008; 270:J50-09) 2. Heterogeneous echogenicity of the uterus which is nonspecific. 3. No other abnormalities.  Normal ovaries and adnexa. Electronically Signed   By: Lajean Manes M.D.   On: 06/06/2022 14:29    Procedures Procedures    Medications Ordered in ED Medications - No data to display  ED Course/ Medical Decision Making/ A&P  Medical Decision Making Amount and/or Complexity of Data Reviewed Labs: ordered. Radiology: ordered.   66:13 PM 41 year old female presenting for suprapubic abdominal pain.  Is alert and oriented x3, no acute distress, afebrile, stable vital signs.  Abdomen is minimally tender with deep to suprapubic region.    Urine pregnancy negative.  Hemoglobin stable.  UA demonstrates no UTI.  Positive for blood.  Likely vaginal in nature.    Transvaginal ultrasound demonstrates: 1. Fluid distends the endometrial canal. Endometrial canal lining appears irregular. There is a 7 mm echogenic focus within the endometrial canal that may reflect a polyp. Consider further evaluation with sonohysterogram for confirmation prior to hysteroscopy. Endometrial sampling should also be considered if patient is at high risk for endometrial carcinoma. (Ref: Radiological Reasoning: Algorithmic Workup of Abnormal Vaginal Bleeding with Endovaginal Sonography and Sonohysterography. AJR 2008; 390:Z00-92) 2. Heterogeneous echogenicity of the uterus which is nonspecific. 3. No other  abnormalities.  Normal ovaries and adnexa.    Patient recommended for close follow-up with her OB.  Has an appointment in 10 days for ultrasound findings. Patient in no distress and overall condition improved here in the ED. Detailed discussions were had with the patient regarding current findings, and need for close f/u with PCP or on call doctor. The patient has been instructed to return immediately if the symptoms worsen in any way for re-evaluation. Patient verbalized understanding and is in agreement with current care plan. All questions answered prior to discharge.        Final Clinical Impression(s) / ED Diagnoses Final diagnoses:  Suprapubic pain  Vaginal bleeding    Rx / DC Orders ED Discharge Orders     None         Lianne Cure, DO 33/00/76 1528

## 2022-06-07 ENCOUNTER — Telehealth: Payer: Self-pay | Admitting: Family Medicine

## 2022-06-07 NOTE — Telephone Encounter (Signed)
Returned patients call. She had an [place] yesterday with abdominal pain and noted to have a polyp. She has an appointment on 6/30 with Dr. Harolyn Rutherford. She is concerned with [place] results. Appointment note update to reflect concerns about [place]. She has no other questions or concerns at this time.

## 2022-06-07 NOTE — Telephone Encounter (Signed)
Patient would like a nurse to call her back regarding her urgent care visit yesterday

## 2022-06-17 ENCOUNTER — Encounter: Payer: Self-pay | Admitting: Obstetrics & Gynecology

## 2022-06-17 ENCOUNTER — Other Ambulatory Visit (HOSPITAL_COMMUNITY)
Admission: RE | Admit: 2022-06-17 | Discharge: 2022-06-17 | Disposition: A | Discharge status: Home / Self Care | Payer: 59 | Source: Ambulatory Visit | Attending: Obstetrics & Gynecology | Admitting: Obstetrics & Gynecology

## 2022-06-17 ENCOUNTER — Ambulatory Visit: Payer: 59 | Admitting: Obstetrics & Gynecology

## 2022-06-17 ENCOUNTER — Other Ambulatory Visit: Payer: Self-pay

## 2022-06-17 VITALS — BP 127/89 | HR 80 | Ht 65.0 in | Wt 240.2 lb

## 2022-06-17 DIAGNOSIS — Z01419 Encounter for gynecological examination (general) (routine) without abnormal findings: Secondary | ICD-10-CM

## 2022-06-17 DIAGNOSIS — Z113 Encounter for screening for infections with a predominantly sexual mode of transmission: Secondary | ICD-10-CM | POA: Insufficient documentation

## 2022-06-17 DIAGNOSIS — N939 Abnormal uterine and vaginal bleeding, unspecified: Secondary | ICD-10-CM | POA: Diagnosis not present

## 2022-06-17 NOTE — Patient Instructions (Signed)
Minerva/Novasure  Hydrothermal Ablation

## 2022-06-17 NOTE — Progress Notes (Signed)
GYNECOLOGY ANNUAL PREVENTATIVE CARE ENCOUNTER NOTE  History:     Peggy Schmidt is a 41 y.o. G0P0000 female here for a routine annual gynecologic exam.  Current complaints: continue AUB, she stopped Depo Provera that she was on, last injection was 12/04/2020.  Periods are irregular but and not as heavy, but she does not like the irregularity.  Had an ultrasound earlier this month that showed 8 mm endometrial stripe but possible 7 mm polyp. She wants to discuss surgical management today.   Denies abnormal vaginal discharge, pelvic pain, problems with intercourse or other gynecologic concerns.    Gynecologic History No LMP recorded. Contraception: none, in same-sex partner relationships Last Pap: 01/13/2020. Result was normal with negative HPV  Obstetric History OB History  Gravida Para Term Preterm AB Living  0 0 0 0 0 0  SAB IAB Ectopic Multiple Live Births  0 0 0 0 0    Past Medical History:  Diagnosis Date   Abnormal uterine bleeding    DM (diabetes mellitus), type 2 ([place])    Hay fever    Hypertension    pt states her blood pressure has been up and down for the last year   Post concussion syndrome 2017    Past Surgical History:  Procedure Laterality Date   ANKLE FRACTURE SURGERY      Current Outpatient Medications on File Prior to Visit  Medication Sig Dispense Refill   Accu-Chek Softclix Lancets lancets 1 each by Other route 3 (three) times daily. E11.9 102 each 11   fluticasone (FLONASE) 50 MCG/ACT nasal spray Place 2 sprays into both nostrils  daily. 16 g 6   glucose blood test strip Use TID as needed for glucose check  E11.9 100 each 12   olmesartan-hydrochlorothiazide (BENICAR HCT) 20-12.5 MG tablet Take 1 tablet by mouth daily. 90 tablet 0   pantoprazole (PROTONIX) 40 MG tablet Take 1 tablet (40 mg total) by mouth daily. 30 tablet 3   Semaglutide, 1 MG/DOSE, 4 MG/3ML SOPN Inject 1 mg as directed once a week. 9 mL 3   triamcinolone cream (KENALOG) 0.1 % Apply 1 application. topically 2 (two) times daily. 30 g 1   No current facility-administered medications on file prior to visit.    Allergies  Allergen Reactions   Other     Black walnuts     Social History:  reports that she has never smoked. She has never used smokeless tobacco. She reports that she does not drink alcohol and does not use drugs.  Family History  Problem Relation Age of Onset   Hypertension Mother    Diabetes Mother    Depression Mother    Hyperlipidemia Mother    Hypertension Brother    Hypertension Maternal Grandmother    Diabetes Father    Hypertension Father     The following portions of the patient's history were reviewed and updated as appropriate: allergies, current medications, past family history, past medical history, past social history, past surgical history and problem list.  Review of Systems Pertinent items noted in HPI and remainder of comprehensive ROS otherwise negative.  Physical Exam:  BP 127/89   Pulse 80   Ht '5\' 5"'$  (1.651 m)   Wt 240 lb 3.2 oz (109 kg)   BMI 39.97 kg/m  CONSTITUTIONAL: Well-developed, well-nourished female in no acute distress.  HENT:  Normocephalic, atraumatic, External right and left ear normal.  EYES: Conjunctivae and EOM are normal. Pupils are equal, round, and reactive to light.  No scleral icterus.  NECK: Normal range of motion, supple, no masses.  Normal thyroid.  SKIN: Skin is warm and dry. No rash noted. Not diaphoretic. No erythema. No pallor. MUSCULOSKELETAL: Normal range of motion. No  tenderness.  No cyanosis, clubbing, or edema. NEUROLOGIC: Alert and oriented to person, place, and time. Normal reflexes, muscle tone coordination.  PSYCHIATRIC: Normal mood and affect. Normal behavior. Normal judgment and thought content. CARDIOVASCULAR: Normal heart rate noted, regular rhythm RESPIRATORY: Clear to auscultation bilaterally. Effort and breath sounds normal, no problems with respiration noted. BREASTS: Symmetric in size. No masses, tenderness, skin changes, nipple drainage, or lymphadenopathy bilaterally. Performed in the presence of a chaperone. ABDOMEN: Soft, no distention noted.  No tenderness, rebound or guarding.  PELVIC: Normal appearing external genitalia and urethral meatus; normal appearing vaginal mucosa and cervix.  No abnormal vaginal discharge noted.  Pap smear obtained.  Normal uterine size, no other palpable masses, no uterine or adnexal tenderness.  Performed in the presence of a chaperone.  Imaging: US PELVIC COMPLETE WITH TRANSVAGINAL  Result Date: 06/06/2022 CLINICAL DATA:  Vaginal spotting since being placed on a Depo shot. EXAM: TRANSABDOMINAL AND TRANSVAGINAL ULTRASOUND OF PELVIS TECHNIQUE: Both transabdominal and transvaginal ultrasound examinations of the pelvis were performed. Transabdominal technique was performed for global imaging of the pelvis including uterus, ovaries, adnexal regions, and pelvic cul-de-sac. It was necessary to proceed with endovaginal exam following the transabdominal exam to visualize the endometrium and ovaries to better advantage. COMPARISON:  None. FINDINGS: Uterus Measurements: 9.6 x 5.0 x 6.6 cm = volume: 166 mL. Heterogeneous appearance of the endometrium with no defined mass. Cervix unremarkable. Endometrium Thickness: 8 mm. Fluid distends the endocervical canal, maximum distension of 7 mm. Within the fluid is an echogenic rounded focus that measures 7 mm in size. Endometrial lining, where bordered by the fluid, is irregular. Right  ovary Measurements: 1.8 x 2.8 x 2.0 cm = volume: 5.2 mL. Normal appearance/no adnexal mass. Left ovary Measurements: 2.5 x 2.6 x 2.2 cm = volume: 7.4 mL. Normal appearance/no adnexal mass. Other findings No abnormal free fluid. IMPRESSION: 1. Fluid distends the endometrial canal. Endometrial canal lining appears irregular. There is a 7 mm echogenic focus within the endometrial canal that may reflect a polyp. Consider further evaluation with sonohysterogram for confirmation prior to hysteroscopy. Endometrial sampling should also be considered if patient is at high risk for endometrial carcinoma. (Ref: Radiological Reasoning: Algorithmic Workup of Abnormal Vaginal Bleeding with Endovaginal Sonography and Sonohysterography. AJR 2008; 376:E83-15) 2. Heterogeneous echogenicity of the uterus which is nonspecific. 3. No other abnormalities.  Normal ovaries and adnexa. Electronically Signed   By: Lajean Manes M.D.   On: 06/06/2022 14:29      Assessment and Plan:     1. Abnormal uterine bleeding (AUB) Discussed management options for abnormal uterine bleeding including NSAIDs (Naproxen), tranexamic acid (Lysteda), oral progesterone, Depo Provera, Levonogestrel IUD, endometrial ablation or hysterectomy as definitive surgical management.  Discussed risks and benefits of each method.   Patient desires endometrial ablation. Recommended Hydrothermal Endometrial ablation, procedure was discussed.  Risks of surgery were discussed with the patient including but not limited to: bleeding; infection which may require antibiotics; injury to uterus leading to risk of injury to surrounding intraperitoneal organs, burn injury to vagina or other organs, need for additional procedures including laparoscopy or laparotomy, inability to complete ablation due to uterine or mechanical anomaly, need for trial of another ablation modality, and other postoperative/anesthesia complications.  Patient was told that the likelihood  that her  condition and symptoms will be treated effectively with this surgical management was very high; the postoperative expectations were also discussed in detail.  Patient was told that it is normal to have menstrual bleeding after an endometrial ablation, only about 50% of patients become amenorrheic, 40% of patients have normal or light periods, and 5-10% of patients have no change in their bleeding pattern and may need further intervention. Patient also verified she is done with childbearing as this procedure is contraindicated for patient desiring future fertility. The patient also understands the alternative treatment options which were discussed in full. All questions were answered.  She was told that she will be contacted by our surgical scheduler regarding the time and date of her surgery; routine preoperative instructions will be given to her later.  Printed patient education handouts about the procedure were given to the patient to review at home.  She declined any other therapy in the meantime.  CBC and TSH checked today, recommended preoperative endometrial biopsy.  Patient will return for this, premedication with Ibuprofen recommended. Bleeding precautions reviewed.  - CBC - TSH Rfx on Abnormal to Free T4  I spent 30 minutes dedicated to the care of this patient including pre-visit review of records, face to face time with the patient discussing her abnormal uterine bleeding and treatments and post visit ordering of testing.  2. Routine screening for STI (sexually transmitted infection) STI screen done as per patient's request, will follow up results and manage accordingly. - Cytology - PAP( [place]) - RPR+HBsAg+HCVAb+...  3. Well woman exam with routine gynecological exam - Cytology - PAP( [place] [facility])  Will follow up results of pap smear and manage accordingly. Routine preventative health maintenance measures emphasized. Please refer to After Visit Summary for other counseling  recommendations.      Verita Schneiders, MD, [place] for [employer], [place]

## 2022-06-18 LAB — RPR+HBSAG+HCVAB+...
HIV Screen 4th Generation wRfx: NONREACTIVE
Hep C Virus Ab: NONREACTIVE
Hepatitis B Surface Ag: NEGATIVE
RPR Ser Ql: NONREACTIVE

## 2022-06-18 LAB — CBC
Hematocrit: 42.3 % (ref 34.0–46.6)
Hemoglobin: 14.3 g/dL (ref 11.1–15.9)
MCH: 28 pg (ref 26.6–33.0)
MCHC: 33.8 g/dL (ref 31.5–35.7)
MCV: 83 fL (ref 79–97)
Platelets: 372 10*3/uL (ref 150–450)
RBC: 5.11 x10E6/uL (ref 3.77–5.28)
RDW: 12.5 % (ref 11.7–15.4)
WBC: 7.8 10*3/uL (ref 3.4–10.8)

## 2022-06-18 LAB — TSH RFX ON ABNORMAL TO FREE T4: TSH: 0.699 u[IU]/mL (ref 0.450–4.500)

## 2022-06-20 LAB — CYTOLOGY - PAP
Chlamydia: NEGATIVE
Comment: NEGATIVE
Comment: NEGATIVE
Comment: NEGATIVE
Comment: NORMAL
Diagnosis: NEGATIVE
High risk HPV: NEGATIVE
Neisseria Gonorrhea: NEGATIVE
Trichomonas: NEGATIVE

## 2022-06-24 ENCOUNTER — Telehealth: Payer: Self-pay | Admitting: Family Medicine

## 2022-06-24 ENCOUNTER — Ambulatory Visit: Payer: 59 | Admitting: Adult Health

## 2022-06-24 NOTE — Telephone Encounter (Signed)
CAn we clarify?  Last time she was prescribed a muscle relaxer was in 2018 and this was not by [place].

## 2022-06-24 NOTE — Telephone Encounter (Signed)
Please advise 

## 2022-06-24 NOTE — Telephone Encounter (Signed)
Patient aware no Rx done by PCP Stated she has neck pain and shoulder  Transfer to front office for appointment

## 2022-06-24 NOTE — Telephone Encounter (Signed)
Pt she thinks she was once prescribed a muscle relaxant but never picked it up from the pharmacy.   Pt requests call back with information about medications.   FO Rep offered other outlets for information like MyChart or making an appointment, pt declined.  FO Rep explained there is no guarantee she will get a call back today, pt stated understanding.

## 2022-06-27 ENCOUNTER — Encounter: Payer: Self-pay | Admitting: Family Medicine

## 2022-06-27 ENCOUNTER — Ambulatory Visit: Payer: 59 | Admitting: Family Medicine

## 2022-06-27 VITALS — BP 128/80 | HR 92 | Temp 97.5°F | Ht 65.0 in | Wt 245.0 lb

## 2022-06-27 DIAGNOSIS — M542 Cervicalgia: Secondary | ICD-10-CM | POA: Diagnosis not present

## 2022-06-27 DIAGNOSIS — Z3042 Encounter for surveillance of injectable contraceptive: Secondary | ICD-10-CM | POA: Diagnosis not present

## 2022-06-27 DIAGNOSIS — N939 Abnormal uterine and vaginal bleeding, unspecified: Secondary | ICD-10-CM

## 2022-06-27 LAB — POCT URINE PREGNANCY: Preg Test, Ur: NEGATIVE

## 2022-06-27 MED ORDER — CYCLOBENZAPRINE HCL 10 MG PO TABS: 10.0000 mg | ORAL_TABLET | Freq: Three times a day (TID) | ORAL | 0 refills | Status: AC | PRN

## 2022-06-27 MED ORDER — IBUPROFEN 800 MG PO TABS: 800.0000 mg | ORAL_TABLET | Freq: Three times a day (TID) | ORAL | 0 refills | Status: DC | PRN

## 2022-06-27 MED ORDER — MEDROXYPROGESTERONE ACETATE 150 MG/ML IM SUSP
150.0000 mg | Freq: Once | INTRAMUSCULAR | Status: AC
  Administered 2022-06-27: 150 mg via INTRAMUSCULAR

## 2022-06-27 NOTE — Patient Instructions (Signed)
It was very nice to see you today!  We will restart your Depo-Provera shots.  Please start the ibuprofen and Flexeril.  Please come back in a few months to recheck your A1c.  Take care, Dr [name]  PLEASE NOTE:  If you had any lab tests please let us know if you have not heard back within a few days. You may see your results on mychart before we have a chance to review them but we will give you a call once they are reviewed by [place]. If we ordered any referrals today, please let us know if you have not heard from their office within the next week.   Please try these tips to maintain a healthy lifestyle:  Eat at least 3 REAL meals and 1-2 snacks per day.  Aim for no more than 5 hours between eating.  If you eat breakfast, please do so within one hour of getting up.   Each meal should contain half fruits/vegetables, one quarter protein, and one quarter carbs (no bigger than a computer mouse)  Cut down on sweet beverages. This includes juice, soda, and sweet tea.   Drink at least 1 glass of water with each meal and aim for at least 8 glasses per day  Exercise at least 150 minutes every week.
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
## 2022-06-27 NOTE — Assessment & Plan Note (Signed)
She will be following up with gynecology soon for definitive management.  She would like to restart her Depo-Provera. Urine pregnancy test today was negative.  Injection of Depo-Provera was given.  We will defer further management to gynecology.

## 2022-07-12 ENCOUNTER — Telehealth: Payer: Self-pay | Admitting: Lactation Services

## 2022-07-12 ENCOUNTER — Telehealth: Payer: Self-pay

## 2022-07-12 ENCOUNTER — Encounter: Payer: Self-pay | Admitting: Obstetrics & Gynecology

## 2022-07-12 NOTE — Telephone Encounter (Signed)
Received a fwd mychart message from patient:  "Hi, I wanted to contact someone about the letter I had received from [facility]. I was schedule for  August 17, 2022, but I never talked to anyone about it nor did I agree to it. I have been trying to contact her since last Monday. I have left messages but got no response back. I would like for that surgery to be canceled. I am still thinking about what I want to do. Thank you.."  Called patient, no answer, left voicemail informing patient I received her message and at her request I am cancelling her surgery on 08/30 and if she has additional healthcare needs in the future she can contact the office to make an appointment w/ Dr. Harolyn Rutherford.

## 2022-07-12 NOTE — Telephone Encounter (Signed)
Called patient to let her know her Surgery has been cancelled. She did not answer. LM for her to check her MyChart for message from Dr. Harolyn Rutherford. Asked her to call the office if she has any further questions or concerns.

## 2022-08-17 ENCOUNTER — Encounter (HOSPITAL_BASED_OUTPATIENT_CLINIC_OR_DEPARTMENT_OTHER): Payer: Self-pay

## 2022-08-17 ENCOUNTER — Ambulatory Visit (HOSPITAL_BASED_OUTPATIENT_CLINIC_OR_DEPARTMENT_OTHER): Admit: 2022-08-17 | Payer: 59 | Admitting: Obstetrics & Gynecology

## 2022-08-17 SURGERY — DILATATION & CURETTAGE/HYSTEROSCOPY WITH HYDROTHERMAL ABLATION
Anesthesia: Choice

## 2022-08-25 ENCOUNTER — Ambulatory Visit: Payer: 59 | Admitting: Family Medicine

## 2022-08-25 ENCOUNTER — Other Ambulatory Visit: Payer: Self-pay

## 2022-08-25 ENCOUNTER — Encounter: Payer: Self-pay | Admitting: Family Medicine

## 2022-08-25 DIAGNOSIS — N939 Abnormal uterine and vaginal bleeding, unspecified: Secondary | ICD-10-CM | POA: Diagnosis not present

## 2022-08-25 NOTE — Progress Notes (Signed)
   Subjective:    Patient ID: Peggy Schmidt is a 41 y.o. female presenting with Pre-op Exam  on 08/25/2022  HPI: Here for pre-op eval. Has on-going bleeding with presumed polyp. She is on depo and continues to bleed. Previously scheduled for ablation but does not desire this.  Review of Systems  Constitutional:  Negative for chills and fever.  Respiratory:  Negative for shortness of breath.   Cardiovascular:  Negative for chest pain.  Gastrointestinal:  Negative for abdominal pain, nausea and vomiting.  Genitourinary:  Positive for vaginal bleeding. Negative for dysuria.  Skin:  Negative for rash.      Objective:    BP 119/88   Pulse 97   Wt 236 lb 4.8 oz (107.2 kg)   BMI 39.32 kg/m  Physical Exam Exam conducted with a chaperone present.  Constitutional:      General: She is not in acute distress.    Appearance: She is well-developed.  HENT:     Head: Normocephalic and atraumatic.  Eyes:     General: No scleral icterus. Cardiovascular:     Rate and Rhythm: Normal rate.  Pulmonary:     Effort: Pulmonary effort is normal.  Abdominal:     Palpations: Abdomen is soft.  Musculoskeletal:     Cervical back: Neck supple.  Skin:    General: Skin is warm and dry.  Neurological:     Mental Status: She is alert and oriented to person, place, and time.         Assessment & Plan:   Problem List Items Addressed This Visit       Unprioritized   Abnormal uterine bleeding (AUB)    For D & C with Hysteroscopy and polyp removal. Discussed surgery, procedure, usual R/B/A and usual recovery. Questions answered.       Return in about 4 weeks (around 09/22/2022) for postop check.  Donnamae Jude, MD 08/25/2022 8:24 AM

## 2022-08-25 NOTE — Assessment & Plan Note (Signed)
For D & C with Hysteroscopy and polyp removal. Discussed surgery, procedure, usual R/B/A and usual recovery. Questions answered.

## 2022-08-29 ENCOUNTER — Encounter (HOSPITAL_BASED_OUTPATIENT_CLINIC_OR_DEPARTMENT_OTHER): Payer: Self-pay | Admitting: Obstetrics & Gynecology

## 2022-08-29 NOTE — Progress Notes (Addendum)
Addendum:  pt called to get pre-op instructions again, noted start time change.  Pt verbalized understanding to arrive at 1330, be npo after mn with exception clear liquids until 1230.  Spoke w/ via phone for pre-op interview--- pt Lab needs dos---- Peggy Schmidt, ekg, urine preg (per anes)/  pre-op orders pending              Lab results------ no COVID test -----patient states asymptomatic no test needed Arrive at ------- 1400 on 09-14-2022 NPO after MN NO Solid Food.  Clear liquids from MN until---  1300  Med rec completed Medications to take morning of surgery ----- protonix Diabetic medication ----- do not do ozempic sundey prior to surgery so last dose will be 09-08-2022 Patient instructed no nail polish to be worn day of surgery Patient instructed to bring photo id and insurance card day of surgery Patient aware to have Driver (ride ) / caregiver  for 24 hours after surgery -- friend, Peggy Schmidt Patient Special Instructions ----- n/a Pre-Op special Istructions ----- sent inbox message to dr Harolyn Rutherford in epic , requested orders Patient verbalized understanding of instructions that were given at this phone interview. Patient denies shortness of breath, chest pain, fever, cough at this phone interview.

## 2022-09-14 ENCOUNTER — Ambulatory Visit (HOSPITAL_BASED_OUTPATIENT_CLINIC_OR_DEPARTMENT_OTHER): Payer: 59 | Admitting: Anesthesiology

## 2022-09-14 ENCOUNTER — Other Ambulatory Visit: Payer: Self-pay

## 2022-09-14 ENCOUNTER — Encounter (HOSPITAL_BASED_OUTPATIENT_CLINIC_OR_DEPARTMENT_OTHER)
Admission: RE | Disposition: A | Discharge status: Home / Self Care | Payer: Self-pay | Source: Ambulatory Visit | Attending: Obstetrics & Gynecology

## 2022-09-14 ENCOUNTER — Ambulatory Visit (HOSPITAL_BASED_OUTPATIENT_CLINIC_OR_DEPARTMENT_OTHER)
Admission: RE | Admit: 2022-09-14 | Discharge: 2022-09-14 | Disposition: A | Discharge status: Home / Self Care | Payer: 59 | Source: Ambulatory Visit | Attending: Obstetrics & Gynecology | Admitting: Obstetrics & Gynecology

## 2022-09-14 ENCOUNTER — Encounter (HOSPITAL_BASED_OUTPATIENT_CLINIC_OR_DEPARTMENT_OTHER): Payer: Self-pay | Admitting: Obstetrics & Gynecology

## 2022-09-14 DIAGNOSIS — E119 Type 2 diabetes mellitus without complications: Secondary | ICD-10-CM | POA: Insufficient documentation

## 2022-09-14 DIAGNOSIS — G4730 Sleep apnea, unspecified: Secondary | ICD-10-CM | POA: Diagnosis not present

## 2022-09-14 DIAGNOSIS — I10 Essential (primary) hypertension: Secondary | ICD-10-CM | POA: Diagnosis not present

## 2022-09-14 DIAGNOSIS — N939 Abnormal uterine and vaginal bleeding, unspecified: Secondary | ICD-10-CM | POA: Diagnosis present

## 2022-09-14 DIAGNOSIS — E669 Obesity, unspecified: Secondary | ICD-10-CM | POA: Insufficient documentation

## 2022-09-14 DIAGNOSIS — N8000 Endometriosis of the uterus, unspecified: Secondary | ICD-10-CM | POA: Diagnosis not present

## 2022-09-14 DIAGNOSIS — N840 Polyp of corpus uteri: Secondary | ICD-10-CM

## 2022-09-14 DIAGNOSIS — Z01818 Encounter for other preprocedural examination: Secondary | ICD-10-CM

## 2022-09-14 DIAGNOSIS — N711 Chronic inflammatory disease of uterus: Secondary | ICD-10-CM | POA: Insufficient documentation

## 2022-09-14 DIAGNOSIS — Z7985 Long-term (current) use of injectable non-insulin antidiabetic drugs: Secondary | ICD-10-CM

## 2022-09-14 HISTORY — DX: Obstructive sleep apnea (adult) (pediatric): G47.33

## 2022-09-14 HISTORY — PX: HYSTEROSCOPY WITH D & C: SHX1775

## 2022-09-14 HISTORY — DX: Hyperlipidemia, unspecified: E78.5

## 2022-09-14 HISTORY — DX: Presence of spectacles and contact lenses: Z97.3

## 2022-09-14 LAB — POCT I-STAT, CHEM 8
BUN: 13 mg/dL (ref 6–20)
Calcium, Ion: 1.07 mmol/L — ABNORMAL LOW (ref 1.15–1.40)
Chloride: 108 mmol/L (ref 98–111)
Creatinine, Ser: 0.7 mg/dL (ref 0.44–1.00)
Glucose, Bld: 96 mg/dL (ref 70–99)
HCT: 41 % (ref 36.0–46.0)
Hemoglobin: 13.9 g/dL (ref 12.0–15.0)
Potassium: 3.4 mmol/L — ABNORMAL LOW (ref 3.5–5.1)
Sodium: 142 mmol/L (ref 135–145)
TCO2: 22 mmol/L (ref 22–32)

## 2022-09-14 LAB — POCT PREGNANCY, URINE: Preg Test, Ur: NEGATIVE

## 2022-09-14 LAB — CBC
HCT: 41 % (ref 36.0–46.0)
Hemoglobin: 13.5 g/dL (ref 12.0–15.0)
MCH: 28.6 pg (ref 26.0–34.0)
MCHC: 32.9 g/dL (ref 30.0–36.0)
MCV: 86.9 fL (ref 80.0–100.0)
Platelets: 343 10*3/uL (ref 150–400)
RBC: 4.72 MIL/uL (ref 3.87–5.11)
RDW: 13.2 % (ref 11.5–15.5)
WBC: 7.3 10*3/uL (ref 4.0–10.5)
nRBC: 0 % (ref 0.0–0.2)

## 2022-09-14 LAB — GLUCOSE, CAPILLARY
Glucose-Capillary: 62 mg/dL — ABNORMAL LOW (ref 70–99)
Glucose-Capillary: 79 mg/dL (ref 70–99)
Glucose-Capillary: 162 mg/dL — ABNORMAL HIGH (ref 70–99)

## 2022-09-14 SURGERY — DILATATION AND CURETTAGE /HYSTEROSCOPY
Anesthesia: General | Site: Vagina

## 2022-09-14 MED ORDER — BUPIVACAINE HCL 0.5 % IJ SOLN
INTRAMUSCULAR | Status: DC | PRN
  Administered 2022-09-14: 30 mL

## 2022-09-14 MED ORDER — HYDROMORPHONE HCL 1 MG/ML IJ SOLN: 0.2500 mg | INTRAMUSCULAR | Status: DC | PRN

## 2022-09-14 MED ORDER — LIDOCAINE 2% (20 MG/ML) 5 ML SYRINGE
INTRAMUSCULAR | Status: DC | PRN
  Administered 2022-09-14: 100 mg via INTRAVENOUS

## 2022-09-14 MED ORDER — MIDAZOLAM HCL 5 MG/5ML IJ SOLN
INTRAMUSCULAR | Status: DC | PRN
  Administered 2022-09-14: 2 mg via INTRAVENOUS

## 2022-09-14 MED ORDER — OXYCODONE HCL 5 MG PO TABS: 5.0000 mg | ORAL_TABLET | Freq: Four times a day (QID) | ORAL | 0 refills | Status: DC | PRN

## 2022-09-14 MED ORDER — CELECOXIB 200 MG PO CAPS
400.0000 mg | ORAL_CAPSULE | ORAL | Status: AC
  Administered 2022-09-14: 400 mg via ORAL

## 2022-09-14 MED ORDER — ACETAMINOPHEN 500 MG PO TABS
1000.0000 mg | ORAL_TABLET | ORAL | Status: AC
  Administered 2022-09-14: 1000 mg via ORAL

## 2022-09-14 MED ORDER — ONDANSETRON HCL 4 MG/2ML IJ SOLN
INTRAMUSCULAR | Status: DC | PRN
  Administered 2022-09-14: 4 mg via INTRAVENOUS

## 2022-09-14 MED ORDER — OXYCODONE HCL 5 MG/5ML PO SOLN: 5.0000 mg | Freq: Once | ORAL | Status: DC | PRN

## 2022-09-14 MED ORDER — IBUPROFEN 800 MG PO TABS: 800.0000 mg | ORAL_TABLET | Freq: Three times a day (TID) | ORAL | 2 refills | Status: DC | PRN

## 2022-09-14 MED ORDER — FENTANYL CITRATE (PF) 100 MCG/2ML IJ SOLN
INTRAMUSCULAR | Status: DC | PRN
  Administered 2022-09-14: 100 ug via INTRAVENOUS

## 2022-09-14 MED ORDER — DEXTROSE 50 % IV SOLN
25.0000 mL | Freq: Once | INTRAVENOUS | Status: AC
  Administered 2022-09-14: 25 mL via INTRAVENOUS

## 2022-09-14 MED ORDER — LACTATED RINGERS IV SOLN: INTRAVENOUS | Status: DC

## 2022-09-14 MED ORDER — LIDOCAINE HCL (PF) 2 % IJ SOLN
INTRAMUSCULAR | Status: AC
  Filled 2022-09-14: qty 5

## 2022-09-14 MED ORDER — DEXAMETHASONE SODIUM PHOSPHATE 10 MG/ML IJ SOLN
INTRAMUSCULAR | Status: DC | PRN
  Administered 2022-09-14: 4 mg via INTRAVENOUS

## 2022-09-14 MED ORDER — ACETAMINOPHEN 500 MG PO TABS
ORAL_TABLET | ORAL | Status: AC
  Filled 2022-09-14: qty 2

## 2022-09-14 MED ORDER — AMISULPRIDE (ANTIEMETIC) 5 MG/2ML IV SOLN: 10.0000 mg | Freq: Once | INTRAVENOUS | Status: DC | PRN

## 2022-09-14 MED ORDER — MEPERIDINE HCL 25 MG/ML IJ SOLN: 6.2500 mg | INTRAMUSCULAR | Status: DC | PRN

## 2022-09-14 MED ORDER — DEXAMETHASONE SODIUM PHOSPHATE 10 MG/ML IJ SOLN
INTRAMUSCULAR | Status: AC
  Filled 2022-09-14: qty 1

## 2022-09-14 MED ORDER — PROPOFOL 10 MG/ML IV BOLUS
INTRAVENOUS | Status: DC | PRN
  Administered 2022-09-14: 200 mg via INTRAVENOUS
  Administered 2022-09-14: 50 mg via INTRAVENOUS

## 2022-09-14 MED ORDER — GABAPENTIN 300 MG PO CAPS
300.0000 mg | ORAL_CAPSULE | ORAL | Status: AC
  Administered 2022-09-14: 300 mg via ORAL

## 2022-09-14 MED ORDER — FENTANYL CITRATE (PF) 100 MCG/2ML IJ SOLN
INTRAMUSCULAR | Status: AC
  Filled 2022-09-14: qty 2

## 2022-09-14 MED ORDER — ONDANSETRON HCL 4 MG/2ML IJ SOLN
INTRAMUSCULAR | Status: AC
  Filled 2022-09-14: qty 2

## 2022-09-14 MED ORDER — DEXTROSE 50 % IV SOLN
INTRAVENOUS | Status: AC
  Filled 2022-09-14: qty 50

## 2022-09-14 MED ORDER — GABAPENTIN 300 MG PO CAPS
ORAL_CAPSULE | ORAL | Status: AC
  Filled 2022-09-14: qty 1

## 2022-09-14 MED ORDER — KETOROLAC TROMETHAMINE 30 MG/ML IJ SOLN
INTRAMUSCULAR | Status: AC
  Filled 2022-09-14: qty 1

## 2022-09-14 MED ORDER — DOCUSATE SODIUM 100 MG PO CAPS: 100.0000 mg | ORAL_CAPSULE | Freq: Two times a day (BID) | ORAL | 2 refills | Status: DC | PRN

## 2022-09-14 MED ORDER — CELECOXIB 200 MG PO CAPS
ORAL_CAPSULE | ORAL | Status: AC
  Filled 2022-09-14: qty 2

## 2022-09-14 MED ORDER — PROMETHAZINE HCL 25 MG/ML IJ SOLN: 6.2500 mg | INTRAMUSCULAR | Status: DC | PRN

## 2022-09-14 MED ORDER — MIDAZOLAM HCL 2 MG/2ML IJ SOLN
INTRAMUSCULAR | Status: AC
  Filled 2022-09-14: qty 2

## 2022-09-14 MED ORDER — PROPOFOL 10 MG/ML IV BOLUS
INTRAVENOUS | Status: AC
  Filled 2022-09-14: qty 20

## 2022-09-14 MED ORDER — OXYCODONE HCL 5 MG PO TABS: 5.0000 mg | ORAL_TABLET | Freq: Once | ORAL | Status: DC | PRN

## 2022-09-14 SURGICAL SUPPLY — 15 items
CATH ROBINSON RED A/P 16FR (CATHETERS) ×1 IMPLANT
DEVICE MYOSURE LITE (MISCELLANEOUS) IMPLANT
DEVICE MYOSURE REACH (MISCELLANEOUS) IMPLANT
DRSG TELFA 3X8 NADH STRL (GAUZE/BANDAGES/DRESSINGS) ×1 IMPLANT
GAUZE 4X4 16PLY ~~LOC~~+RFID DBL (SPONGE) ×1 IMPLANT
GLOVE BIOGEL PI IND STRL 7.0 (GLOVE) ×2 IMPLANT
GLOVE ECLIPSE 7.0 STRL STRAW (GLOVE) ×1 IMPLANT
GOWN STRL REUS W/TWL LRG LVL3 (GOWN DISPOSABLE) ×2 IMPLANT
IV NS IRRIG 3000ML ARTHROMATIC (IV SOLUTION) IMPLANT
KIT PROCEDURE FLUENT (KITS) ×1 IMPLANT
KIT TURNOVER CYSTO (KITS) ×1 IMPLANT
PACK VAGINAL MINOR WOMEN LF (CUSTOM PROCEDURE TRAY) ×1 IMPLANT
PAD OB MATERNITY 4.3X12.25 (PERSONAL CARE ITEMS) ×1 IMPLANT
SEAL ROD LENS SCOPE MYOSURE (ABLATOR) ×1 IMPLANT
UNDERPAD 30X36 HEAVY ABSORB (UNDERPADS AND DIAPERS) ×1 IMPLANT

## 2022-09-14 NOTE — Anesthesia Preprocedure Evaluation (Signed)
Anesthesia Evaluation  Patient identified by MRN, date of birth, ID band Patient awake    Reviewed: Allergy & Precautions, NPO status , Patient's Chart, lab work & pertinent test results  Airway Mallampati: II  TM Distance: >3 FB Neck ROM: Full    Dental no notable dental hx.    Pulmonary sleep apnea ,    Pulmonary exam normal breath sounds clear to auscultation       Cardiovascular hypertension, Pt. on medications negative cardio ROS Normal cardiovascular exam Rhythm:Regular Rate:Normal     Neuro/Psych negative neurological ROS  negative psych ROS   GI/Hepatic negative GI ROS, Neg liver ROS,   Endo/Other  negative endocrine ROSdiabetes, Type 2  Renal/GU negative Renal ROS  negative genitourinary   Musculoskeletal negative musculoskeletal ROS (+)   Abdominal (+) + obese,   Peds negative pediatric ROS (+)  Hematology negative hematology ROS (+)   Anesthesia Other Findings   Reproductive/Obstetrics negative OB ROS                             Anesthesia Physical Anesthesia Plan  ASA: 3  Anesthesia Plan: General   Post-op Pain Management: Tylenol PO (pre-op)*, Celebrex PO (pre-op)*, Gabapentin PO (pre-op)* and Toradol IV (intra-op)*   Induction: Intravenous  PONV Risk Score and Plan: 3 and Ondansetron, Dexamethasone, Midazolam and Treatment may vary due to age or medical condition  Airway Management Planned: LMA  Additional Equipment:   Intra-op Plan:   Post-operative Plan: Extubation in OR  Informed Consent: I have reviewed the patients History and Physical, chart, labs and discussed the procedure including the risks, benefits and alternatives for the proposed anesthesia with the patient or authorized representative who has indicated his/her understanding and acceptance.     Dental advisory given  Plan Discussed with: CRNA  Anesthesia Plan Comments:          Anesthesia Quick Evaluation

## 2022-09-14 NOTE — Discharge Instructions (Addendum)
      Post Anesthesia Home Care Instructions  Activity: Get plenty of rest for the remainder of the day. A responsible individual must stay with you for 24 hours following the procedure.  For the next 24 hours, DO NOT: -Drive a car -Paediatric nurse -Drink alcoholic beverages -Take any medication unless instructed by your physician -Make any legal decisions or sign important papers.  Meals: Start with liquid foods such as gelatin or soup. Progress to regular foods as tolerated. Avoid greasy, spicy, heavy foods. If nausea and/or vomiting occur, drink only clear liquids until the nausea and/or vomiting subsides. Call your physician if vomiting continues.  Special Instructions/Symptoms: Your throat may feel dry or sore from the anesthesia or the breathing tube placed in your throat during surgery. If this causes discomfort, gargle with warm salt water. The discomfort should disappear within 24 hours.       DISCHARGE INSTRUCTIONS: HYSTEROSCOPY / ENDOMETRIAL ABLATION The following instructions have been prepared to help you care for yourself upon your return home.  Do not take any Tylenol until after 5:45 pm today if needed. May take Ibuprofen after 7:45 pm today if needed  May take stool softner while taking narcotic pain medication to prevent constipation.  Drink plenty of water.  Personal hygiene:  Use sanitary pads for vaginal drainage, not tampons.  Shower the day after your procedure.  NO tub baths, pools or Jacuzzis for 2-3 weeks.  Wipe front to back after using the bathroom.  Activity and limitations:  Do NOT drive or operate any equipment for 24 hours. The effects of anesthesia are still present and drowsiness may result.  Do NOT rest in bed all day.  Walking is encouraged.  Walk up and down stairs slowly.  You may resume your normal activity in one to two days or as indicated by your physician. Sexual activity: NO intercourse for at least 2 weeks after the procedure,  or as indicated by your Doctor.  Diet: Eat a light meal as desired this evening. You may resume your usual diet tomorrow.  Return to Work: You may resume your work activities in one to two days or as indicated by Marine scientist.  What to expect after your surgery: Expect to have vaginal bleeding/discharge for 2-3 days and spotting for up to 10 days. It is not unusual to have soreness for up to 1-2 weeks. You may have a slight burning sensation when you urinate for the first day. Mild cramps may continue for a couple of days. You may have a regular period in 2-6 weeks.  Call your doctor for any of the following:  Excessive vaginal bleeding or clotting, saturating and changing one pad every hour.  Inability to urinate 6 hours after discharge from hospital.  Pain not relieved by pain medication.  Fever of 100.4 F or greater.  Unusual vaginal discharge or odor.
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
## 2022-09-14 NOTE — Transfer of Care (Signed)
Immediate Anesthesia Transfer of Care Note  Patient: Peggy Schmidt  Procedure(s) Performed: DILATATION AND CURETTAGE [name] RESECTION (Vagina )  Patient Location: PACU  Anesthesia Type:General  Level of Consciousness: sedated  Airway & Oxygen Therapy: Patient Spontanous Breathing and Patient connected to nasal cannula oxygen  Post-op Assessment: Report given to RN  Post vital signs: Reviewed and stable  Last Vitals:  Vitals Value Taken Time  BP 132/94   Temp    Pulse 80 09/14/22 1616  Resp 15 09/14/22 1616  SpO2 95 % 09/14/22 1616  Vitals shown include unvalidated device data.  Last Pain:  Vitals:   09/14/22 1332  TempSrc: Oral  PainSc: 0-No pain      Patients Stated Pain Goal: 6 (75/30/05 1102)  Complications: No notable events documented.

## 2022-09-14 NOTE — Op Note (Signed)
PREOPERATIVE DIAGNOSIS:  Abnormal uterine bleeding, endometrial polyp POSTOPERATIVE DIAGNOSIS: The same PROCEDURE: Hysteroscopy, Dilation and Curettage, Polypectomy using Myosure SURGEON:  Dr. Verita Schneiders   INDICATIONS: 41 y.o. G0 here for scheduled surgery for the aforementioned diagnoses.   Risks of surgery were discussed with the patient including but not limited to: bleeding which may require transfusion; infection which may require antibiotics; injury to uterus or surrounding organs; intrauterine scarring which may impair future fertility; need for additional procedures including laparotomy or laparoscopy; and other postoperative/anesthesia complications. Written informed consent was obtained.    FINDINGS:  A 10 week size uterus.  Diffuse proliferative endometrium.  1 cm polyp noted in fundal, left, posterior  endometrium. Normal ostia bilaterally.  ANESTHESIA:   General, paracervical block with 30 ml of 0.5% Marcaine ESTIMATED BLOOD LOSS:  Less than 20 ml SPECIMENS: Endometrial curettings and polyp fragments sent to pathology COMPLICATIONS:  None immediate.  PROCEDURE DETAILS:  The patient was then taken to the operating room where general anesthesia was administered and was found to be adequate.  After an adequate timeout was performed, she was placed in the dorsal lithotomy position and examined; then prepped and draped in the sterile manner.   Her bladder was catheterized for an unmeasured amount of clear, yellow urine. A speculum was then placed in the patient's vagina and a single tooth tenaculum was applied to the anterior lip of the cervix.   A paracervical block using 30 ml of 0.5% Marcaine was administered.  The uterus was sounded to 10 cm and the cervix was dilated manually with metal dilators to accommodate the 5 mm diagnostic hysteroscope.  The hysteroscope was then inserted under direct visualization using NS as a suspension medium.  The uterine cavity was carefully examined with  the findings as noted above. The Myosure device was introduced via the hysteroscope and used to resect the endometrial polyp.    After further careful visualization of the uterine cavity, the hysteroscope was removed under direct visualization.  A sharp curettage was then performed to obtain a moderate amount of endometrial curettings.  The tenaculum was removed from the anterior lip of the cervix and the vaginal speculum was removed after noting good hemostasis.  The patient tolerated the procedure well and was taken to the recovery area awake, extubated and in stable condition.  The patient will be discharged to home as per PACU criteria.  Routine postoperative instructions given.  She was prescribed Oxycodone, Ibuprofen and Colace.  She will follow up in the office on 09/22/2022  for postoperative evaluation.   Verita Schneiders, MD, [place] for [facility], [place]

## 2022-09-14 NOTE — Anesthesia Procedure Notes (Signed)
Procedure Name: LMA Insertion Date/Time: 09/14/2022 3:36 PM  Performed by: Bonney Aid, CRNAPre-anesthesia Checklist: Patient identified, Emergency Drugs available, Suction available and Patient being monitored Patient Re-evaluated:Patient Re-evaluated prior to induction Oxygen Delivery Method: Circle system utilized Preoxygenation: Pre-oxygenation with 100% oxygen Induction Type: IV induction Ventilation: Mask ventilation without difficulty LMA: LMA with gastric port inserted LMA Size: 4.0 Number of attempts: 1 Airway Equipment and Method: Bite block Placement Confirmation: positive ETCO2 Tube secured with: Tape Dental Injury: Teeth and Oropharynx as per pre-operative assessment

## 2022-09-14 NOTE — Anesthesia Postprocedure Evaluation (Signed)
Anesthesia Post Note  Patient: Peggy Schmidt  Procedure(s) Performed: DILATATION AND CURETTAGE [name] RESECTION (Vagina )     Patient location during evaluation: PACU Anesthesia Type: General Level of consciousness: awake Pain management: pain level controlled Respiratory status: spontaneous breathing Cardiovascular status: stable Postop Assessment: no apparent nausea or vomiting Anesthetic complications: no   No notable events documented.  Last Vitals:  Vitals:   09/14/22 1634 09/14/22 1640  BP:    Pulse: 71 83  Resp: 13 19  Temp:    SpO2: 99% 95%    Last Pain:  Vitals:   09/14/22 1332  TempSrc: Oral  PainSc: 0-No pain                 Jaretssi Kraker

## 2022-09-15 ENCOUNTER — Encounter: Payer: Self-pay | Admitting: Obstetrics & Gynecology

## 2022-09-15 ENCOUNTER — Encounter (HOSPITAL_BASED_OUTPATIENT_CLINIC_OR_DEPARTMENT_OTHER): Payer: Self-pay | Admitting: Obstetrics & Gynecology

## 2022-09-15 LAB — NO BLOOD PRODUCTS

## 2022-09-16 LAB — SURGICAL PATHOLOGY

## 2022-09-19 ENCOUNTER — Other Ambulatory Visit: Payer: Self-pay | Admitting: Obstetrics & Gynecology

## 2022-09-19 DIAGNOSIS — N939 Abnormal uterine and vaginal bleeding, unspecified: Secondary | ICD-10-CM

## 2022-09-19 DIAGNOSIS — N711 Chronic inflammatory disease of uterus: Secondary | ICD-10-CM

## 2022-09-19 MED ORDER — FLUCONAZOLE 150 MG PO TABS: 150.0000 mg | ORAL_TABLET | Freq: Once | ORAL | 1 refills | Status: DC

## 2022-09-19 MED ORDER — DOXYCYCLINE HYCLATE 100 MG PO CAPS: 100.0000 mg | ORAL_CAPSULE | Freq: Two times a day (BID) | ORAL | 0 refills | Status: AC

## 2022-09-22 ENCOUNTER — Encounter: Payer: Self-pay | Admitting: Obstetrics & Gynecology

## 2022-09-22 ENCOUNTER — Ambulatory Visit (INDEPENDENT_AMBULATORY_CARE_PROVIDER_SITE_OTHER): Payer: 59 | Admitting: Obstetrics & Gynecology

## 2022-09-22 ENCOUNTER — Other Ambulatory Visit: Payer: Self-pay

## 2022-09-22 ENCOUNTER — Encounter: Payer: Self-pay | Admitting: Family Medicine

## 2022-09-22 ENCOUNTER — Ambulatory Visit (INDEPENDENT_AMBULATORY_CARE_PROVIDER_SITE_OTHER): Payer: 59 | Admitting: Family Medicine

## 2022-09-22 VITALS — BP 125/81 | HR 81 | Wt 236.2 lb

## 2022-09-22 VITALS — BP 137/70 | HR 89 | Temp 98.0°F | Ht 65.0 in | Wt 237.2 lb

## 2022-09-22 DIAGNOSIS — E1159 Type 2 diabetes mellitus with other circulatory complications: Secondary | ICD-10-CM

## 2022-09-22 DIAGNOSIS — I152 Hypertension secondary to endocrine disorders: Secondary | ICD-10-CM

## 2022-09-22 DIAGNOSIS — Z09 Encounter for follow-up examination after completed treatment for conditions other than malignant neoplasm: Secondary | ICD-10-CM | POA: Diagnosis not present

## 2022-09-22 DIAGNOSIS — N939 Abnormal uterine and vaginal bleeding, unspecified: Secondary | ICD-10-CM | POA: Diagnosis not present

## 2022-09-22 DIAGNOSIS — N809 Endometriosis, unspecified: Secondary | ICD-10-CM

## 2022-09-22 DIAGNOSIS — E1169 Type 2 diabetes mellitus with other specified complication: Secondary | ICD-10-CM

## 2022-09-22 LAB — POCT GLYCOSYLATED HEMOGLOBIN (HGB A1C): Hemoglobin A1C: 6.4 % — AB (ref 4.0–5.6)

## 2022-09-22 NOTE — Progress Notes (Signed)
   GYNECOLOGY POSTOPERATIVE VISIT  Subjective:     Peggy Schmidt is a 41 y.o. female who presents to the clinic 2 weeks status post Hysteroscopy, Dilation and Curettage, Polypectomy using Myosure for Abnormal uterine bleeding, endometrial polyp. Having no issues, no pain reported.   The following portions of the patient's history were reviewed and updated as appropriate: allergies, current medications, past family history, past medical history, past social history, past surgical history, and problem list.  Review of Systems Pertinent items noted in HPI and remainder of comprehensive ROS otherwise negative.    Objective:    BP 125/81   Pulse 81   Wt 236 lb 3.2 oz (107.1 kg)   BMI 39.31 kg/m  General:  alert and no distress  Abdomen: soft, no masses, no tenderness reported  Pelvic:   none   09/14/2022 ENDOMETRIUM, CURETTAGE:  Chronic endometritis  Focal stromal breakdown consistent with bleeding  Abundant benign endocervical epithelium and endocervix  Negative for polyp, hyperplasia and carcinoma    Assessment:    Doing well postoperatively. Operative findings again reviewed. Pathology report discussed.    Plan:    1. Continue any current medications, was prescribed Doxycycline for chronic endometritis 2. Activity restrictions: none 4. Anticipated return to work: not applicable. 5. Follow up if bleeding becomes concerning again or for other gynecologic concerns.    Peggy Schneiders, MD, [place] for [facility], [place]

## 2022-09-22 NOTE — Assessment & Plan Note (Signed)
Blood pressure at goal today on olmesartan-HCTZ 20-12.5 once daily.  

## 2022-09-22 NOTE — Progress Notes (Signed)
   Peggy Schmidt is a 41 y.o. female who presents today for an office visit.  Assessment/Plan:  Chronic Problems Addressed Today: DM (diabetes mellitus), type 2 ([facility]) A1c well controlled at 6.4.  She is also lost about 8 pounds since her last office visit.  Congratulated patient on weight loss.  We will continue Ozempic 1 mg weekly.  Follow-up in 3 months.  Abnormal uterine bleeding (AUB) Recently had a hysteroscopy with polyp removal.  Tolerated well.  She would like to hold off on restarting Depo at this point to see if her cycles are more manageable now that she has had a polyp removed.  Hypertension associated with diabetes ([place]) Blood pressure at goal today on olmesartan-HCTZ 20-12.5 once daily.     Subjective:  HPI:  See A/p for status of chronic conditions.   She recently had hysteroscopy with polyp removal with OB/GYN.  Tolerated this well.  She has been compliant with her medications without any significant side effects.       Objective:  Physical Exam: BP 137/70   Pulse 89   Temp 98 F (36.7 C) (Temporal)   Ht '5\' 5"'$  (1.651 m)   Wt 237 lb 3.2 oz (107.6 kg)   SpO2 98%   BMI 39.47 kg/m   Wt Readings from Last 3 Encounters:  09/22/22 237 lb 3.2 oz (107.6 kg)  09/14/22 239 lb 8 oz (108.6 kg)  08/25/22 236 lb 4.8 oz (107.2 kg)    Gen: No acute distress, resting comfortably CV: Regular rate and rhythm with no murmurs appreciated Pulm: Normal work of breathing, clear to auscultation bilaterally with no crackles, wheezes, or rhonchi Neuro: Grossly normal, moves all extremities Psych: Normal affect and thought content      Syeda Prickett M. [name], MD 09/22/2022 11:22 AM

## 2022-09-22 NOTE — Assessment & Plan Note (Signed)
A1c well controlled at 6.4.  She is also lost about 8 pounds since her last office visit.  Congratulated patient on weight loss.  We will continue Ozempic 1 mg weekly.  Follow-up in 3 months.

## 2022-09-22 NOTE — Patient Instructions (Signed)
It was very nice to see you today!  Your A1c today is great.  Please keep up the good work your diet and exercise.  I will see you back in 3 months.  Come back sooner if needed.  Take care, Dr [name]  PLEASE NOTE:  If you had any lab tests please let us know if you have not heard back within a few days. You may see your results on mychart before we have a chance to review them but we will give you a call once they are reviewed by [place]. If we ordered any referrals today, please let us know if you have not heard from their office within the next week.   Please try these tips to maintain a healthy lifestyle:  Eat at least 3 REAL meals and 1-2 snacks per day.  Aim for no more than 5 hours between eating.  If you eat breakfast, please do so within one hour of getting up.   Each meal should contain half fruits/vegetables, one quarter protein, and one quarter carbs (no bigger than a computer mouse)  Cut down on sweet beverages. This includes juice, soda, and sweet tea.   Drink at least 1 glass of water with each meal and aim for at least 8 glasses per day  Exercise at least 150 minutes every week.

## 2022-09-22 NOTE — Assessment & Plan Note (Signed)
Recently had a hysteroscopy with polyp removal.  Tolerated well.  She would like to hold off on restarting Depo at this point to see if her cycles are more manageable now that she has had a polyp removed.

## 2022-10-03 ENCOUNTER — Ambulatory Visit (INDEPENDENT_AMBULATORY_CARE_PROVIDER_SITE_OTHER): Payer: 59

## 2022-10-03 DIAGNOSIS — Z789 Other specified health status: Secondary | ICD-10-CM

## 2022-10-03 MED ORDER — MEDROXYPROGESTERONE ACETATE 150 MG/ML IM SUSP
150.0000 mg | Freq: Once | INTRAMUSCULAR | Status: AC
  Administered 2022-10-03: 150 mg via INTRAMUSCULAR

## 2022-10-03 NOTE — Progress Notes (Signed)
Pt is here for Depo injection. Injection given in left deltoid, Pt tolerated well. Returns between December 19, 2022- January 02, 2023.

## 2022-10-18 NOTE — Progress Notes (Signed)
I have personally reviewed the Nurse Visit and agree with the documentation.  Algis Greenhouse. [name], MD 10/18/2022 1:04 PM

## 2022-10-21 ENCOUNTER — Other Ambulatory Visit: Payer: Self-pay | Admitting: Family Medicine

## 2022-10-28 IMAGING — CT CT ABD-PELV W/ CM
1 of 2 series · 15 of 32 positions shown, 19 images · IV contrast (APPLIED)
Comparison: January 01, 2013

CLINICAL DATA: Lower left abdominal pain.

EXAM:
CT ABDOMEN AND PELVIS WITH CONTRAST
TECHNIQUE: Multidetector CT imaging of the abdomen and pelvis was performed
using the standard protocol following bolus administration of
intravenous contrast.
CONTRAST:  100mL OMNIPAQUE IOHEXOL 300 MG/ML  SOLN

[Series 2: abd pel w · axial · 0.82mm/px · z∈[-945,-525]mm · 15 of 93 slices shown, 19 images]
[im 5/93  soft-tissue]
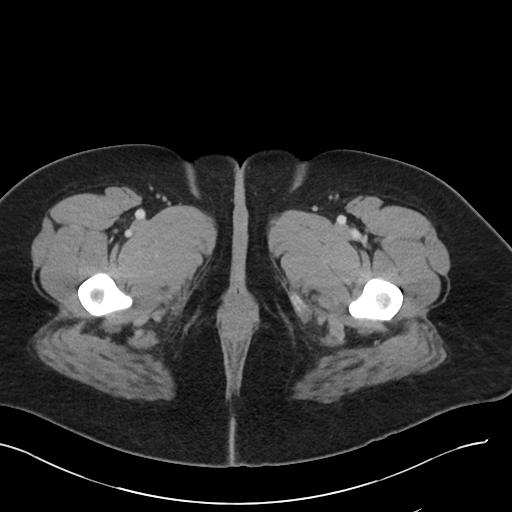
[im 5/93  bone]
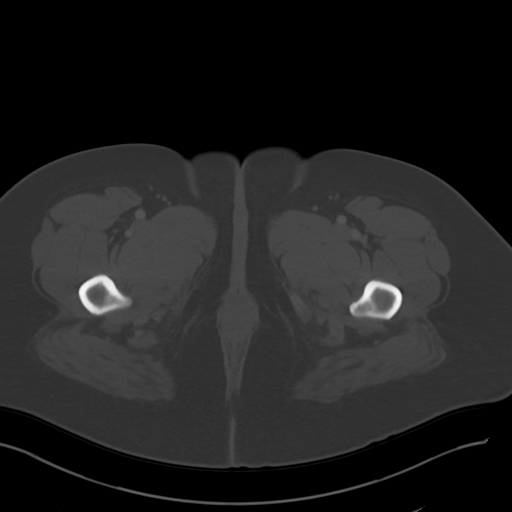
[im 13/93  soft-tissue]
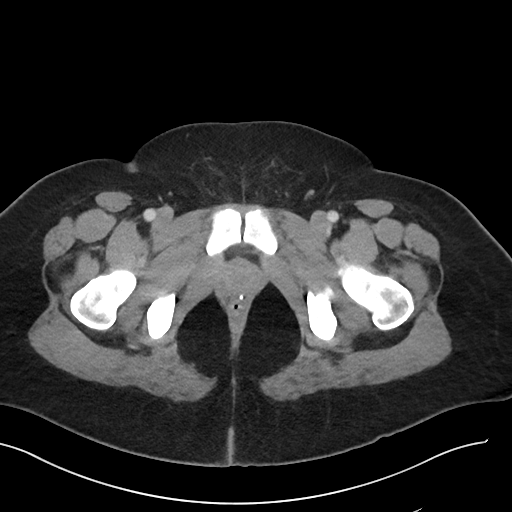
[im 21/93  soft-tissue]
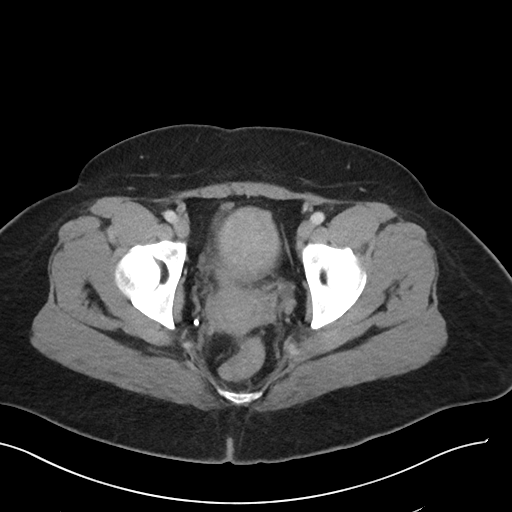
[im 25/93  soft-tissue]
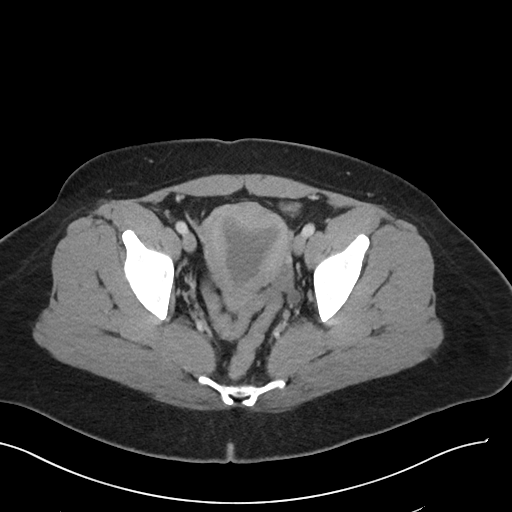
[im 33/93  soft-tissue]
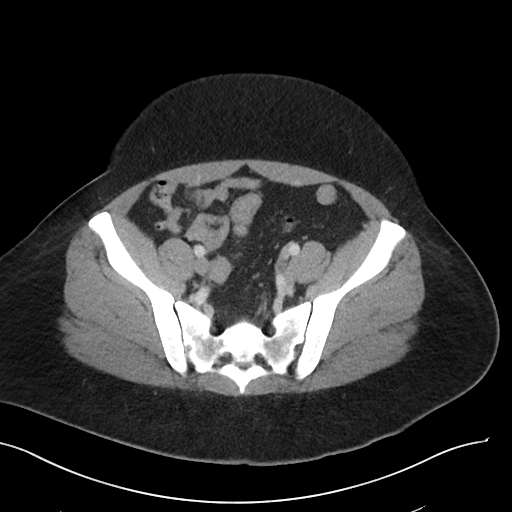
[im 41/93  soft-tissue]
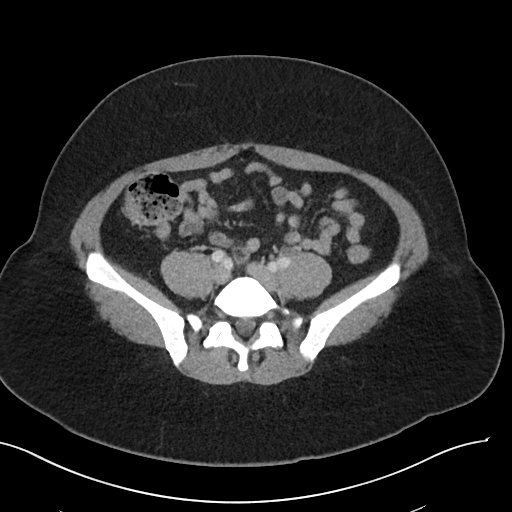
[im 49/93  soft-tissue]
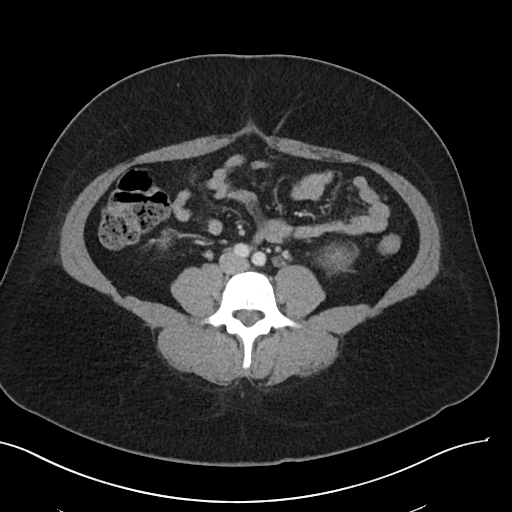
[im 53/93  soft-tissue]
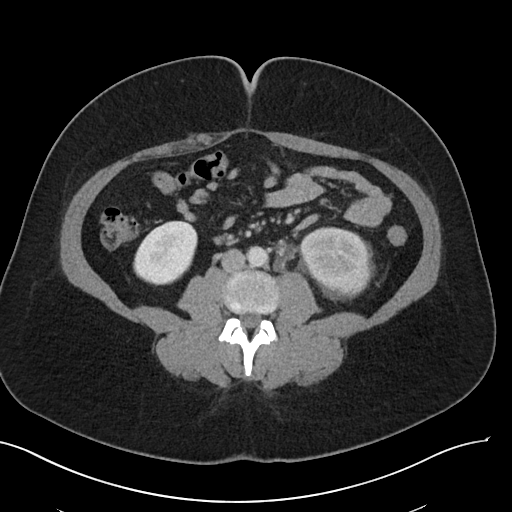
[im 61/93  soft-tissue]
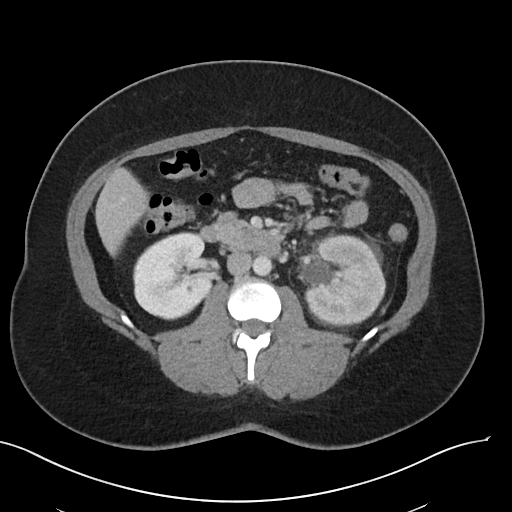
[im 61/93  bone]
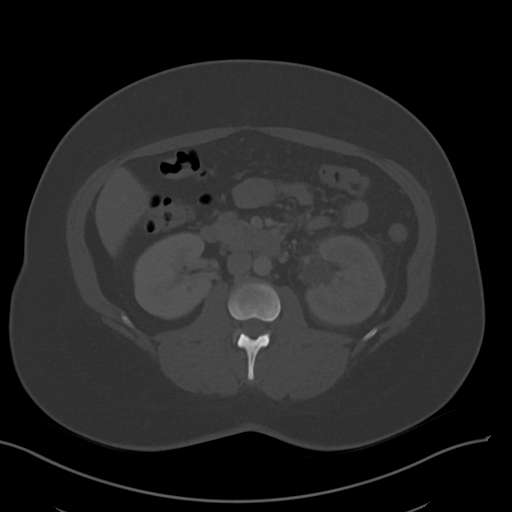
[im 69/93  soft-tissue]
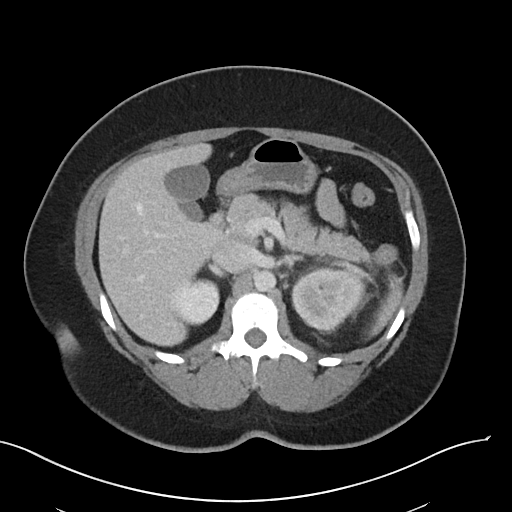
[im 73/93  soft-tissue]
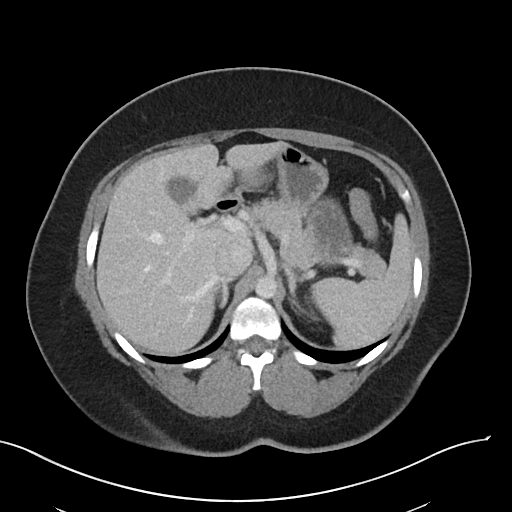
[im 77/93  lung]
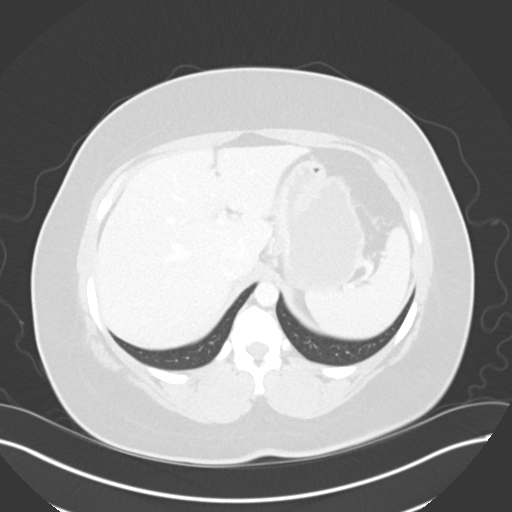
[im 81/93  soft-tissue]
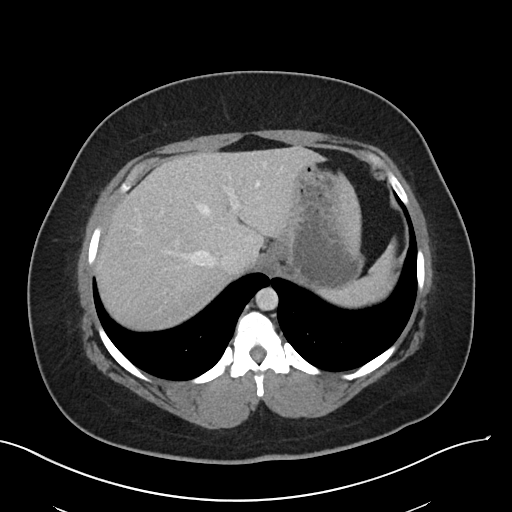
[im 81/93  lung]
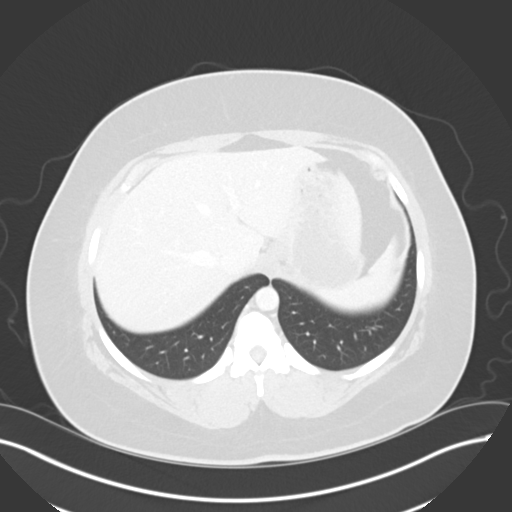
[im 85/93  lung]
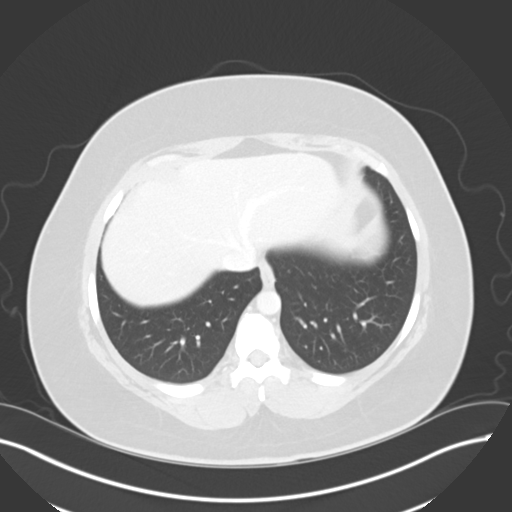
[im 89/93  soft-tissue]
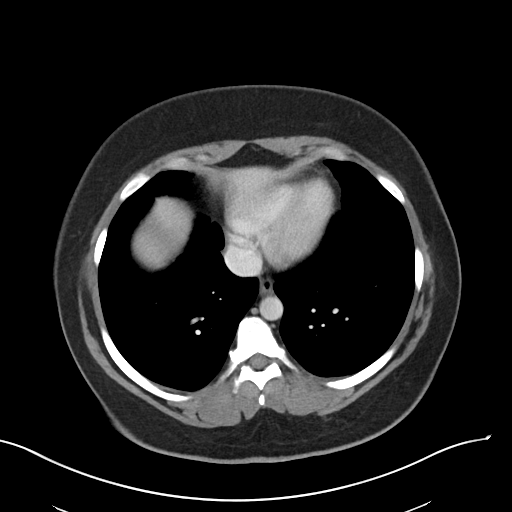
[im 89/93  lung]
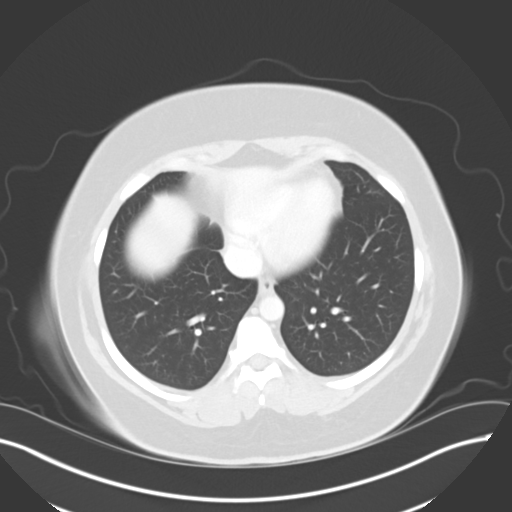

[15 of 32 positions shown; findings below may reference images not displayed]

FINDINGS: Lower chest: No acute abnormality.

Hepatobiliary: A 3 mm focus of parenchymal low attenuation is seen
within the anterior aspect of the right lobe of the liver. This is
too small to characterize by CT exam. No gallstones, gallbladder
wall thickening, or biliary dilatation.

Pancreas: Unremarkable. No pancreatic ductal dilatation or
surrounding inflammatory changes.

Spleen: Normal in size without focal abnormality.

Adrenals/Urinary Tract: Adrenal glands are unremarkable. Kidneys are
normal in size, without focal lesions. A 6 mm obstructing renal
stone is seen at the left UVJ, with mild left-sided hydronephrosis,
hydroureter and perinephric inflammatory fat stranding. Delayed
renal cortical enhancement is also seen on the left. Bladder is
unremarkable.

Stomach/Bowel: Stomach is within normal limits. Appendix appears
normal. No evidence of bowel wall thickening, distention, or
inflammatory changes.

Vascular/Lymphatic: No significant vascular findings are present. No
enlarged abdominal or pelvic lymph nodes.

Reproductive: Uterus and bilateral adnexa are unremarkable.

Other: No abdominal wall hernia or abnormality. No abdominopelvic
ascites.

Musculoskeletal: No acute or significant osseous findings.
IMPRESSION: 6 mm obstructing renal stone at the left UVJ.

## 2022-11-23 ENCOUNTER — Telehealth: Payer: Self-pay | Admitting: *Deleted

## 2022-11-23 NOTE — Telephone Encounter (Signed)
(  Key: [id number]) - [id number] Ozempic (1 MG/DOSE) '4MG'$ Fayne Mediate pen-injectors Status: PA RequestCreated: December 6th, 2023Sent: December 6th, 2023 Waiting for determination

## 2022-12-07 NOTE — Telephone Encounter (Signed)
This medication or product was previously approved on [id number] from 04/06/2022 to 04/07/2023

## 2022-12-23 ENCOUNTER — Ambulatory Visit: Payer: 59 | Admitting: Family Medicine

## 2022-12-29 ENCOUNTER — Ambulatory Visit: Payer: 59 | Admitting: Family Medicine

## 2022-12-29 ENCOUNTER — Encounter: Payer: Self-pay | Admitting: Family Medicine

## 2022-12-29 VITALS — BP 110/78 | HR 77 | Temp 98.0°F | Ht 65.0 in | Wt 240.0 lb

## 2022-12-29 DIAGNOSIS — E1169 Type 2 diabetes mellitus with other specified complication: Secondary | ICD-10-CM | POA: Diagnosis not present

## 2022-12-29 DIAGNOSIS — E1159 Type 2 diabetes mellitus with other circulatory complications: Secondary | ICD-10-CM

## 2022-12-29 DIAGNOSIS — J309 Allergic rhinitis, unspecified: Secondary | ICD-10-CM

## 2022-12-29 DIAGNOSIS — F4329 Adjustment disorder with other symptoms: Secondary | ICD-10-CM

## 2022-12-29 DIAGNOSIS — N939 Abnormal uterine and vaginal bleeding, unspecified: Secondary | ICD-10-CM | POA: Diagnosis not present

## 2022-12-29 DIAGNOSIS — F4320 Adjustment disorder, unspecified: Secondary | ICD-10-CM | POA: Insufficient documentation

## 2022-12-29 DIAGNOSIS — I152 Hypertension secondary to endocrine disorders: Secondary | ICD-10-CM

## 2022-12-29 LAB — POCT GLYCOSYLATED HEMOGLOBIN (HGB A1C): Hemoglobin A1C: 6.3 % — AB (ref 4.0–5.6)

## 2022-12-29 MED ORDER — CITALOPRAM HYDROBROMIDE 20 MG PO TABS: 20.0000 mg | ORAL_TABLET | Freq: Every day | ORAL | 3 refills | Status: DC

## 2022-12-29 MED ORDER — MEDROXYPROGESTERONE ACETATE 150 MG/ML IM SUSP
150.0000 mg | Freq: Once | INTRAMUSCULAR | Status: AC
  Administered 2022-12-29: 150 mg via INTRAMUSCULAR

## 2022-12-29 NOTE — Progress Notes (Signed)
   Peggy Schmidt is a 42 y.o. female who presents today for an office visit.  Assessment/Plan:  Chronic Problems Addressed Today: DM (diabetes mellitus), type 2 (HCC) A1c 6.3.  She is working on diet and exercise.  Does admit to a few dietary discretions.  We did discuss increasing dose of Ozempic however she would like to continue current dose for now.  Will continue Ozempic 1 mg weekly.  She will continue work on lifestyle modifications and we can recheck again in 3 months.  Hypertension associated with diabetes ([place]) Blood pressure at goal today on olmesartan-HCTZ 20-12.5 once daily.   Abnormal uterine bleeding (AUB) Has been following with GYN for this.  We will give her Depo injection today.  Recently had polyp removal though still has issues with ongoing bleeding.  She will be following up with a different gynecologist soon.  Allergic rhinitis Symptoms or not controlled.  She has tried over-the-counter meds without much improvement.  Will place referral to allergy for send per patient request.  Adjustment disorder Patient still with significant mount of grief and depressed mood related to the loss of several close members including her brother, mother and grandmother.  She has been seeing a therapist to work with this which has worked well however they have recommended that she start medication.  She is open to this.  We did discuss treatment options.  We will start Celexa 20 mg daily.  We discussed potential side effects.  She will follow-up in a couple of weeks via MyChart and we can adjust the dose as tolerated. No SI or HI today.      Subjective:  HPI:  See A/P for status of chronic conditions.  Patient is here today for follow-up.  Last seen here 3 months ago for diabetes follow-up.  A1c at that time was 6.4.  We continued her Ozempic 1 mg weekly.  She has done well with this dose.  Does admit to a few dietary indiscretions but overall has been trying to work on her diet and  exercise.  She has been having more issues with allergies. Tried OTC medications without much improvement. She wishes to have allergy testing.   She has ongoing issues with grief related to the loss of several close members. She has been following with her therapist for this.  No reported SI or HI.        Objective:  Physical Exam: BP 110/78   Pulse 77   Temp 98 F (36.7 C) (Temporal)   Ht '5\' 5"'$  (1.651 m)   Wt 240 lb (108.9 kg)   SpO2 97%   BMI 39.94 kg/m   Wt Readings from Last 3 Encounters:  12/29/22 240 lb (108.9 kg)  09/22/22 236 lb 3.2 oz (107.1 kg)  09/22/22 237 lb 3.2 oz (107.6 kg)  Gen: No acute distress, resting comfortably CV: Regular rate and rhythm with no murmurs appreciated Pulm: Normal work of breathing, clear to auscultation bilaterally with no crackles, wheezes, or rhonchi Neuro: Grossly normal, moves all extremities Psych: Normal affect and thought content      Danel Requena M. [name], MD 12/29/2022 9:41 AM

## 2022-12-29 NOTE — Assessment & Plan Note (Signed)
Blood pressure at goal today on olmesartan-HCTZ 20-12.5 once daily.

## 2022-12-29 NOTE — Assessment & Plan Note (Signed)
Has been following with GYN for this.  We will give her Depo injection today.  Recently had polyp removal though still has issues with ongoing bleeding.  She will be following up with a different gynecologist soon.

## 2022-12-29 NOTE — Assessment & Plan Note (Signed)
A1c 6.3.  She is working on diet and exercise.  Does admit to a few dietary discretions.  We did discuss increasing dose of Ozempic however she would like to continue current dose for now.  Will continue Ozempic 1 mg weekly.  She will continue work on lifestyle modifications and we can recheck again in 3 months.

## 2022-12-29 NOTE — Patient Instructions (Signed)
It was very nice to see you today!  Your A1c today is 6.3.  Please continue to work on diet and exercise.  We can continue your current dose of Ozempic.  We will start Celexa.  Please send a message in a few weeks to let me know how this is working.  I will refer you to see an allergist.  Will give you your Depo shot today.  I will see back in 3 months for your next follow-up visit.  Please come back to see [place] sooner if needed.  Take care, Dr [name]  PLEASE NOTE:  If you had any lab tests, please let us know if you have not heard back within a few days. You may see your results on mychart before we have a chance to review them but we will give you a call once they are reviewed by [place].   If we ordered any referrals today, please let us know if you have not heard from their office within the next week.   If you had any urgent prescriptions sent in today, please check with the pharmacy within an hour of our visit to make sure the prescription was transmitted appropriately.   Please try these tips to maintain a healthy lifestyle:  Eat at least 3 REAL meals and 1-2 snacks per day.  Aim for no more than 5 hours between eating.  If you eat breakfast, please do so within one hour of getting up.   Each meal should contain half fruits/vegetables, one quarter protein, and one quarter carbs (no bigger than a computer mouse)  Cut down on sweet beverages. This includes juice, soda, and sweet tea.   Drink at least 1 glass of water with each meal and aim for at least 8 glasses per day  Exercise at least 150 minutes every week.

## 2022-12-29 NOTE — Assessment & Plan Note (Signed)
Symptoms or not controlled.  She has tried over-the-counter meds without much improvement.  Will place referral to allergy for send per patient request.

## 2022-12-29 NOTE — Addendum Note (Signed)
Addended by: Betti Cruz on: 12/29/2022 09:47 AM   Modules accepted: Orders

## 2022-12-29 NOTE — Assessment & Plan Note (Signed)
Patient still with significant mount of grief and depressed mood related to the loss of several close members including her brother, mother and grandmother.  She has been seeing a therapist to work with this which has worked well however they have recommended that she start medication.  She is open to this.  We did discuss treatment options.  We will start Celexa 20 mg daily.  We discussed potential side effects.  She will follow-up in a couple of weeks via MyChart and we can adjust the dose as tolerated. No SI or HI today.

## 2022-12-30 ENCOUNTER — Ambulatory Visit: Payer: 59 | Admitting: Family Medicine

## 2023-01-08 ENCOUNTER — Encounter: Payer: Self-pay | Admitting: Family Medicine

## 2023-01-09 NOTE — Telephone Encounter (Signed)
Please advise 

## 2023-01-09 NOTE — Telephone Encounter (Signed)
Patient requests to be called at ph# [phone] to be advised about Patient states she wants to stop taking Antidepressant (See MyChart message)

## 2023-01-10 NOTE — Telephone Encounter (Signed)
She has not been on them long so it should be easy to come off. Recommend she go to half a pill for a week then stop.  Would like for her to follow up with [place] if she has any issue.  Algis Greenhouse. [name], MD 01/10/2023 8:34 AM

## 2023-01-15 ENCOUNTER — Other Ambulatory Visit: Payer: Self-pay | Admitting: Family Medicine

## 2023-02-08 ENCOUNTER — Ambulatory Visit: Payer: Self-pay | Admitting: Allergy

## 2023-03-03 ENCOUNTER — Encounter: Payer: Self-pay | Admitting: Allergy

## 2023-03-03 ENCOUNTER — Ambulatory Visit: Payer: 59 | Admitting: Allergy

## 2023-03-03 ENCOUNTER — Ambulatory Visit: Payer: 59 | Admitting: Family Medicine

## 2023-03-03 ENCOUNTER — Encounter: Payer: Self-pay | Admitting: Family Medicine

## 2023-03-03 VITALS — BP 115/77 | HR 88 | Temp 97.8°F | Ht 64.5 in | Wt 234.6 lb

## 2023-03-03 VITALS — BP 122/88 | HR 97 | Temp 98.2°F | Resp 18 | Ht 64.5 in | Wt 232.4 lb

## 2023-03-03 DIAGNOSIS — T781XXD Other adverse food reactions, not elsewhere classified, subsequent encounter: Secondary | ICD-10-CM

## 2023-03-03 DIAGNOSIS — E1169 Type 2 diabetes mellitus with other specified complication: Secondary | ICD-10-CM | POA: Diagnosis not present

## 2023-03-03 DIAGNOSIS — E1159 Type 2 diabetes mellitus with other circulatory complications: Secondary | ICD-10-CM | POA: Diagnosis not present

## 2023-03-03 DIAGNOSIS — N939 Abnormal uterine and vaginal bleeding, unspecified: Secondary | ICD-10-CM

## 2023-03-03 DIAGNOSIS — J310 Chronic rhinitis: Secondary | ICD-10-CM

## 2023-03-03 DIAGNOSIS — L509 Urticaria, unspecified: Secondary | ICD-10-CM | POA: Diagnosis not present

## 2023-03-03 DIAGNOSIS — I152 Hypertension secondary to endocrine disorders: Secondary | ICD-10-CM | POA: Diagnosis not present

## 2023-03-03 DIAGNOSIS — H109 Unspecified conjunctivitis: Secondary | ICD-10-CM

## 2023-03-03 MED ORDER — RYALTRIS 665-25 MCG/ACT NA SUSP: 2.0000 | Freq: Two times a day (BID) | NASAL | 5 refills | Status: DC | PRN

## 2023-03-03 MED ORDER — OZEMPIC (2 MG/DOSE) 8 MG/3ML ~~LOC~~ SOPN: 2.0000 mg | PEN_INJECTOR | SUBCUTANEOUS | 5 refills | Status: DC

## 2023-03-03 MED ORDER — NAPROXEN 500 MG PO TABS: 500.0000 mg | ORAL_TABLET | Freq: Two times a day (BID) | ORAL | 0 refills | Status: DC
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
## 2023-03-03 NOTE — Addendum Note (Signed)
Addended by: Isabel Caprice on: 03/03/2023 11:08 AM   Modules accepted: Orders

## 2023-03-03 NOTE — Assessment & Plan Note (Signed)
Blood pressure well-controlled today.  She like to wean off blood pressure meds.  She will go to half a tablet of her current olmesartan-HCTZ 20-12.5 once daily.  She can follow-up with me in a few weeks via MyChart.

## 2023-03-03 NOTE — Assessment & Plan Note (Signed)
Last A1c.   6.3. It is too early to recheck today.  She is interested in increasing dose of Ozempic.  She is currently on 1 mg weekly and doing well.  Will increase to 2 mg weekly.  She will continue to work on lifestyle modifications.  She will come back in several weeks and we can recheck A1c at that time.

## 2023-03-03 NOTE — Patient Instructions (Signed)
It was very nice to see you today!  You probably sprained your ankle.  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  Please make sure that you have good inserts in your shoes.  Take the naproxen 500 mg twice daily for the next week or so.  Do not take ibuprofen while on this.  Work on the exercises.  Use ice as needed.  Let us know if not improving.  We will increase her dose of Ozempic to 2 mg weekly.  It is okay for you to cut your blood pressure pill in half.  We will see you back in a few weeks after your surgery.  Please come back to see [place] sooner if needed.  Take care, Dr [name]  PLEASE NOTE:  If you had any lab tests, please let us know if you have not heard back within a few days. You may see your results on mychart before we have a chance to review them but we will give you a call once they are reviewed by [place].   If we ordered any referrals today, please let us know if you have not heard from their office within the next week.   If you had any urgent prescriptions sent in today, please check with the pharmacy within an hour of our visit to make sure the prescription was transmitted appropriately.   Please try these tips to maintain a healthy lifestyle:  Eat at least 3 REAL meals and 1-2 snacks per day.  Aim for no more than 5 hours between eating.  If you eat breakfast, please do so within one hour of getting up.   Each meal should contain half fruits/vegetables, one quarter protein, and one quarter carbs (no bigger than a computer mouse)  Cut down on sweet beverages. This includes juice, soda, and sweet tea.   Drink at least 1 glass of water with each meal and aim for at least 8 glasses per day  Exercise at least 150 minutes every week.

## 2023-03-03 NOTE — Progress Notes (Signed)
Peggy Schmidt is a 42 y.o. female who presents today for an office visit.  Assessment/Plan:  New/Acute Problems: Left Foot Pain Possibly mild ankle sprain based on exam though her mechanism of injury is slightly atypical.  No other obvious abnormalities on exam.  It is reassuring that she has had some improvement to last several days.  We will start naproxen 500 mg twice daily for the next week or so.  She is aware to not take any other NSAIDs while on this.  We discussed home exercises and handout was given.  She can use ice to the area as needed as well.  She will let me know if not improving in the next couple of weeks and we can refer to sports medicine  Right foot pain Consistent with plantars fasciitis.  Will be starting naproxen as above.  Hopefully this will help some with her plantar fasciitis.  We also discussed home exercise program and handout was given.  We also discussed importance of good arch support.  She will start wearing inserts to help provide this.  We discussed reasons to return to care.  She will let us know if not improving in the next couple of weeks and we can refer to sports medicine as above.  Chronic Problems Addressed Today: Hypertension associated with diabetes ([place]) Blood pressure well-controlled today.  She like to wean off blood pressure meds.  She will go to half a tablet of her current olmesartan-HCTZ 20-12.5 once daily.  She can follow-up with me in a few weeks via MyChart.  DM (diabetes mellitus), type 2 (HCC) Last A1c.   6.3. It is too early to recheck today.  She is interested in increasing dose of Ozempic.  She is currently on 1 mg weekly and doing well.  Will increase to 2 mg weekly.  She will continue to work on lifestyle modifications.  She will come back in several weeks and we can recheck A1c at that time.  Abnormal uterine bleeding (AUB) Following with GYN.  She has upcoming polyp removal and endometrial ablation.  Deferred repeat Depo-Provera  shot.     Subjective:  HPI:  Patient here with left foot pain. Started about 3 weeks ago after stretching her foot. She felt a pop and noticed some soreness. Felt a pop on the middle aspect of her foot. Went to her provider at work and was given a rigid sole shoe. She does of walking with work. Getting better. Tried ice. Tried ibuprofen with some improvement.   She has also had some pain in her right foot pain.  This is been going on for close to a year.  Located on bottom of her foot.  See A/p for status of chronic conditions.        Objective:  Physical Exam: BP 115/77   Pulse 88   Temp 97.8 F (36.6 C) (Temporal)   Ht 5' 4.5" (1.638 m)   Wt 234 lb 9.6 oz (106.4 kg)   SpO2 98%   BMI 39.65 kg/m  Wt Readings from Last 3 Encounters:  03/03/23 234 lb 9.6 oz (106.4 kg)  03/03/23 232 lb 6.4 oz (105.4 kg)  12/29/22 240 lb (108.9 kg)  Gen: No acute distress, resting comfortably MSK: - LEft Foot: No deformities.  Tenderness palpation along lateral midfoot along base of fifth metatarsal and inferior lateral malleolus.  Pain elicited with inversion. - Right Foot: Tenderness palpation along distal calcaneus. Neuro: Grossly normal, moves all extremities Psych: Normal affect and  thought [place]. [name], MD 03/03/2023 1:27 PM

## 2023-03-03 NOTE — Assessment & Plan Note (Addendum)
Following with GYN.  She has upcoming polyp removal and endometrial ablation.  Deferred repeat Depo-Provera shot.

## 2023-03-03 NOTE — Patient Instructions (Addendum)
-   Schedule skin testing visit. Hold all antihistamines (this includes Xyzal and Benadryl) for 3 days prior to this visit.  - Start Xyzal 5mg  daily.  This is a long-acting antihistamine. Let us know if this helps or not.  Sample provided.  - Start Ryaltris nasal spray 2 spray each nostril twice a day.  This is a combination spray that helps with nasal congestion (mometasone) and nasal drainage (olopatadine).  Sample provided.   - Start Pataday 1 drop each eye daily as needed for itchy/watery eyes - Consider Singulair daily tablet if antihistamine not effective.  Singulair is an antileukotriene that can help with allergy symptom control.   - If allergy testing is positive then would recommend allergy shot course.  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  These hives occur with body temperature changes typically when there is increase in body temperature like being out in the sun - Take Xyzal as above and if effective may need twice a day dosing.  Can also add in Pepcid twice a day dosing if needed for hive control.    - Will skin test to select tree nuts and pineapple.  Continue to avoid.    Follow-up for skin testing

## 2023-03-08 ENCOUNTER — Telehealth: Payer: Self-pay | Admitting: *Deleted

## 2023-03-08 NOTE — Telephone Encounter (Signed)
(  Key: [id number]) [id number] Ozempic (2 MG/DOSE) 8MG F[place]ne Mediate pen-injectors Sent: March 20th, 2024 Waiting for determination

## 2023-03-08 NOTE — Telephone Encounter (Signed)
Message from Plan Request Reference Number: [id number]. OZEMPIC INJ 8MG /3ML is approved through 03/07/2024. Your patient may now fill this prescription and it will be covered.. Authorization Expiration Date: March 07, 2024. Pharmacy notified /patient notified

## 2023-03-10 ENCOUNTER — Encounter: Payer: Self-pay | Admitting: Allergy

## 2023-03-10 ENCOUNTER — Ambulatory Visit: Payer: 59 | Admitting: Allergy

## 2023-03-10 ENCOUNTER — Other Ambulatory Visit: Payer: Self-pay

## 2023-03-10 VITALS — BP 130/96 | HR 82 | Temp 98.3°F | Resp 16 | Ht 65.0 in | Wt 235.4 lb

## 2023-03-10 DIAGNOSIS — T781XXA Other adverse food reactions, not elsewhere classified, initial encounter: Secondary | ICD-10-CM | POA: Diagnosis not present

## 2023-03-10 DIAGNOSIS — J3089 Other allergic rhinitis: Secondary | ICD-10-CM | POA: Diagnosis not present

## 2023-03-10 DIAGNOSIS — T781XXD Other adverse food reactions, not elsewhere classified, subsequent encounter: Secondary | ICD-10-CM

## 2023-03-10 DIAGNOSIS — L509 Urticaria, unspecified: Secondary | ICD-10-CM | POA: Diagnosis not present

## 2023-03-10 DIAGNOSIS — H1013 Acute atopic conjunctivitis, bilateral: Secondary | ICD-10-CM

## 2023-03-10 MED ORDER — FAMOTIDINE 20 MG PO TABS: ORAL_TABLET | ORAL | 5 refills | Status: DC

## 2023-03-10 MED ORDER — RYALTRIS 665-25 MCG/ACT NA SUSP: 2.0000 | Freq: Two times a day (BID) | NASAL | 5 refills | Status: DC | PRN

## 2023-03-10 MED ORDER — LEVOCETIRIZINE DIHYDROCHLORIDE 5 MG PO TABS: 5.0000 mg | ORAL_TABLET | Freq: Two times a day (BID) | ORAL | 5 refills | Status: DC | PRN

## 2023-03-10 MED ORDER — OLOPATADINE HCL 0.2 % OP SOLN: OPHTHALMIC | 5 refills | Status: DC

## 2023-03-10 NOTE — Patient Instructions (Addendum)
-   Testing today showed: trees, indoor molds, outdoor molds, and dog. - Copy of test results provided.  - Avoidance measures provided. - Continue Xyzal 5mg  daily.  This is a long-acting antihistamine - Continue Ryaltris nasal spray 2 spray each nostril twice a day.  This is a combination spray that helps with nasal congestion (mometasone) and nasal drainage (olopatadine).   - Continue Pataday 1 drop each eye daily as needed for itchy/watery eyes - Consider Singulair daily tablet if antihistamine not effective.  Singulair is an antileukotriene that can help with allergy symptom control.   - Consider allergy shots as a means of long-term control. - Allergy shots "re-train" and "reset" the immune system to ignore environmental allergens and decrease the resulting immune response to those allergens (sneezing, itchy watery eyes, runny nose, nasal congestion, etc).    - Allergy shots improve symptoms in 75-85% of patients.   - Cholinergic urticaria (hives).  These hives occur with body temperature changes typically when there is increase in body temperature like being out in the sun - Take Xyzal as above and if effective may need twice a day dosing.  Can also add in Pepcid twice a day dosing if needed for hive control.    - Select food allergy skin testing is negative to pineapple and select tree nuts.   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  - The oral allergy syndrome (OAS) or pollen-food allergy syndrome (PFAS) is a relatively common form of food allergy, particularly in adults. It typically occurs in people who have pollen allergies when the immune system "sees" proteins on the food that look like proteins on the pollen. This results in the allergy antibody (IgE) binding to the food instead of the pollen. Patients typically report itching and/or mild swelling of the mouth and throat immediately following ingestion of certain uncooked fruits (including nuts) or raw vegetables. Only a  very small number of affected individuals experience systemic allergic reactions, such as anaphylaxis which occurs with true food allergies.        Follow-up in 4-6 months or sooner if needed

## 2023-03-10 NOTE — Progress Notes (Signed)
Follow-up Note  RE: Peggy Schmidt MRN: [id number] DOB: Dec 18, 1981 Date of Office Visit: 03/10/2023   History of present illness: Peggy Schmidt is a 42 y.o. female presenting today for skin testing visit.  She was last seen in the office on 03/03/23 for her initial visit for rhinoconjunctivitis.  She has held antihistamines for at least days for testing today.  She has not had any illnesses in the past week.   Medication List: Current Outpatient Medications  Medication Sig Dispense Refill   cyclobenzaprine (FLEXERIL) 10 MG tablet Take 1 tablet (10 mg total) by mouth 3 (three) times daily as needed for muscle spasms. 30 tablet 0   famotidine (PEPCID) 20 MG tablet 1 tablet 2 times daily as needed for hives. 60 tablet 5   levocetirizine (XYZAL) 5 MG tablet Take 1 tablet (5 mg total) by mouth 2 (two) times daily as needed for allergies. 60 tablet 5   medroxyPROGESTERone Acetate (DEPO-PROVERA IM) Inject into the muscle every 3 (three) months.     naproxen (NAPROSYN) 500 MG tablet Take 1 tablet (500 mg total) by mouth 2 (two) times daily with a meal. 30 tablet 0   olmesartan-hydrochlorothiazide (BENICAR HCT) 20-12.5 MG tablet TAKE 1 TABLET BY MOUTH DAILY 30 tablet 1   Olopatadine HCl 0.2 % SOLN 1 drop each eye daily as needed for itchy/watery eyes. 2.5 mL 5   Semaglutide, 2 MG/DOSE, (OZEMPIC, 2 MG/DOSE,) 8 MG/3ML SOPN Inject 2 mg into the skin once a week. 3 mL 5   TRETINOIN EX Apply topically every other day.     ibuprofen (ADVIL) 800 MG tablet Take 1 tablet (800 mg total) by mouth every 8 (eight) hours as needed for moderate pain, mild pain or cramping. (Patient not taking: Reported on 03/10/2023) 30 tablet 2   RYALTRIS 665-25 MCG/ACT SUSP Place 2 sprays into the nose 2 (two) times daily as needed (runny and stuffy nose). 29 g 5   No current facility-administered medications for this visit.     Known medication allergies: Allergies  Allergen Reactions   Psychologist, clinical Other (See  Comments)   Other Itching and Swelling    Black walnuts--  itchy mouth and tongue swelling      Physical examination (limited): Blood pressure (!) 130/96, pulse 82, temperature 98.3 F (36.8 C), resp. rate 16, height 5\' 5"  (1.651 m), weight 235 lb 6.4 oz (106.8 kg), SpO2 97 %.  General: Alert, interactive, in no acute distress. Skin: Warm and dry, without lesions or rashes.   Diagnositics/Labs:  Allergy testing:   Airborne Adult Perc - 03/10/23 0800     Time Antigen Placed [id number]    Allergen Manufacturer Lavella Hammock    Location Back    Number of Test 59    1. Control-Buffer 50% Glycerol Negative    2. Control-Histamine 1 mg/ml 2+    3. Albumin saline Negative    4. McLaughlin Negative    5. [place] Negative    6. Johnson Negative    7. [place] Blue Negative    8. Meadow Fescue Negative    9. Perennial Rye Negative    10. Sweet Vernal Negative    11. Timothy Negative    12. Cocklebur Negative    13. Burweed Marshelder Negative    14. Ragweed, short Negative    15. Ragweed, Giant Negative    16. Plantain,  English Negative    17. Lamb's Quarters Negative    18. Sheep Sorrell Negative  19. Rough Pigweed Negative    20. Marsh Elder, Rough Negative    21. Mugwort, Common Negative    22. Ash mix Negative    23. Birch mix Negative    24. Beech American Negative    25. Box, Elder 2+    26. Cedar, red 2+    27. Cottonwood, [place] Negative    28. Elm mix Negative    29. Hickory Negative    30. Maple mix Negative    31. Oak, [place] mix Negative    32. Pecan Pollen Negative    33. Pine mix Negative    34. Sycamore Eastern Negative    35. Hammond, Black Pollen Negative    36. Alternaria alternata Negative    37. Cladosporium Herbarum Negative    38. Aspergillus mix Negative    39. Penicillium mix Negative    40. Bipolaris sorokiniana (Helminthosporium) Negative    41. Drechslera spicifera (Curvularia) Negative    42. Mucor plumbeus Negative    43. Fusarium moniliforme 2+     44. Aureobasidium pullulans (pullulara) Negative    45. Rhizopus oryzae Negative    46. Botrytis cinera Negative    47. Epicoccum nigrum Negative    48. Phoma betae Negative    49. Candida Albicans Negative    50. Trichophyton mentagrophytes Negative    51. Mite, D Farinae  5,000 AU/ml Negative    52. Mite, D Pteronyssinus  5,000 AU/ml Negative    53. Cat Hair 10,000 BAU/ml Negative    54.  Dog Epithelia Negative    55. Mixed Feathers Negative    56. Horse Epithelia Negative    57. Cockroach, German Negative    58. Mouse Negative    59. Tobacco Leaf Negative             Intradermal - 03/10/23 0900     Time Antigen Placed [id number]    Allergen Manufacturer Lavella Hammock    Location Arm    Number of Test 13    Control Negative    Gu[place] Negative    Johnson Negative    7 Grass Negative    Ragweed mix Negative    Weed mix Negative    Mold 1 2+    Mold 2 2+    Mold 3 2+    Cat Negative    Dog 2+    Cockroach Negative    Mite mix Negative             Food Adult Perc - 03/10/23 0800     Time Antigen Placed [id number]    Allergen Manufacturer Lavella Hammock    Location Back    Number of allergen test 5    11. Pecan Food Negative    12. [place] Negative    13. Almond Negative    15. [place] nut Negative    63. Pineapple Negative             Allergy testing results were read and interpreted by provider, documented by clinical staff.   Assessment and plan: Allergic rhinitis with conjunctivitis  - Testing today showed: trees, indoor molds, outdoor molds, and dog. - Copy of test results provided.  - Avoidance measures provided. - Continue Xyzal 5mg  daily.  This is a long-acting antihistamine - Continue Ryaltris nasal spray 2 spray each nostril twice a day.  This is a combination spray that helps with nasal congestion (mometasone) and nasal drainage (olopatadine).   - Continue Pataday 1 drop each eye daily as needed  for itchy/watery eyes - Consider Singulair daily tablet if  antihistamine not effective.  Singulair is an antileukotriene that can help with allergy symptom control.   - Consider allergy shots as a means of long-term control. - Allergy shots "re-train" and "reset" the immune system to ignore environmental allergens and decrease the resulting immune response to those allergens (sneezing, itchy watery eyes, runny nose, nasal congestion, etc).    - Allergy shots improve symptoms in 75-85% of patients.   Urticaria - Cholinergic urticaria (hives).  These hives occur with body temperature changes typically when there is increase in body temperature like being out in the sun - Take Xyzal as above and if effective may need twice a day dosing.  Can also add in Pepcid twice a day dosing if needed for hive control.    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  - The oral allergy syndrome (OAS) or pollen-food allergy syndrome (PFAS) is a relatively common form of food allergy, particularly in adults. It typically occurs in people who have pollen allergies when the immune system "sees" proteins on the food that look like proteins on the pollen. This results in the allergy antibody (IgE) binding to the food instead of the pollen. Patients typically report itching and/or mild swelling of the mouth and throat immediately following ingestion of certain uncooked fruits (including nuts) or raw vegetables. Only a very small number of affected individuals experience systemic allergic reactions, such as anaphylaxis which occurs with true food allergies.    Follow-up fin 4-6 months or sooner if needed  I appreciate the opportunity to take part in [place] care. Please do not hesitate to contact me with questions.  Sincerely,   Prudy Feeler, MD Allergy/Immunology [facility]

## 2023-03-24 ENCOUNTER — Ambulatory Visit: Payer: 59 | Admitting: Family Medicine

## 2023-04-06 ENCOUNTER — Ambulatory Visit: Payer: 59 | Admitting: Family Medicine

## 2023-04-06 ENCOUNTER — Encounter: Payer: Self-pay | Admitting: Neurology

## 2023-04-06 ENCOUNTER — Encounter: Payer: Self-pay | Admitting: Family Medicine

## 2023-04-06 VITALS — BP 123/78 | HR 105 | Temp 98.0°F | Resp 16 | Ht 65.0 in | Wt 234.6 lb

## 2023-04-06 DIAGNOSIS — E1159 Type 2 diabetes mellitus with other circulatory complications: Secondary | ICD-10-CM

## 2023-04-06 DIAGNOSIS — Z309 Encounter for contraceptive management, unspecified: Secondary | ICD-10-CM

## 2023-04-06 DIAGNOSIS — E1169 Type 2 diabetes mellitus with other specified complication: Secondary | ICD-10-CM

## 2023-04-06 DIAGNOSIS — Z3042 Encounter for surveillance of injectable contraceptive: Secondary | ICD-10-CM

## 2023-04-06 DIAGNOSIS — I152 Hypertension secondary to endocrine disorders: Secondary | ICD-10-CM

## 2023-04-06 DIAGNOSIS — Z7985 Long-term (current) use of injectable non-insulin antidiabetic drugs: Secondary | ICD-10-CM

## 2023-04-06 DIAGNOSIS — R519 Headache, unspecified: Secondary | ICD-10-CM | POA: Diagnosis not present

## 2023-04-06 DIAGNOSIS — N939 Abnormal uterine and vaginal bleeding, unspecified: Secondary | ICD-10-CM

## 2023-04-06 DIAGNOSIS — F4329 Adjustment disorder with other symptoms: Secondary | ICD-10-CM

## 2023-04-06 LAB — POCT GLYCOSYLATED HEMOGLOBIN (HGB A1C): Hemoglobin A1C: 5.9 % — AB (ref 4.0–5.6)

## 2023-04-06 MED ORDER — MEDROXYPROGESTERONE ACETATE 150 MG/ML IM SUSP
150.0000 mg | Freq: Once | INTRAMUSCULAR | Status: AC
  Administered 2023-04-06: 150 mg via INTRAMUSCULAR

## 2023-04-06 MED ORDER — AMITRIPTYLINE HCL 25 MG PO TABS: 25.0000 mg | ORAL_TABLET | Freq: Every day | ORAL | 0 refills | Status: DC

## 2023-04-06 NOTE — Assessment & Plan Note (Signed)
Overall mood seems to be stable though does note that she has had a little more racing thoughts the last few weeks.  We have previously tried Celexa 20 mg daily a few months ago however she did not tolerate.  Will be starting low-dose amitriptyline as above for her headache disorder which should help some with her mood as well.  She will follow-up with me in a couple of weeks via MyChart.  She has been seeing a therapist at work and has 1 session left with them.

## 2023-04-06 NOTE — Assessment & Plan Note (Signed)
Blood pressure at goal today on olmesartan-HCTZ 20-12.5 half tablet daily.

## 2023-04-06 NOTE — Patient Instructions (Addendum)
It was very nice to see you today!  Your A1c today is 5.9.  Please continue your current dose of Ozempic.  Please try the amitriptyline to help with your sleep and headaches.  I will refer you to see the neurologist as well.  Please send me a message in a few weeks to let me know how this is working for you.  We will give you your Depo shot today.  Return in about 6 months (around 10/06/2023).   Take care, Dr Jimmey Ralph  PLEASE NOTE:  If you had any lab tests, please let us know if you have not heard back within a few days. You may see your results on mychart before we have a chance to review them but we will give you a call once they are reviewed by [place].   If we ordered any referrals today, please let us know if you have not heard from their office within the next week.   If you had any urgent prescriptions sent in today, please check with the pharmacy within an hour of our visit to make sure the prescription was transmitted appropriately.   Please try these tips to maintain a healthy lifestyle:  Eat at least 3 REAL meals and 1-2 snacks per day.  Aim for no more than 5 hours between eating.  If you eat breakfast, please do so within one hour of getting up.   Each meal should contain half fruits/vegetables, one quarter protein, and one quarter carbs (no bigger than a computer mouse)  Cut down on sweet beverages. This includes juice, soda, and sweet tea.   Drink at least 1 glass of water with each meal and aim for at least 8 glasses per day  Exercise at least 150 minutes every week.

## 2023-04-06 NOTE — Assessment & Plan Note (Signed)
No red flags.  This is a chronic issue for her that seems to be worsening.  Recently.  Will place referral to neurology per patient request.  We did refer her a couple of years ago but she was never able to follow-up with them.  We also discussed preventative medications.  Will start low-dose amitriptyline 25 mg nightly.  Discussed potential side effects.  She can follow-up with me in a couple of weeks via MyChart.

## 2023-04-06 NOTE — Assessment & Plan Note (Signed)
A1c 5.9.  Congratulated patient on weight loss and reduction in A1c.  She is doing well with current dose of Ozempic 2 mg weekly.  She will come back in 3 to 6 months and we will recheck A1c at that time.

## 2023-04-06 NOTE — Assessment & Plan Note (Addendum)
Follows with GYN.  Has upcoming endometrial ablation in a couple of months.  Will give her routine Depo Provera injection today.
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
## 2023-04-21 ENCOUNTER — Ambulatory Visit: Payer: 59 | Admitting: Family Medicine

## 2023-04-26 ENCOUNTER — Other Ambulatory Visit: Payer: Self-pay | Admitting: Family Medicine

## 2023-04-26 IMAGING — US US ABDOMEN LIMITED
1 series · 14 of 25 positions shown · non-contrast
Comparison: None.

CLINICAL DATA: Upper abdominal pain

EXAM:
ULTRASOUND ABDOMEN LIMITED RIGHT UPPER QUADRANT

[Series 1: us abdomen limited ruq (liver/gb) · 14 of 73 slices shown]
[im 1/73]
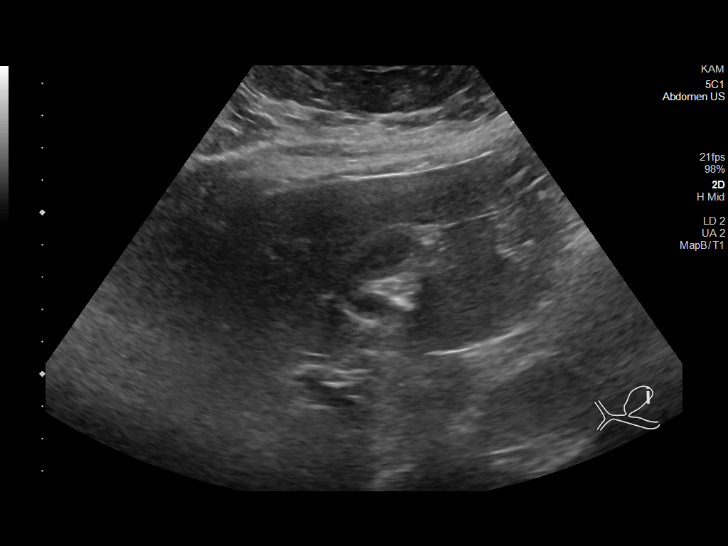
[im 7/73]
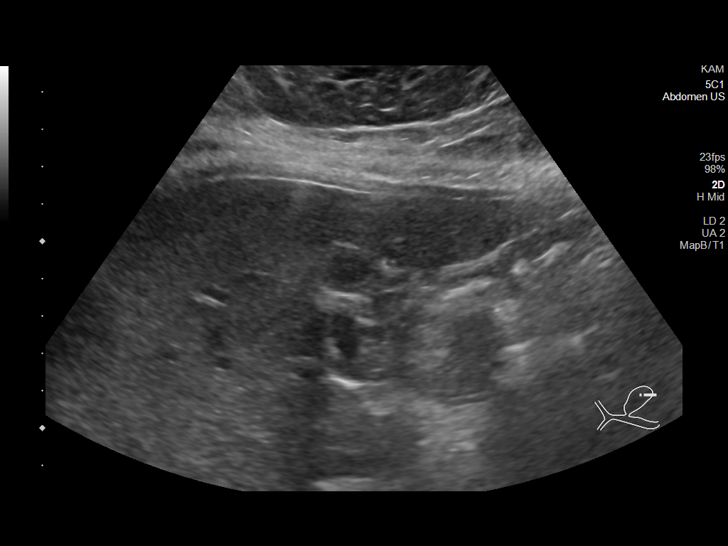
[im 13/73]
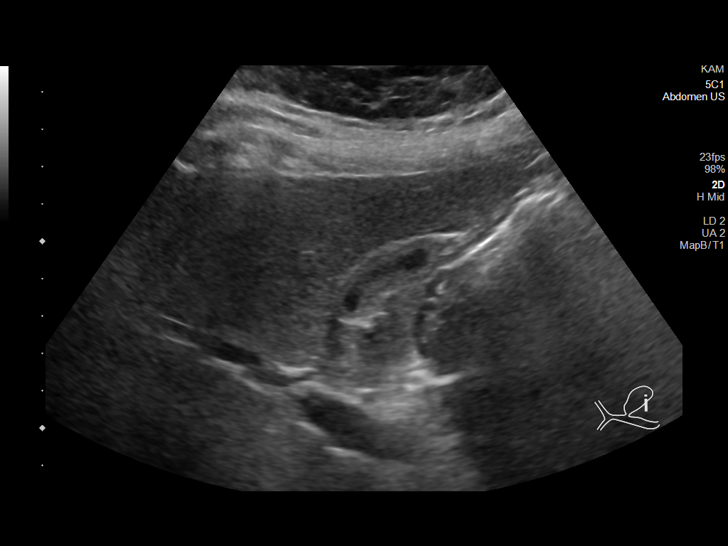
[im 19/73]
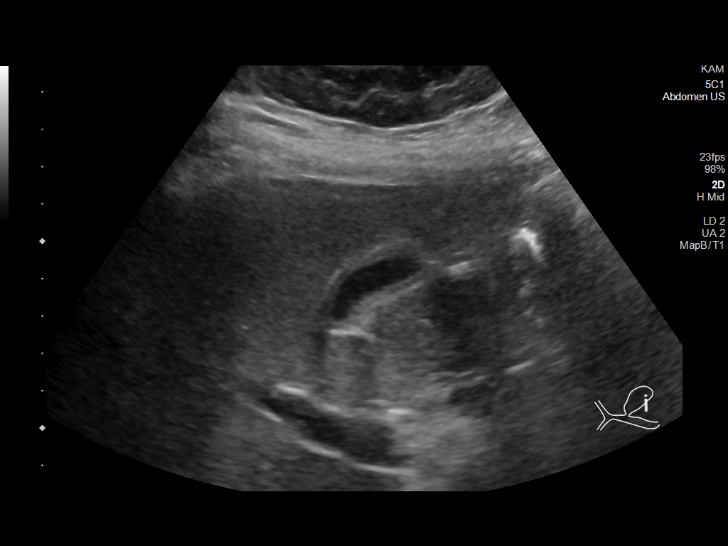
[im 25/73]
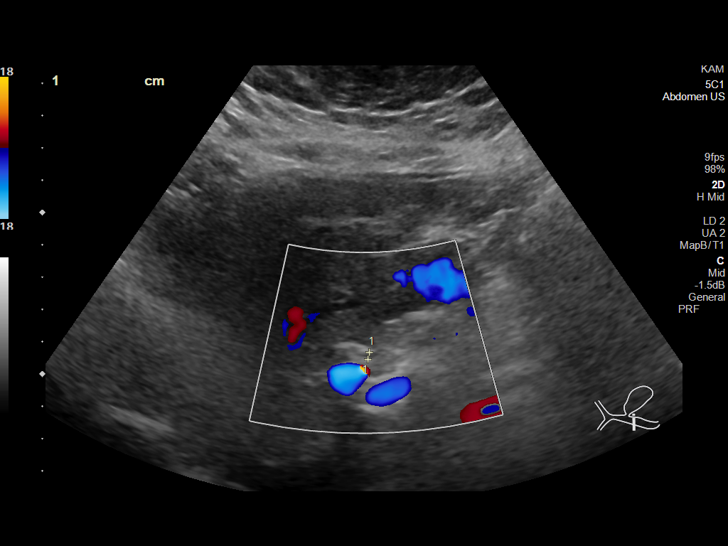
[im 28/73]
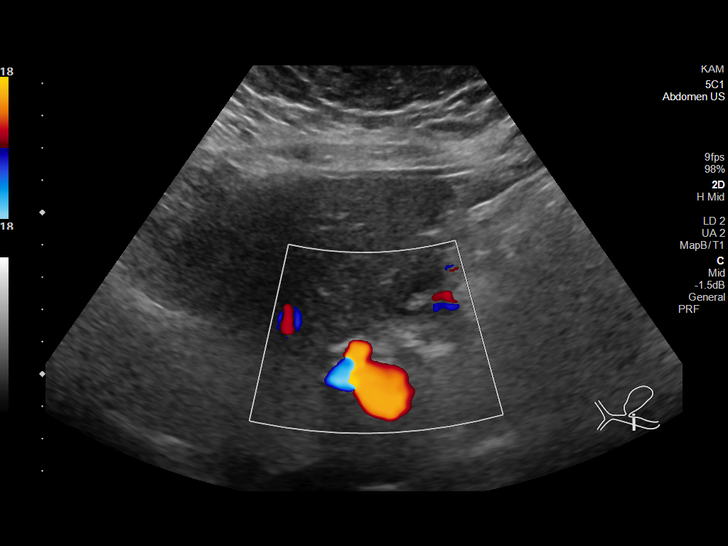
[im 34/73]
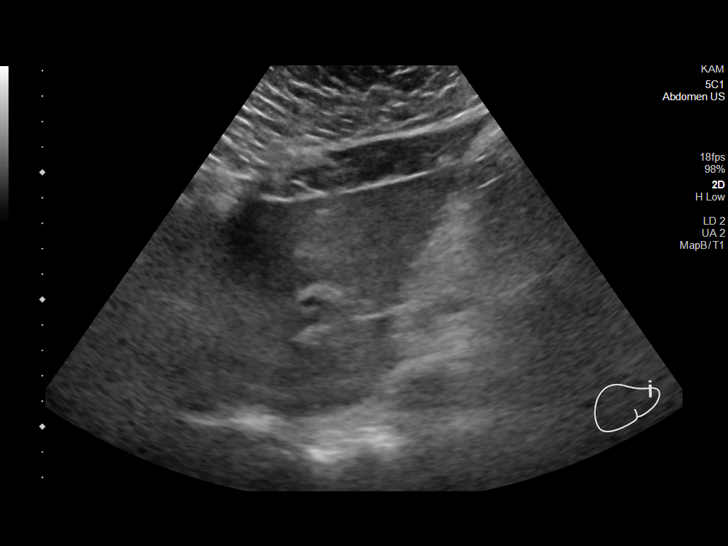
[im 40/73]
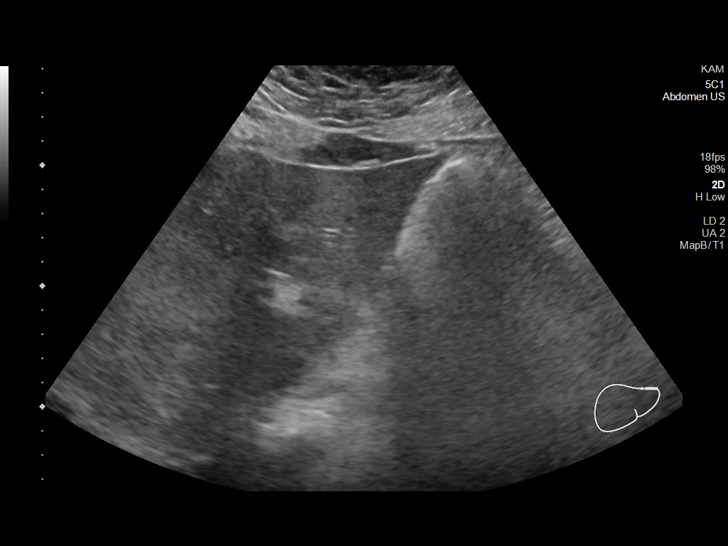
[im 46/73]
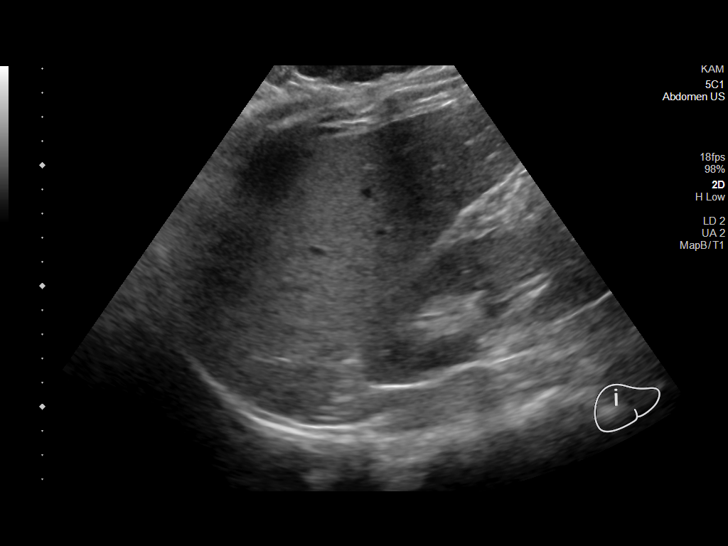
[im 49/73]
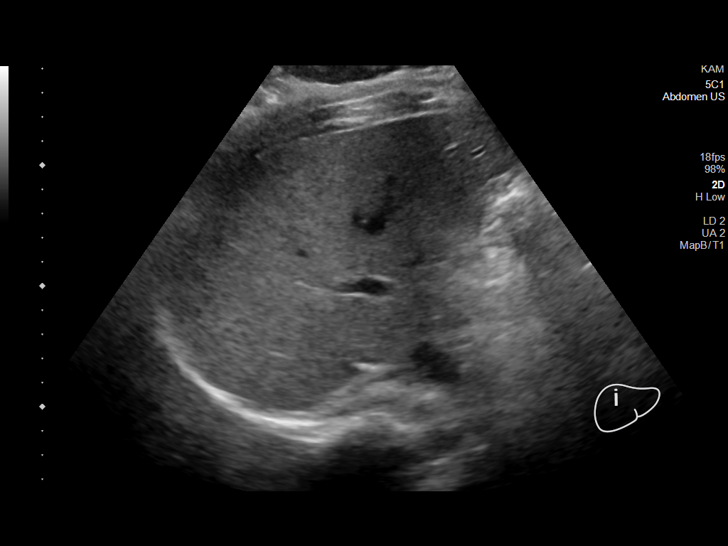
[im 55/73]
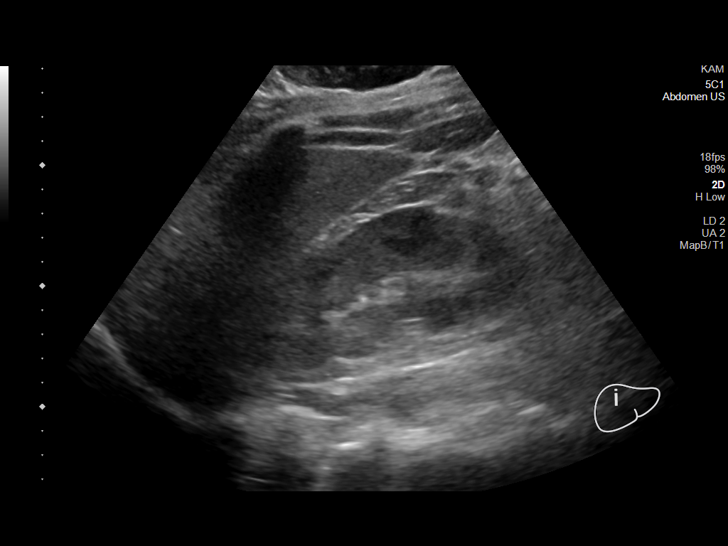
[im 61/73]
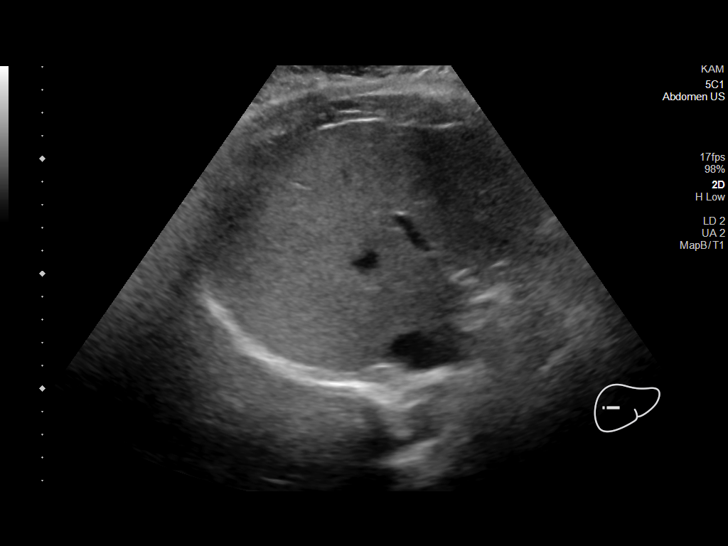
[im 67/73]
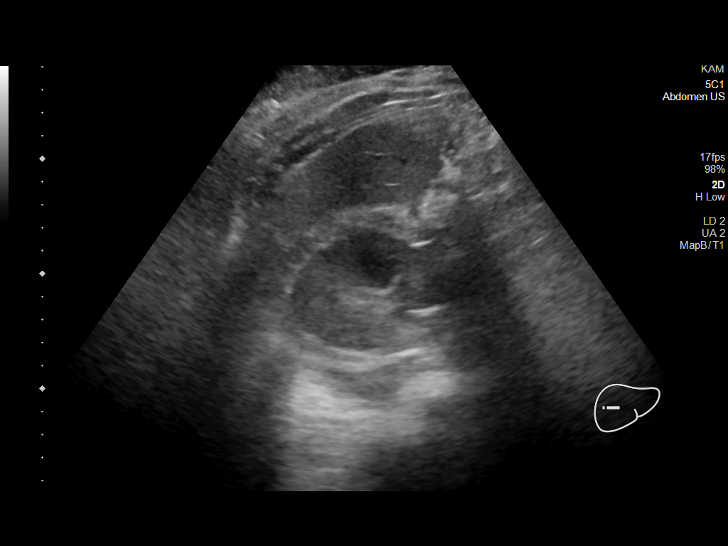
[im 73/73]
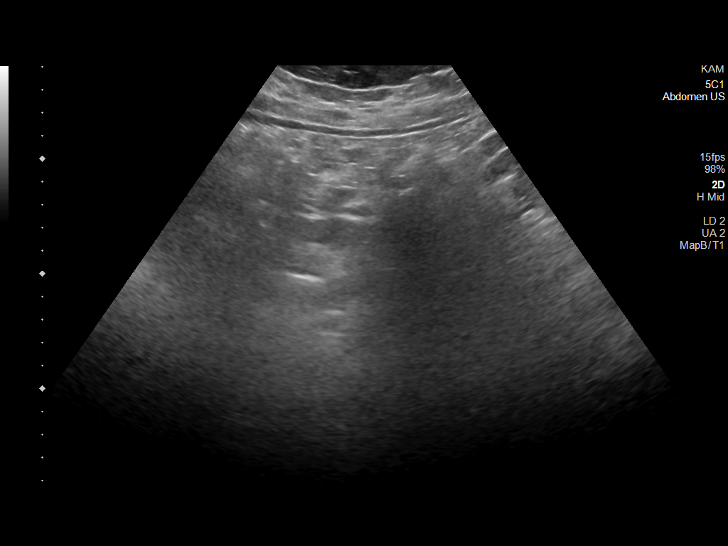

[14 of 25 positions shown; findings below may reference images not displayed]

FINDINGS: Gallbladder:

Gallbladder is contracted. Mild wall thickening, likely related to
contracted state. No visible stones or sonographic Murphy sign.

Common bile duct:

Diameter: Normal caliber, 2 mm

Liver:

No focal lesion identified. Within normal limits in parenchymal
echogenicity. Portal vein is patent on color Doppler imaging with
normal direction of blood flow towards the liver.

Other: None.
IMPRESSION: No visible acute finding.  Gallbladder is contracted.  No stones.

## 2023-05-08 ENCOUNTER — Other Ambulatory Visit: Payer: Self-pay | Admitting: Family Medicine

## 2023-05-26 ENCOUNTER — Ambulatory Visit (INDEPENDENT_AMBULATORY_CARE_PROVIDER_SITE_OTHER): Payer: 59 | Admitting: Family Medicine

## 2023-05-26 VITALS — BP 109/77 | HR 95 | Temp 97.5°F | Ht 65.0 in | Wt 235.0 lb

## 2023-05-26 DIAGNOSIS — E1159 Type 2 diabetes mellitus with other circulatory complications: Secondary | ICD-10-CM

## 2023-05-26 DIAGNOSIS — N644 Mastodynia: Secondary | ICD-10-CM

## 2023-05-26 DIAGNOSIS — K219 Gastro-esophageal reflux disease without esophagitis: Secondary | ICD-10-CM | POA: Diagnosis not present

## 2023-05-26 DIAGNOSIS — I152 Hypertension secondary to endocrine disorders: Secondary | ICD-10-CM

## 2023-05-26 DIAGNOSIS — F4329 Adjustment disorder with other symptoms: Secondary | ICD-10-CM | POA: Diagnosis not present

## 2023-05-26 MED ORDER — PANTOPRAZOLE SODIUM 40 MG PO TBEC: 40.0000 mg | DELAYED_RELEASE_TABLET | Freq: Every day | ORAL | 3 refills | Status: DC

## 2023-05-26 NOTE — Progress Notes (Signed)
   Peggy Schmidt is a 42 y.o. female who presents today for an office visit.  Assessment/Plan:  New/Acute Problems: Breast Pain History is atypical and actually could be due to underlying reflux given that it seems to be exacerbated by eating certain greasy foods however would be reasonable to check a mammogram and potential diagnostic ultrasound at this point.  Her exam was reassuring. Will place referral for mammogram.  Will be treating underlying reflux as below.  Chronic Problems Addressed Today: GERD (gastroesophageal reflux disease) Will restart Protonix 40 mg daily.  Likely contributing to her above symptoms.  She will let us know if symptoms or not adequately controlled with this.  Would consider referral to GI versus right upper quadrant ultrasound if not improved with Protonix.  Hypertension associated with diabetes (HCC) Blood pressure at goal today on olmesartan-HCTZ 20-12.5 half tablet daily.   Adjustment disorder Doing much better on amitriptyline 25 mg nightly.  She feels like this is a good dose.  Does not need refill today.     Subjective:  HPI:  See Assessment / plan for status of chronic conditions.  Main concern today is right sided breast pain. This stared about a year or two ago. Gets worse after eating certain foods. Feels like a sharp pain. Lasts for about 10 minutes and then goes away. No bleeding no nipple discharge.  She has noticed that when she is taking acid blocker such as Protonix her symptoms are resolved       Objective:  Physical Exam: BP 109/77   Pulse 95   Temp (!) 97.5 F (36.4 C) (Temporal)   Ht 5\' 5"  (1.651 m)   Wt 235 lb (106.6 kg)   SpO2 96%   BMI 39.11 kg/m   Gen: No acute distress, resting comfortably CV: Regular rate and rhythm with no murmurs appreciated Pulm: Normal work of breathing, clear to auscultation bilaterally with no crackles, wheezes, or rhonchi Breast: No obvious masses, lumps, or deformities.  Nontender to palpation  bilaterally.  Chaperone present for exam. Neuro: Grossly normal, moves all extremities Psych: Normal affect and thought content      Evalin Shawhan M. Jimmey Ralph, MD 05/26/2023 11:36 AM

## 2023-05-26 NOTE — Assessment & Plan Note (Signed)
Blood pressure at goal today on olmesartan-HCTZ 20-12.5 half tablet daily.  

## 2023-05-26 NOTE — Patient Instructions (Addendum)
It was very nice to see you today!  Your symptoms may be coming from the reflux. I will refill your protonix but we will also get a mammogram.   Return if symptoms worsen or fail to improve.   Take care, Dr Jimmey Ralph  PLEASE NOTE:  If you had any lab tests, please let us know if you have not heard back within a few days. You may see your results on mychart before we have a chance to review them but we will give you a call once they are reviewed by [place].   If we ordered any referrals today, please let us know if you have not heard from their office within the next week.   If you had any urgent prescriptions sent in today, please check with the pharmacy within an hour of our visit to make sure the prescription was transmitted appropriately.   Please try these tips to maintain a healthy lifestyle:  Eat at least 3 REAL meals and 1-2 snacks per day.  Aim for no more than 5 hours between eating.  If you eat breakfast, please do so within one hour of getting up.   Each meal should contain half fruits/vegetables, one quarter protein, and one quarter carbs (no bigger than a computer mouse)  Cut down on sweet beverages. This includes juice, soda, and sweet tea.   Drink at least 1 glass of water with each meal and aim for at least 8 glasses per day  Exercise at least 150 minutes every week.

## 2023-05-26 NOTE — Assessment & Plan Note (Addendum)
Will restart Protonix 40 mg daily.  Likely contributing to her above symptoms.  She will let us know if symptoms or not adequately controlled with this.  Would consider referral to GI versus right upper quadrant ultrasound if not improved with Protonix.

## 2023-05-26 NOTE — Assessment & Plan Note (Signed)
Doing much better on amitriptyline 25 mg nightly.  She feels like this is a good dose.  Does not need refill today.

## 2023-06-01 ENCOUNTER — Ambulatory Visit (INDEPENDENT_AMBULATORY_CARE_PROVIDER_SITE_OTHER): Payer: 59 | Admitting: *Deleted

## 2023-06-01 DIAGNOSIS — Z3042 Encounter for surveillance of injectable contraceptive: Secondary | ICD-10-CM

## 2023-06-01 DIAGNOSIS — Z309 Encounter for contraceptive management, unspecified: Secondary | ICD-10-CM

## 2023-06-01 MED ORDER — MEDROXYPROGESTERONE ACETATE 150 MG/ML IM SUSP
150.0000 mg | Freq: Once | INTRAMUSCULAR | Status: AC
  Administered 2023-06-01: 150 mg via INTRAMUSCULAR

## 2023-06-01 NOTE — Progress Notes (Signed)
Pt is here for Depo injection. Injection given in right deltoid per patient preference. Pt tolerated well. Patient reminded to return in 3 months for injection.

## 2023-06-01 NOTE — Progress Notes (Signed)
I have reviewed the patient's encounter and agree with the documentation.  Katina Degree. Jimmey Ralph, MD 06/01/2023 2:28 PM

## 2023-06-09 ENCOUNTER — Ambulatory Visit (INDEPENDENT_AMBULATORY_CARE_PROVIDER_SITE_OTHER): Payer: 59 | Admitting: Family

## 2023-06-09 ENCOUNTER — Encounter: Payer: Self-pay | Admitting: Family

## 2023-06-09 VITALS — BP 108/74 | HR 95 | Temp 98.0°F | Ht 65.0 in | Wt 240.2 lb

## 2023-06-09 DIAGNOSIS — J029 Acute pharyngitis, unspecified: Secondary | ICD-10-CM

## 2023-06-09 LAB — POCT RAPID STREP A (OFFICE): Rapid Strep A Screen: NEGATIVE

## 2023-06-09 MED ORDER — IBUPROFEN 800 MG PO TABS: 800.0000 mg | ORAL_TABLET | Freq: Three times a day (TID) | ORAL | 0 refills | Status: DC | PRN
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
## 2023-06-28 ENCOUNTER — Other Ambulatory Visit: Payer: Self-pay | Admitting: Family Medicine

## 2023-06-28 ENCOUNTER — Encounter: Payer: Self-pay | Admitting: Family Medicine

## 2023-06-28 DIAGNOSIS — N644 Mastodynia: Secondary | ICD-10-CM

## 2023-06-30 ENCOUNTER — Ambulatory Visit: Payer: 59 | Admitting: Family Medicine

## 2023-06-30 ENCOUNTER — Encounter: Payer: Self-pay | Admitting: Family Medicine

## 2023-06-30 VITALS — BP 106/72 | HR 83 | Temp 98.0°F | Ht 65.0 in | Wt 235.0 lb

## 2023-06-30 DIAGNOSIS — Z7985 Long-term (current) use of injectable non-insulin antidiabetic drugs: Secondary | ICD-10-CM | POA: Diagnosis not present

## 2023-06-30 DIAGNOSIS — J029 Acute pharyngitis, unspecified: Secondary | ICD-10-CM

## 2023-06-30 DIAGNOSIS — I152 Hypertension secondary to endocrine disorders: Secondary | ICD-10-CM

## 2023-06-30 DIAGNOSIS — E1169 Type 2 diabetes mellitus with other specified complication: Secondary | ICD-10-CM | POA: Diagnosis not present

## 2023-06-30 DIAGNOSIS — E1159 Type 2 diabetes mellitus with other circulatory complications: Secondary | ICD-10-CM | POA: Diagnosis not present

## 2023-06-30 LAB — POCT RAPID STREP A (OFFICE): Rapid Strep A Screen: POSITIVE — AB

## 2023-06-30 MED ORDER — BLOOD PRESSURE KIT: PACK | 0 refills | Status: AC

## 2023-06-30 MED ORDER — AMOXICILLIN 500 MG PO CAPS: 500.0000 mg | ORAL_CAPSULE | Freq: Two times a day (BID) | ORAL | 0 refills | Status: AC

## 2023-06-30 NOTE — Patient Instructions (Signed)
It was very nice to see you today!  You have strep throat.  Please start the amoxicillin.  Please get your blood pressure pill in half.  Follow-up with [place] in a few weeks to let us know how you are doing.  We can check your blood sugar That you come in.  Return in about 3 months (around 09/30/2023).   Take care, Dr Jimmey Ralph  PLEASE NOTE:  If you had any lab tests, please let us know if you have not heard back within a few days. You may see your results on mychart before we have a chance to review them but we will give you a call once they are reviewed by [place].   If we ordered any referrals today, please let us know if you have not heard from their office within the next week.   If you had any urgent prescriptions sent in today, please check with the pharmacy within an hour of our visit to make sure the prescription was transmitted appropriately.   Please try these tips to maintain a healthy lifestyle:  Eat at least 3 REAL meals and 1-2 snacks per day.  Aim for no more than 5 hours between eating.  If you eat breakfast, please do so within one hour of getting up.   Each meal should contain half fruits/vegetables, one quarter protein, and one quarter carbs (no bigger than a computer mouse)  Cut down on sweet beverages. This includes juice, soda, and sweet tea.   Drink at least 1 glass of water with each meal and aim for at least 8 glasses per day  Exercise at least 150 minutes every week.

## 2023-06-30 NOTE — Progress Notes (Signed)
   Peggy Schmidt is a 42 y.o. female who presents today for an office visit.  Assessment/Plan:  New/Acute Problems: Strep Pharyngitis  Rapid strep positive.  Start amoxicillin.  Can use over-the-counter meds as needed as well.  Encouraged hydration.  Discussed reasons to return to care.  Follow-up as needed.  Chronic Problems Addressed Today: Hypertension associated with diabetes (HCC) Blood pressure very well-controlled today on olmesartan-HCTZ 20-12.5 once daily.  She is interested in weaning down dose of medication.  She will go to half tablet.  She will monitor at home and follow-up with [place] in a few weeks.  DM (diabetes mellitus), type 2 (HCC) Too early to recheck A1c today.  She is working on lifestyle modifications.  She is down 5 pounds since her last visit here.  Continue Ozempic 2 mg weekly.  Recheck A1c at next office visit.     Subjective:  HPI:  See Assessment / plan for status of chronic conditions.   She has had sore throat for the last few weeks. No treatments tried. She did get tested for strep a couple of weeks ago which was negative. Pain has been consistent since then. No fevers or chills.  She has had some rhinorrhea.       Objective:  Physical Exam: BP 106/72   Pulse 83   Temp 98 F (36.7 C) (Temporal)   Ht 5\' 5"  (1.651 m)   Wt 235 lb (106.6 kg)   SpO2 98%   BMI 39.11 kg/m   Wt Readings from Last 3 Encounters:  06/30/23 235 lb (106.6 kg)  06/09/23 240 lb 3.2 oz (109 kg)  05/26/23 235 lb (106.6 kg)    Gen: No acute distress, resting comfortably HEENT: Bilateral tonsillar hypertrophy noted.  No exudates.  No lymphadenopathy. CV: Regular rate and rhythm with no murmurs appreciated Pulm: Normal work of breathing, clear to auscultation bilaterally with no crackles, wheezes, or rhonchi Neuro: Grossly normal, moves all extremities Psych: Normal affect and thought content      [name] M. Jimmey Ralph, MD 06/30/2023 8:42 AM

## 2023-06-30 NOTE — Assessment & Plan Note (Signed)
Blood pressure very well-controlled today on olmesartan-HCTZ 20-12.5 once daily.  She is interested in weaning down dose of medication.  She will go to half tablet.  She will monitor at home and follow-up with [place] in a few weeks.

## 2023-06-30 NOTE — Assessment & Plan Note (Signed)
Too early to recheck A1c today.  She is working on lifestyle modifications.  She is down 5 pounds since her last visit here.  Continue Ozempic 2 mg weekly.  Recheck A1c at next office visit.

## 2023-07-03 ENCOUNTER — Telehealth: Payer: Self-pay | Admitting: *Deleted

## 2023-07-03 ENCOUNTER — Other Ambulatory Visit: Payer: Self-pay | Admitting: *Deleted

## 2023-07-03 MED ORDER — AZITHROMYCIN 250 MG PO TABS: ORAL_TABLET | ORAL | 0 refills | Status: AC

## 2023-07-03 NOTE — Telephone Encounter (Signed)
She should stop the amoxicillin.  Please send in Z-Pak instead.  Katina Degree. Jimmey Ralph, MD 07/03/2023 10:41 AM

## 2023-07-03 NOTE — Telephone Encounter (Signed)
Rx Amoxicillin giving her reaction  Vomiting and nausea, took medication 3 days with same symptoms    Patient stopped medication  Please advise

## 2023-07-03 NOTE — Telephone Encounter (Signed)
Z-pack send to pharmacy  Patient notified, stop amoxicillin  New Rx send in

## 2023-07-05 ENCOUNTER — Ambulatory Visit
Admission: RE | Admit: 2023-07-05 | Discharge: 2023-07-05 | Disposition: A | Discharge status: Home / Self Care | Payer: 59 | Source: Ambulatory Visit | Attending: Family Medicine | Admitting: Family Medicine

## 2023-07-05 DIAGNOSIS — N644 Mastodynia: Secondary | ICD-10-CM

## 2023-08-10 ENCOUNTER — Ambulatory Visit: Payer: 59

## 2023-08-23 ENCOUNTER — Ambulatory Visit: Payer: 59 | Admitting: Neurology

## 2023-08-24 ENCOUNTER — Ambulatory Visit (INDEPENDENT_AMBULATORY_CARE_PROVIDER_SITE_OTHER): Payer: 59 | Admitting: *Deleted

## 2023-08-24 DIAGNOSIS — Z3042 Encounter for surveillance of injectable contraceptive: Secondary | ICD-10-CM

## 2023-08-24 MED ORDER — MEDROXYPROGESTERONE ACETATE 150 MG/ML IM SUSP
150.0000 mg | Freq: Once | INTRAMUSCULAR | Status: AC
  Administered 2023-08-24: 150 mg via INTRAMUSCULAR

## 2023-08-24 NOTE — Progress Notes (Signed)
Patient present for Depo Provera inj  Given on Rt deltoid  Patient tolerated well  Advise to schedule next Depo Provera Inj between Nov 21- Dec 05,2024 Patient verbalized understanding

## 2023-09-01 ENCOUNTER — Ambulatory Visit: Payer: 59 | Admitting: Family Medicine

## 2023-09-01 ENCOUNTER — Other Ambulatory Visit: Payer: Self-pay | Admitting: Family Medicine

## 2023-09-01 ENCOUNTER — Encounter: Payer: Self-pay | Admitting: Family Medicine

## 2023-09-01 VITALS — BP 117/84 | HR 84 | Temp 97.1°F | Ht 65.0 in | Wt 236.0 lb

## 2023-09-01 DIAGNOSIS — Z7985 Long-term (current) use of injectable non-insulin antidiabetic drugs: Secondary | ICD-10-CM | POA: Diagnosis not present

## 2023-09-01 DIAGNOSIS — E1169 Type 2 diabetes mellitus with other specified complication: Secondary | ICD-10-CM | POA: Diagnosis not present

## 2023-09-01 DIAGNOSIS — R059 Cough, unspecified: Secondary | ICD-10-CM

## 2023-09-01 DIAGNOSIS — I152 Hypertension secondary to endocrine disorders: Secondary | ICD-10-CM

## 2023-09-01 DIAGNOSIS — K219 Gastro-esophageal reflux disease without esophagitis: Secondary | ICD-10-CM

## 2023-09-01 DIAGNOSIS — E1159 Type 2 diabetes mellitus with other circulatory complications: Secondary | ICD-10-CM

## 2023-09-01 DIAGNOSIS — J309 Allergic rhinitis, unspecified: Secondary | ICD-10-CM

## 2023-09-01 LAB — POC COVID19 BINAXNOW: SARS Coronavirus 2 Ag: NEGATIVE

## 2023-09-01 LAB — POCT GLYCOSYLATED HEMOGLOBIN (HGB A1C): Hemoglobin A1C: 6.3 % — AB (ref 4.0–5.6)

## 2023-09-01 MED ORDER — FAMOTIDINE 20 MG PO TABS: ORAL_TABLET | ORAL | 5 refills | Status: AC

## 2023-09-01 MED ORDER — OLMESARTAN MEDOXOMIL-HCTZ 20-12.5 MG PO TABS: 0.5000 | ORAL_TABLET | Freq: Every day | ORAL | Status: DC

## 2023-09-01 NOTE — Assessment & Plan Note (Signed)
Blood pressure at goal today on olmesartan-HCTZ 10-6.25 once daily.  She is not checking blood pressure at home.  Will continue current dose for now.  If continues to be on the lower side would consider de-escalating dose though she needs to check at home before we can do this.  She will get a blood pressure cuff and try to do this more consistently.

## 2023-09-01 NOTE — Assessment & Plan Note (Addendum)
Follows with allergy for this.  Likely source of her nasal congestion.  She we will continue taking Xyzal and nasal sprays per allergy.

## 2023-09-01 NOTE — Assessment & Plan Note (Signed)
A1c up a bit 6.3.  She will restart Ozempic after being off this for the last few weeks.  We did discuss switching to [facility] however she declined.  Recheck A1c in 3 months.  She will continue to work on lifestyle modifications.

## 2023-09-01 NOTE — Progress Notes (Signed)
   Peggy Schmidt is a 42 y.o. female who presents today for an office visit.  Assessment/Plan:  New/Acute Problems: Nasal Congestion  COVID-negative.  Likely due to seasonal allergies.  She can continue taking her antihistamines and nasal sprays as needed.  She will let us know if not proving in the next couple of weeks.  Chronic Problems Addressed Today: DM (diabetes mellitus), type 2 (HCC) A1c up a bit 6.3.  She will restart Ozempic after being off this for the last few weeks.  We did discuss switching to [facility] however she declined.  Recheck A1c in 3 months.  She will continue to work on lifestyle modifications.  Hypertension associated with diabetes (HCC) Blood pressure at goal today on olmesartan-HCTZ 10-6.25 once daily.  She is not checking blood pressure at home.  Will continue current dose for now.  If continues to be on the lower side would consider de-escalating dose though she needs to check at home before we can do this.  She will get a blood pressure cuff and try to do this more consistently.  Allergic rhinitis Follows with allergy for this.  Likely source of her nasal congestion.  She we will continue taking Xyzal and nasal sprays per allergy.     Subjective:  HPI:  See A/P for status of chronic conditions.  Patient is here today to discuss diabetes.  She did stop her Ozempic a few weeks ago. Was not having any issues but she just wanted to see how she did off of it. She is working on diet and exercise. Doing a lot of walking. Going to [employer]. Trying to cut down on soda and portion sizes.   She has been having some nasal congestion for the last week or so.  No fever or chills.  Minimal cough.  She does have allergies and is currently taking Xyzal for this.  Allergies usually worse during the summer.       Objective:  Physical Exam: BP 117/84   Pulse 84   Temp (!) 97.1 F (36.2 C) (Temporal)   Ht 5\' 5"  (1.651 m)   Wt 236 lb (107 kg)   SpO2 100%   BMI  39.27 kg/m   Wt Readings from Last 3 Encounters:  09/01/23 236 lb (107 kg)  06/30/23 235 lb (106.6 kg)  06/09/23 240 lb 3.2 oz (109 kg)    Gen: No acute distress, resting comfortably CV: Regular rate and rhythm with no murmurs appreciated Pulm: Normal work of breathing, clear to auscultation bilaterally with no crackles, wheezes, or rhonchi Neuro: Grossly normal, moves all extremities Psych: Normal affect and thought content      Namiyah Grantham M. Jimmey Ralph, MD 09/01/2023 9:28 AM

## 2023-09-01 NOTE — Patient Instructions (Signed)
It was very nice to see you today!  Your A1c is up a bit.  Please restart the Ozempic.  Please continue taking half a pill of your blood pressure pill daily.  Your COVID test was negative.  Return in about 3 months (around 12/01/2023) for Follow Up.   Take care, Dr Jimmey Ralph  PLEASE NOTE:  If you had any lab tests, please let us know if you have not heard back within a few days. You may see your results on mychart before we have a chance to review them but we will give you a call once they are reviewed by [place].   If we ordered any referrals today, please let us know if you have not heard from their office within the next week.   If you had any urgent prescriptions sent in today, please check with the pharmacy within an hour of our visit to make sure the prescription was transmitted appropriately.   Please try these tips to maintain a healthy lifestyle:  Eat at least 3 REAL meals and 1-2 snacks per day.  Aim for no more than 5 hours between eating.  If you eat breakfast, please do so within one hour of getting up.   Each meal should contain half fruits/vegetables, one quarter protein, and one quarter carbs (no bigger than a computer mouse)  Cut down on sweet beverages. This includes juice, soda, and sweet tea.   Drink at least 1 glass of water with each meal and aim for at least 8 glasses per day  Exercise at least 150 minutes every week.

## 2023-09-12 ENCOUNTER — Telehealth: Payer: Self-pay | Admitting: Family Medicine

## 2023-09-12 ENCOUNTER — Other Ambulatory Visit: Payer: Self-pay | Admitting: *Deleted

## 2023-09-12 MED ORDER — OLMESARTAN MEDOXOMIL-HCTZ 20-12.5 MG PO TABS: 0.5000 | ORAL_TABLET | Freq: Every day | ORAL | 1 refills | Status: DC

## 2023-09-12 NOTE — Telephone Encounter (Signed)
Rx send to pharmacy  

## 2023-09-12 NOTE — Telephone Encounter (Signed)
PATIENT STATES SHE ONLY HAS HALF A PILL LEFT. Last RX order seems to not have been sent in correctly.   Prescription Request  09/12/2023  LOV: 09/01/2023  What is the name of the medication or equipment? olmesartan-hydrochlorothiazide (BENICAR HCT) 20-12.5 MG tablet   Have you contacted your pharmacy to request a refill? Yes   Which pharmacy would you like this sent to?  [facility] [id number] [address], [place] - [address] [place] [address] Phone: [phone] Fax: [phone]     Patient notified that their request is being sent to the clinical staff for review and that they should receive a response within 2 business days.   Please advise at Mobile [phone] (mobile)

## 2023-09-22 ENCOUNTER — Ambulatory Visit: Payer: 59 | Admitting: Family Medicine

## 2023-10-09 ENCOUNTER — Ambulatory Visit: Payer: 59 | Admitting: Family Medicine

## 2023-10-24 ENCOUNTER — Telehealth: Payer: Self-pay | Admitting: Family Medicine

## 2023-10-24 ENCOUNTER — Encounter: Payer: Self-pay | Admitting: Family Medicine

## 2023-10-24 NOTE — Telephone Encounter (Signed)
Patient dropped off document FMLA, to be filled out by provider. Patient requested to send it back via Call Patient to pick up within 5-days. Document is located in providers tray at front office.Please advise at Mobile [phone] (mobile)

## 2023-10-25 NOTE — Telephone Encounter (Signed)
Patient aware need OV

## 2023-11-02 ENCOUNTER — Encounter: Payer: Self-pay | Admitting: Family Medicine

## 2023-11-02 ENCOUNTER — Ambulatory Visit: Payer: 59

## 2023-11-02 ENCOUNTER — Ambulatory Visit: Payer: 59 | Admitting: Family Medicine

## 2023-11-02 VITALS — BP 121/76 | HR 83 | Temp 98.0°F | Ht 65.0 in | Wt 231.2 lb

## 2023-11-02 DIAGNOSIS — E1169 Type 2 diabetes mellitus with other specified complication: Secondary | ICD-10-CM | POA: Diagnosis not present

## 2023-11-02 DIAGNOSIS — I152 Hypertension secondary to endocrine disorders: Secondary | ICD-10-CM

## 2023-11-02 DIAGNOSIS — F4329 Adjustment disorder with other symptoms: Secondary | ICD-10-CM

## 2023-11-02 DIAGNOSIS — N939 Abnormal uterine and vaginal bleeding, unspecified: Secondary | ICD-10-CM

## 2023-11-02 DIAGNOSIS — Z7985 Long-term (current) use of injectable non-insulin antidiabetic drugs: Secondary | ICD-10-CM

## 2023-11-02 DIAGNOSIS — Z789 Other specified health status: Secondary | ICD-10-CM

## 2023-11-02 DIAGNOSIS — Z0279 Encounter for issue of other medical certificate: Secondary | ICD-10-CM

## 2023-11-02 DIAGNOSIS — E1159 Type 2 diabetes mellitus with other circulatory complications: Secondary | ICD-10-CM

## 2023-11-02 MED ORDER — MEDROXYPROGESTERONE ACETATE 150 MG/ML IM SUSP
150.0000 mg | Freq: Once | INTRAMUSCULAR | Status: AC
Start: 2023-11-02 — End: 2023-11-02
  Administered 2023-11-02: 150 mg via INTRAMUSCULAR

## 2023-11-02 NOTE — Addendum Note (Signed)
Addended by: Dyann Kief on: 11/02/2023 12:05 PM   Modules accepted: Orders

## 2023-11-02 NOTE — Assessment & Plan Note (Signed)
Overall symptoms are stable on amitriptyline 25 mg nightly.  This does help with both her depressive and anxious symptoms however she does still intermittently have flareups.  Will complete FMLA paperwork today.  She would like to continue with current dose of amitriptyline.

## 2023-11-02 NOTE — Patient Instructions (Signed)
It was very nice to see you today!  We completed your Family Medical Leave Act [facility]) paperwork today.  We will give you your depo shot.  We will see you back next month.   Return if symptoms worsen or fail to improve.   Take care, Dr Jimmey Ralph  PLEASE NOTE:  If you had any lab tests, please let us know if you have not heard back within a few days. You may see your results on mychart before we have a chance to review them but we will give you a call once they are reviewed by [place].   If we ordered any referrals today, please let us know if you have not heard from their office within the next week.   If you had any urgent prescriptions sent in today, please check with the pharmacy within an hour of our visit to make sure the prescription was transmitted appropriately.   Please try these tips to maintain a healthy lifestyle:  Eat at least 3 REAL meals and 1-2 snacks per day.  Aim for no more than 5 hours between eating.  If you eat breakfast, please do so within one hour of getting up.   Each meal should contain half fruits/vegetables, one quarter protein, and one quarter carbs (no bigger than a computer mouse)  Cut down on sweet beverages. This includes juice, soda, and sweet tea.   Drink at least 1 glass of water with each meal and aim for at least 8 glasses per day  Exercise at least 150 minutes every week.

## 2023-11-02 NOTE — Assessment & Plan Note (Signed)
Has upcoming ablation.  Will give routine Depo-Provera injection today.

## 2023-11-02 NOTE — Assessment & Plan Note (Signed)
Blood pressure at goal today on losartan/HCTZ 10-6.25 once daily.

## 2023-11-02 NOTE — Assessment & Plan Note (Signed)
Too early to recheck A1c.  She is now back on Ozempic 2 mg weekly.  Doing well with this.  She will come back next month to recheck A1c.

## 2023-11-02 NOTE — Progress Notes (Signed)
   Peggy Schmidt is a 42 y.o. female who presents today for an office visit.  Assessment/Plan:  Chronic Problems Addressed Today: Hypertension associated with diabetes (HCC) Blood pressure at goal today on losartan/HCTZ 10-6.25 once daily.   Abnormal uterine bleeding (AUB) Has upcoming ablation.  Will give routine Depo-Provera injection today.  DM (diabetes mellitus), type 2 (HCC) Too early to recheck A1c.  She is now back on Ozempic 2 mg weekly.  Doing well with this.  She will come back next month to recheck A1c.  Adjustment disorder Overall symptoms are stable on amitriptyline 25 mg nightly.  This does help with both her depressive and anxious symptoms however she does still intermittently have flareups.  Will complete FMLA paperwork today.  She would like to continue with current dose of amitriptyline.     Subjective:  HPI:  See Assessment / plan for status of chronic conditions.  Patient here today for follow-up.  I saw her a couple months ago.  She has done well over that time.  She would like to have FMLA paperwork completed today.  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  She is currently on amitriptyline 25 mg nightly which does work well however she does still have intermittent flareups that limit her ability to work.       Objective:  Physical Exam: BP 121/76   Pulse 83   Temp 98 F (36.7 C) (Temporal)   Ht 5\' 5"  (1.651 m)   Wt 231 lb 3.2 oz (104.9 kg)   SpO2 99%   BMI 38.47 kg/m   Wt Readings from Last 3 Encounters:  11/02/23 231 lb 3.2 oz (104.9 kg)  09/01/23 236 lb (107 kg)  06/30/23 235 lb (106.6 kg)    Gen: No acute distress, resting comfortably Neuro: Grossly normal, moves all extremities Psych: Normal affect and thought content      Peggy Schmidt M. Jimmey Ralph, MD 11/02/2023 11:22 AM

## 2023-11-08 ENCOUNTER — Other Ambulatory Visit: Payer: Self-pay | Admitting: Obstetrics & Gynecology

## 2023-11-08 DIAGNOSIS — N711 Chronic inflammatory disease of uterus: Secondary | ICD-10-CM

## 2023-11-08 DIAGNOSIS — N939 Abnormal uterine and vaginal bleeding, unspecified: Secondary | ICD-10-CM

## 2023-11-09 ENCOUNTER — Ambulatory Visit: Payer: 59

## 2023-11-13 ENCOUNTER — Encounter: Payer: Self-pay | Admitting: Lactation Services

## 2023-12-01 ENCOUNTER — Ambulatory Visit: Payer: 59 | Admitting: Family Medicine

## 2023-12-19 ENCOUNTER — Other Ambulatory Visit: Payer: Self-pay | Admitting: Family Medicine

## 2024-01-12 LAB — HM DIABETES EYE EXAM

## 2024-01-15 ENCOUNTER — Encounter: Payer: Self-pay | Admitting: Optometry

## 2024-01-18 ENCOUNTER — Other Ambulatory Visit (HOSPITAL_COMMUNITY)
Admission: RE | Admit: 2024-01-18 | Discharge: 2024-01-18 | Disposition: A | Discharge status: Home / Self Care | Payer: 59 | Source: Ambulatory Visit | Attending: Family Medicine | Admitting: Family Medicine

## 2024-01-18 ENCOUNTER — Ambulatory Visit: Payer: 59 | Admitting: Family Medicine

## 2024-01-18 ENCOUNTER — Encounter: Payer: Self-pay | Admitting: Family Medicine

## 2024-01-18 VITALS — BP 114/78 | HR 72 | Temp 98.1°F | Ht 65.0 in | Wt 224.2 lb

## 2024-01-18 DIAGNOSIS — E1159 Type 2 diabetes mellitus with other circulatory complications: Secondary | ICD-10-CM

## 2024-01-18 DIAGNOSIS — E1169 Type 2 diabetes mellitus with other specified complication: Secondary | ICD-10-CM

## 2024-01-18 DIAGNOSIS — Z113 Encounter for screening for infections with a predominantly sexual mode of transmission: Secondary | ICD-10-CM | POA: Diagnosis not present

## 2024-01-18 DIAGNOSIS — I152 Hypertension secondary to endocrine disorders: Secondary | ICD-10-CM | POA: Diagnosis not present

## 2024-01-18 LAB — POCT GLYCOSYLATED HEMOGLOBIN (HGB A1C): Hemoglobin A1C: 6 % — AB (ref 4.0–5.6)

## 2024-01-18 LAB — MICROALBUMIN / CREATININE URINE RATIO
Creatinine,U: 152.4 mg/dL
Microalb Creat Ratio: 2.8 mg/g (ref 0.0–30.0)
Microalb, Ur: 4.2 mg/dL — ABNORMAL HIGH (ref 0.0–1.9)

## 2024-01-18 NOTE — Assessment & Plan Note (Signed)
Blood pressure at goal today.  Continue olmesartan HCTZ 20-12.5 half tablet daily.  She will continue to monitor at home as well.  If continues to be low we can further de-escalate dose.

## 2024-01-18 NOTE — Assessment & Plan Note (Signed)
A1c at goal 6.0.  She is doing well with Ozempic 2 mg weekly.  We will continue this.  Recheck A1c in 3 to 6 months.  If A1c continues to be at goal we can discuss decreasing dose at future office visit.

## 2024-01-18 NOTE — Progress Notes (Signed)
   Peggy Schmidt is a 43 y.o. female who presents today for an office visit.  Assessment/Plan:  New/Acute Problems: STD screening Check urine cytology.  Offered HIV and RPR screening however she declined.  She is not currently having any symptoms.  Chronic Problems Addressed Today: DM (diabetes mellitus), type 2 (HCC) A1c at goal 6.0.  She is doing well with Ozempic 2 mg weekly.  We will continue this.  Recheck A1c in 3 to 6 months.  If A1c continues to be at goal we can discuss decreasing dose at future office visit.  Hypertension associated with diabetes (HCC) Blood pressure at goal today.  Continue olmesartan HCTZ 20-12.5 half tablet daily.  She will continue to monitor at home as well.  If continues to be low we can further de-escalate dose.     Subjective:  HPI:  See Assessment / plan for status of chronic conditions.  Patient here today for follow-up.  I saw her a few months ago.  She has been on Ozempic 2 mg weekly.  She has done well with this. No side effects. She is trying to stay more active.  She has not made any changes to her diet or eating plan.       Objective:  Physical Exam: BP 114/78   Pulse 72   Temp 98.1 F (36.7 C) (Temporal)   Ht 5\' 5"  (1.651 m)   Wt 224 lb 3.2 oz (101.7 kg)   SpO2 99%   BMI 37.31 kg/m   Wt Readings from Last 3 Encounters:  01/18/24 224 lb 3.2 oz (101.7 kg)  11/02/23 231 lb 3.2 oz (104.9 kg)  09/01/23 236 lb (107 kg)    Gen: No acute distress, resting comfortably CV: Regular rate and rhythm with no murmurs appreciated Pulm: Normal work of breathing, clear to auscultation bilaterally with no crackles, wheezes, or rhonchi Neuro: Grossly normal, moves all extremities Psych: Normal affect and thought content      Tationa Stech M. Jimmey Ralph, MD 01/18/2024 11:45 AM

## 2024-01-18 NOTE — Patient Instructions (Signed)
It was very nice to see you today!  Your A1c looks great at 6.0.  Please continue to work on diet and exercise.  No medication changes today.  We will check a urine sample.  I will see you back in 6 months for your follow-up visit.  Please come back sooner if needed.  Return in about 6 months (around 07/17/2024).   Take care, Dr Jimmey Ralph  PLEASE NOTE:  If you had any lab tests, please let us know if you have not heard back within a few days. You may see your results on mychart before we have a chance to review them but we will give you a call once they are reviewed by [place].   If we ordered any referrals today, please let us know if you have not heard from their office within the next week.   If you had any urgent prescriptions sent in today, please check with the pharmacy within an hour of our visit to make sure the prescription was transmitted appropriately.   Please try these tips to maintain a healthy lifestyle:  Eat at least 3 REAL meals and 1-2 snacks per day.  Aim for no more than 5 hours between eating.  If you eat breakfast, please do so within one hour of getting up.   Each meal should contain half fruits/vegetables, one quarter protein, and one quarter carbs (no bigger than a computer mouse)  Cut down on sweet beverages. This includes juice, soda, and sweet tea.   Drink at least 1 glass of water with each meal and aim for at least 8 glasses per day  Exercise at least 150 minutes every week.

## 2024-01-19 ENCOUNTER — Encounter: Payer: Self-pay | Admitting: Family Medicine

## 2024-01-19 LAB — URINE CYTOLOGY ANCILLARY ONLY
Chlamydia: NEGATIVE
Comment: NEGATIVE
Comment: NEGATIVE
Comment: NORMAL
Neisseria Gonorrhea: NEGATIVE
Trichomonas: NEGATIVE

## 2024-01-19 NOTE — Progress Notes (Signed)
Urine test is normal.  We can recheck in a year.

## 2024-01-22 NOTE — Progress Notes (Signed)
It is normal to have a little protein in the urine.  Her ratio is at goal.  Her results are at goal.  We can recheck in a year.

## 2024-02-09 ENCOUNTER — Telehealth: Payer: Self-pay | Admitting: Pharmacy Technician

## 2024-02-09 ENCOUNTER — Other Ambulatory Visit (HOSPITAL_COMMUNITY): Payer: Self-pay

## 2024-02-09 NOTE — Telephone Encounter (Signed)
Pharmacy Patient Advocate Encounter   Received notification from CoverMyMeds that prior authorization for OZEMPIC 2MG /DOSE is required/requested.   Insurance verification completed.   The patient is insured through [facility] .   Per test claim: The current 30 day co-pay is, $35.00.  No PA needed at this time. This test claim was processed through [facility]- copay amounts may vary at other pharmacies due to pharmacy/plan contracts, or as the patient moves through the different stages of their insurance plan.

## 2024-03-13 ENCOUNTER — Other Ambulatory Visit: Payer: Self-pay | Admitting: Family Medicine

## 2024-05-23 ENCOUNTER — Other Ambulatory Visit: Payer: Self-pay | Admitting: Family Medicine

## 2024-05-26 DIAGNOSIS — H5712 Ocular pain, left eye: Secondary | ICD-10-CM | POA: Diagnosis not present

## 2024-05-26 DIAGNOSIS — X58XXXA Exposure to other specified factors, initial encounter: Secondary | ICD-10-CM | POA: Diagnosis not present

## 2024-05-26 DIAGNOSIS — S0502XA Injury of conjunctiva and corneal abrasion without foreign body, left eye, initial encounter: Secondary | ICD-10-CM | POA: Diagnosis not present

## 2024-05-26 DIAGNOSIS — Z23 Encounter for immunization: Secondary | ICD-10-CM | POA: Diagnosis not present

## 2024-05-26 DIAGNOSIS — H53149 Visual discomfort, unspecified: Secondary | ICD-10-CM | POA: Diagnosis not present

## 2024-07-18 ENCOUNTER — Ambulatory Visit: Payer: 59 | Admitting: Family Medicine

## 2024-08-13 ENCOUNTER — Other Ambulatory Visit: Payer: Self-pay | Admitting: Family Medicine

## 2024-08-15 ENCOUNTER — Encounter (HOSPITAL_BASED_OUTPATIENT_CLINIC_OR_DEPARTMENT_OTHER): Payer: Self-pay | Admitting: Emergency Medicine

## 2024-08-15 ENCOUNTER — Ambulatory Visit: Payer: Self-pay

## 2024-08-15 ENCOUNTER — Emergency Department (HOSPITAL_BASED_OUTPATIENT_CLINIC_OR_DEPARTMENT_OTHER)
Admission: EM | Admit: 2024-08-15 | Discharge: 2024-08-15 | Disposition: A | Discharge status: Home / Self Care | Payer: Self-pay | Attending: Emergency Medicine | Admitting: Emergency Medicine

## 2024-08-15 ENCOUNTER — Other Ambulatory Visit: Payer: Self-pay

## 2024-08-15 ENCOUNTER — Other Ambulatory Visit: Payer: Self-pay | Admitting: Family Medicine

## 2024-08-15 DIAGNOSIS — I10 Essential (primary) hypertension: Secondary | ICD-10-CM | POA: Insufficient documentation

## 2024-08-15 DIAGNOSIS — E119 Type 2 diabetes mellitus without complications: Secondary | ICD-10-CM | POA: Insufficient documentation

## 2024-08-15 DIAGNOSIS — R1013 Epigastric pain: Secondary | ICD-10-CM | POA: Insufficient documentation

## 2024-08-15 DIAGNOSIS — Z79899 Other long term (current) drug therapy: Secondary | ICD-10-CM | POA: Insufficient documentation

## 2024-08-15 LAB — URINALYSIS, ROUTINE W REFLEX MICROSCOPIC
Bilirubin Urine: NEGATIVE
Glucose, UA: NEGATIVE mg/dL
Hgb urine dipstick: NEGATIVE
Ketones, ur: NEGATIVE mg/dL
Leukocytes,Ua: NEGATIVE
Nitrite: NEGATIVE
Protein, ur: NEGATIVE mg/dL
Specific Gravity, Urine: 1.03 (ref 1.005–1.030)
pH: 6 (ref 5.0–8.0)

## 2024-08-15 LAB — CBC WITH DIFFERENTIAL/PLATELET
Abs Immature Granulocytes: 0.04 K/uL (ref 0.00–0.07)
Basophils Absolute: 0 K/uL (ref 0.0–0.1)
Basophils Relative: 0 %
Eosinophils Absolute: 0.1 K/uL (ref 0.0–0.5)
Eosinophils Relative: 1 %
HCT: 44.1 % (ref 36.0–46.0)
Hemoglobin: 14.7 g/dL (ref 12.0–15.0)
Immature Granulocytes: 1 %
Lymphocytes Relative: 19 %
Lymphs Abs: 1.7 K/uL (ref 0.7–4.0)
MCH: 28.7 pg (ref 26.0–34.0)
MCHC: 33.3 g/dL (ref 30.0–36.0)
MCV: 86.1 fL (ref 80.0–100.0)
Monocytes Absolute: 0.6 K/uL (ref 0.1–1.0)
Monocytes Relative: 7 %
Neutro Abs: 6.4 K/uL (ref 1.7–7.7)
Neutrophils Relative %: 72 %
Platelets: 318 K/uL (ref 150–400)
RBC: 5.12 MIL/uL — ABNORMAL HIGH (ref 3.87–5.11)
RDW: 13.1 % (ref 11.5–15.5)
WBC: 8.8 K/uL (ref 4.0–10.5)
nRBC: 0 % (ref 0.0–0.2)

## 2024-08-15 LAB — COMPREHENSIVE METABOLIC PANEL WITH GFR
ALT: 22 U/L (ref 0–44)
AST: 18 U/L (ref 15–41)
Albumin: 4.4 g/dL (ref 3.5–5.0)
Alkaline Phosphatase: 92 U/L (ref 38–126)
Anion gap: 12 (ref 5–15)
BUN: 10 mg/dL (ref 6–20)
CO2: 22 mmol/L (ref 22–32)
Calcium: 9.5 mg/dL (ref 8.9–10.3)
Chloride: 104 mmol/L (ref 98–111)
Creatinine, Ser: 0.76 mg/dL (ref 0.44–1.00)
GFR, Estimated: >60 mL/min (ref >60)
Glucose, Bld: 148 mg/dL — ABNORMAL HIGH (ref 70–99)
Potassium: 4 mmol/L (ref 3.5–5.1)
Sodium: 138 mmol/L (ref 135–145)
Total Bilirubin: 0.5 mg/dL (ref 0.0–1.2)
Total Protein: 7.5 g/dL (ref 6.5–8.1)

## 2024-08-15 LAB — LIPASE, BLOOD: Lipase: 25 U/L (ref 11–51)

## 2024-08-15 MED ORDER — FAMOTIDINE 20 MG PO TABS: 20.0000 mg | ORAL_TABLET | Freq: Two times a day (BID) | ORAL | 0 refills | Status: AC

## 2024-08-15 MED ORDER — PANTOPRAZOLE SODIUM 40 MG PO TBEC: 40.0000 mg | DELAYED_RELEASE_TABLET | Freq: Every day | ORAL | 3 refills | Status: DC

## 2024-08-15 MED ORDER — LIDOCAINE VISCOUS HCL 2 % MT SOLN
15.0000 mL | Freq: Once | OROMUCOSAL | Status: DC
Start: 2024-08-15 — End: 2024-08-15
  Filled 2024-08-15: qty 15

## 2024-08-15 MED ORDER — SODIUM CHLORIDE 0.9 % IV BOLUS
1000.0000 mL | Freq: Once | INTRAVENOUS | Status: AC
  Administered 2024-08-15: 1000 mL via INTRAVENOUS

## 2024-08-15 MED ORDER — ONDANSETRON HCL 4 MG/2ML IJ SOLN
4.0000 mg | Freq: Once | INTRAMUSCULAR | Status: AC
  Administered 2024-08-15: 4 mg via INTRAVENOUS
  Filled 2024-08-15: qty 2

## 2024-08-15 MED ORDER — ONDANSETRON 4 MG PO TBDP: 4.0000 mg | ORAL_TABLET | Freq: Three times a day (TID) | ORAL | 0 refills | Status: DC | PRN

## 2024-08-15 MED ORDER — ALUM & MAG HYDROXIDE-SIMETH 200-200-20 MG/5ML PO SUSP
30.0000 mL | Freq: Once | ORAL | Status: AC
  Administered 2024-08-15: 30 mL via ORAL
  Filled 2024-08-15: qty 30

## 2024-08-15 NOTE — ED Notes (Signed)
 Discharge instructions, follow up care, and prescriptions reviewed and explained, pt verbalized understanding.

## 2024-08-15 NOTE — Discharge Instructions (Addendum)
 As we discussed, we will treat conservatively.  Is difficult to say whether or not this is from the [place] food last night.  I would like for you to stick with some clear liquids for the next couple of days and slowly progress your diet as tolerated.  I have given you 2 prescriptions.  Please take as prescribed.  You can return to work on Monday or sooner if you feel up to it.  Please follow-up with your primary care provider.  You may return to the emergency department for any worsening symptoms.

## 2024-08-15 NOTE — Telephone Encounter (Signed)
Patient present at ED 

## 2024-08-15 NOTE — ED Provider Notes (Signed)
 [place] EMERGENCY DEPARTMENT AT [facility] Provider Note   CSN: [id number] Arrival date & time: 08/15/24  9093     Patient presents with: Abdominal Pain   Peggy Schmidt is a 43 y.o. female diabetes, hypertension, hyperlipidemia who presents to the emerged from today for further evaluation of epigastric abdominal pain described as burning which started this morning.  Patient states that she had some [place] food last night around 9 PM.  She was not initially concerned for any foodborne illness at that time.  She woke up this morning and on the way to work she felt she was getting some motion sickness in the work truck followed by some epidural gastric abdominal pain and couple episodes of vomiting.  Patient is currently nauseous.  Also endorses some looser stools this morning.  No urinary symptoms.  No vaginal symptoms.  No upper respiratory symptoms.  No fever or chills.    Abdominal Pain      Prior to Admission medications   Medication Sig Start Date End Date Taking? Authorizing Provider  famotidine  (PEPCID ) 20 MG tablet Take 1 tablet (20 mg total) by mouth 2 (two) times daily. 08/15/24  Yes Theotis, Dahlton Hinde M, PA-C  ondansetron  (ZOFRAN -ODT) 4 MG disintegrating tablet Take 1 tablet (4 mg total) by mouth every 8 (eight) hours as needed for nausea or vomiting. 08/15/24  Yes Theotis, Calahan Pak M, PA-C  amitriptyline  (ELAVIL ) 25 MG tablet TAKE 1 TABLET BY MOUTH AT BEDTIME 05/08/23   Kennyth Worth HERO, MD  Blood Pressure KIT Use daily as needed to check BP. 06/30/23   Kennyth Worth HERO, MD  cyclobenzaprine  (FLEXERIL ) 10 MG tablet Take 1 tablet (10 mg total) by mouth 3 (three) times daily as needed for muscle spasms. 06/27/22   Kennyth Worth HERO, MD  famotidine  (PEPCID ) 20 MG tablet 1 tablet 2 times daily as needed for hives. 09/01/23   Kennyth Worth HERO, MD  ibuprofen  (ADVIL ) 800 MG tablet Take 800 mg by mouth every 8 (eight) hours as needed. 12/27/23   [provider]   olmesartan -hydrochlorothiazide  (BENICAR  HCT) 20-12.5 MG tablet TAKE A HALF TABLET BY MOUTH DAILY 08/13/24   Kennyth Worth HERO, MD  OZEMPIC , 2 MG/DOSE, 8 MG/3ML SOPN DIAL AND INJECT 2MG  UNDER THE SKIN ONCE A WEEK 12/19/23   Kennyth Worth HERO, MD  pantoprazole  (PROTONIX ) 40 MG tablet Take 1 tablet (40 mg total) by mouth daily. 08/15/24   Parker, Caleb M, MD  tretinoin (RETIN-A) 0.05 % cream Apply 1 Application topically at bedtime. 01/12/24   [provider]    Allergies: Black walnut flavoring agent (non-screening) and Other    Review of Systems  Gastrointestinal:  Positive for abdominal pain.  All other systems reviewed and are negative.   Updated Vital Signs BP 133/87 (BP Location: Right Arm)   Pulse 72   Temp 98.2 F (36.8 C)   Resp 16   Ht 5' 5 (1.651 m)   Wt 104.3 kg   LMP 08/05/2024   SpO2 100%   BMI 38.27 kg/m   Physical Exam Vitals and nursing note reviewed.  Constitutional:      General: She is not in acute distress.    Appearance: Normal appearance.  HENT:     Head: Normocephalic and atraumatic.  Eyes:     General:        Right eye: No discharge.        Left eye: No discharge.  Cardiovascular:     Comments: Regular rate and rhythm.  S1/S2 are distinct without any evidence of murmur, rubs, or gallops.  Radial pulses are 2+ bilaterally.  Dorsalis pedis pulses are 2+ bilaterally.  No evidence of pedal edema. Pulmonary:     Comments: Clear to auscultation bilaterally.  Normal effort.  No respiratory distress.  No evidence of wheezes, rales, or rhonchi heard throughout. Abdominal:     General: Abdomen is flat. Bowel sounds are normal. There is no distension.     Tenderness: There is abdominal tenderness in the epigastric area. There is no guarding or rebound.  Musculoskeletal:        General: Normal range of motion.     Cervical back: Neck supple.  Skin:    General: Skin is warm and dry.     Findings: No rash.  Neurological:     General: No focal deficit  present.     Mental Status: She is alert.  Psychiatric:        Mood and Affect: Mood normal.        Behavior: Behavior normal.     (all labs ordered are listed, but only abnormal results are displayed) Labs Reviewed  CBC WITH DIFFERENTIAL/PLATELET - Abnormal; Notable for the following components:      Result Value   RBC 5.12 (*)    All other components within normal limits  COMPREHENSIVE METABOLIC PANEL WITH GFR - Abnormal; Notable for the following components:   Glucose, Bld 148 (*)    All other components within normal limits  LIPASE, BLOOD  URINALYSIS, ROUTINE W REFLEX MICROSCOPIC    EKG: None  Radiology: No results found.   Procedures   Medications Ordered in the ED  lidocaine  (XYLOCAINE ) 2 % viscous mouth solution 15 mL (15 mLs Mouth/Throat Patient Refused/Not Given 08/15/24 1044)  alum & mag hydroxide-simeth (MAALOX/MYLANTA) 200-200-20 MG/5ML suspension 30 mL (30 mLs Oral Given 08/15/24 1043)  sodium chloride  0.9 % bolus 1,000 mL (1,000 mLs Intravenous New Bag/Given 08/15/24 1027)  ondansetron  (ZOFRAN ) injection 4 mg (4 mg Intravenous Given 08/15/24 1028)    Clinical Course as of 08/15/24 1113  Thu Aug 15, 2024  1031 Urinalysis, Routine w reflex microscopic -Urine, Clean Catch Negative.  [CF]  1031 CBC with Differential(!) Negative.  [CF]  1031 Comprehensive metabolic panel [CF]  1102 On reevaluation, patient is feeling better. Epigastric pain has improved. Fluids still running.  [CF]    Clinical Course User Index [CF] Theotis Cameron HERO, PA-C    Medical Decision Making Peggy Schmidt is a 43 y.o. female patient who presents to the emergency department today for further evaluation of abdominal pain, nausea, vomiting, diarrhea.  Patient overall stable with normal vital signs.  This could be gastroenteritis versus foodborne illness.  Patient is overall nontender on exam apart from mild epigastric tenderness.  Do not feel imaging is warranted at this time.  Will plan  to get some labs give her some fluids, and Zofran .  Plan to reassess.  Plan to discharge home.  Labs were all normal.  Patient feeling better after medications and fluids.  Strict turn precautions were discussed.  Will have her follow-up with her primary care doctor. Zofran  and Pepcid  sent to her pharmacy. She is safe for discharge.   Amount and/or Complexity of Data Reviewed Labs: ordered. Decision-making details documented in ED Course.  Risk OTC drugs. Prescription drug management.     Final diagnoses:  Epigastric pain    ED Discharge Orders          Ordered  ondansetron  (ZOFRAN -ODT) 4 MG disintegrating tablet  Every 8 hours PRN        08/15/24 1111    famotidine  (PEPCID ) 20 MG tablet  2 times daily        08/15/24 1111               Theotis Peers [place], [place] 08/15/24 1113    Ruthe Cornet, DO 08/15/24 1225

## 2024-08-15 NOTE — Telephone Encounter (Signed)
 FYI Only or Action Required?: FYI only for provider. - also sent refill request for pantoprazole   Patient was last seen in primary care on 01/18/2024 by Kennyth Worth HERO, MD.  Called Nurse Triage reporting Vomiting, Diarrhea, Headache, Abdominal Pain, Shortness of Breath, burning sensation in stomach, Nausea, and Fatigue.  Symptoms began today.  Interventions attempted: Rest, hydration, or home remedies and Dietary changes.  Symptoms are: rapidly worsening.  Triage Disposition: Go to ED Now (or PCP Triage)  Patient/caregiver understands and will follow disposition?: Yes      Copied from CRM [id number]. Topic: Clinical - Red Word Triage >> Aug 15, 2024  7:48 AM Peggy Schmidt wrote: Red Word that prompted transfer to Nurse Triage:  Patient is complaining of the following beginning this morning  Vomiting Lots of Bowel movements Headache Stomach Cramping  Stomach Lining is sore/irritation Reason for Disposition  Patient sounds very sick or weak to the triager  Answer Assessment - Initial Assessment Questions 1. VOMITING SEVERITY: How many times have you vomited in the past 24 hours?      2x 2. ONSET: When did the vomiting begin?      This morning 3. FLUIDS: What fluids or food have you vomited up today? Have you been able to keep any fluids down?     Keeping down water, had chinese food last night don't think anything undercooked 4. ABDOMEN PAIN: Are your having any abdomen pain? If Yes : How bad is it and what does it feel like? (e.g., crampy, dull, intermittent, constant)      7/10, my stomach lining feels like burning sensation 5. DIARRHEA: Is there any diarrhea? If Yes, ask: How many times today?      Not diarrhea just bubble guts, loose stool mushy 6. CONTACTS: Is there anyone else in the family with the same symptoms?      no 8. HYDRATION STATUS: Any signs of dehydration? (e.g., dry mouth [not only dry lips], too weak to stand) When did you last  urinate?     Still urinating, no dry mouth 9. OTHER SYMPTOMS: Do you have any other symptoms? (e.g., fever, headache, vertigo, vomiting blood or coffee grounds, recent head injury)       like gotta catch my breath too, SOB a little bit     Headache 4-5/10 No blood in vomit or stool No fever that know of No dizziness Feel weak, at work Get nausea, motion sickness while in car 10. PREGNANCY: Is there any chance you are pregnant? When was your last menstrual period?       no   Pantoprazole  expired, do I need to come in to do refill or if can just refill it [facility] on [address] in [facility] pt go to ED right away and have another adult drive her, pt stated intent to go to ED. Advised pt follow up with PCP after since requesting refill, sending refill request.  Protocols used: Vomiting-A-AH

## 2024-08-15 NOTE — ED Triage Notes (Signed)
 Pt c/o abd pain with n/v, HA, gen. Weakness today.

## 2024-09-02 ENCOUNTER — Telehealth: Payer: Self-pay | Admitting: *Deleted

## 2024-09-02 NOTE — Telephone Encounter (Signed)
 Copied from CRM [id number]. Topic: Clinical - Medication Question >> Sep 02, 2024 12:58 PM Peggy Schmidt wrote: Reason for CRM: Pt stated that the OZEMPIC , 2 MG/DOSE, 8 MG/3ML SOPN is now covered by the insurance and wants to confirm if she is able to go straight to the pharmacy to have the medication refilled or if she has to come into the office for appts to have the medication refilled. Pt would like a callback with an update.

## 2024-09-02 NOTE — Telephone Encounter (Signed)
 Please schedule an office visit with Dr Jimmey Ralph for medication refills

## 2024-09-02 NOTE — Telephone Encounter (Signed)
 Recommend she come in for office visit since we haven't seen her since the start of the year.

## 2024-09-02 NOTE — Telephone Encounter (Signed)
 Copied from CRM [id number]. Topic: Clinical - Medication Question >> Sep 02, 2024 12:58 PM Aisha D wrote: Reason for CRM: Pt stated that the OZEMPIC , 2 MG/DOSE, 8 MG/3ML SOPN is now covered by the insurance and wants to confirm if she is able to go straight to the pharmacy to have the medication refilled or if she has to come into the office for appts to have the medication refilled. Pt would like a callback with an update.

## 2024-09-11 ENCOUNTER — Telehealth: Payer: Self-pay

## 2024-09-11 ENCOUNTER — Other Ambulatory Visit (HOSPITAL_COMMUNITY): Payer: Self-pay

## 2024-09-11 NOTE — Telephone Encounter (Signed)
 Pharmacy Patient Advocate Encounter  Received notification from [employer] that Prior Authorization for Ozempic  (2 MG/DOSE) 8MG /3ML pen-injectors  has been APPROVED from 09/11/24 to 09/11/25   PA #/Case ID/Reference #: [id number]

## 2024-09-11 NOTE — Telephone Encounter (Signed)
 Pharmacy Patient Advocate Encounter   Received notification from Onbase that prior authorization for Ozempic  (2 MG/DOSE) 8MG /3ML pen-injectors is required/requested.   Insurance verification completed.   The patient is insured through [facility] .   Per test claim: PA required; PA submitted to above mentioned insurance via Latent Key/confirmation #/EOC [id number] Status is pending

## 2024-09-12 NOTE — Telephone Encounter (Signed)
 Left voice message Rx Ozempic  (2 MG/DOSE) 8MG /3ML pen-injectors  has been APPROVED from 09/11/24 to 09/11/25

## 2024-09-16 ENCOUNTER — Ambulatory Visit (INDEPENDENT_AMBULATORY_CARE_PROVIDER_SITE_OTHER): Payer: Self-pay | Admitting: Family Medicine

## 2024-09-16 ENCOUNTER — Encounter: Payer: Self-pay | Admitting: Family Medicine

## 2024-09-16 VITALS — BP 135/88 | HR 72 | Temp 98.1°F | Ht 65.0 in | Wt 258.0 lb

## 2024-09-16 DIAGNOSIS — G8929 Other chronic pain: Secondary | ICD-10-CM

## 2024-09-16 DIAGNOSIS — Z7985 Long-term (current) use of injectable non-insulin antidiabetic drugs: Secondary | ICD-10-CM

## 2024-09-16 DIAGNOSIS — E1159 Type 2 diabetes mellitus with other circulatory complications: Secondary | ICD-10-CM | POA: Diagnosis not present

## 2024-09-16 DIAGNOSIS — M25562 Pain in left knee: Secondary | ICD-10-CM | POA: Diagnosis not present

## 2024-09-16 DIAGNOSIS — I152 Hypertension secondary to endocrine disorders: Secondary | ICD-10-CM

## 2024-09-16 DIAGNOSIS — E1169 Type 2 diabetes mellitus with other specified complication: Secondary | ICD-10-CM | POA: Diagnosis not present

## 2024-09-16 DIAGNOSIS — F4329 Adjustment disorder with other symptoms: Secondary | ICD-10-CM

## 2024-09-16 LAB — POCT GLYCOSYLATED HEMOGLOBIN (HGB A1C): Hemoglobin A1C: 6.8 % — AB (ref 4.0–5.6)

## 2024-09-16 LAB — MICROALBUMIN / CREATININE URINE RATIO
Creatinine,U: 99 mg/dL
Microalb Creat Ratio: UNDETERMINED mg/g (ref 0.0–30.0)
Microalb, Ur: <0.7 mg/dL

## 2024-09-16 MED ORDER — AMITRIPTYLINE HCL 50 MG PO TABS: 50.0000 mg | ORAL_TABLET | Freq: Every day | ORAL | 3 refills | Status: AC

## 2024-09-16 MED ORDER — OZEMPIC (0.25 OR 0.5 MG/DOSE) 2 MG/3ML ~~LOC~~ SOPN: 0.2500 mg | PEN_INJECTOR | SUBCUTANEOUS | 5 refills | Status: DC

## 2024-09-16 NOTE — Assessment & Plan Note (Signed)
 A1c elevated to 6.8.  She has been off of Ozempic  the last few months.  Will restart 0.25 mg weekly.  She will increase as tolerated over the next several weeks.  She will follow-up with us  in a few weeks via MyChart and we can adjust as needed.  Recheck A1c in 3 months.

## 2024-09-16 NOTE — Assessment & Plan Note (Signed)
 At goal today on olmesartan  HCTZ 20-12.5 daily.

## 2024-09-16 NOTE — Assessment & Plan Note (Signed)
 Symptoms are worsening the last several months.  She has seen orthopedics for this in the past but it has been a few years since she has been evaluated.  She has previously had cortisone shots and gel shots without much improvement.  She has not had much response to over-the-counter NSAIDs or other oral medications.  Will place referral for her to go back to orthopedics for this.

## 2024-09-16 NOTE — Assessment & Plan Note (Signed)
 Symptoms are slightly worsened over the last several weeks.  She is currently on amitriptyline  25 mg nightly and tolerating well though she would like to increase the dose.  Will go to 50 mg nightly.  She is aware potential side effects.  She will follow with us  in a few weeks via MyChart we can adjust the dose as tolerated.

## 2024-09-16 NOTE — Progress Notes (Signed)
   Peggy Schmidt is a 43 y.o. female who presents today for an office visit.  Assessment/Plan:  Chronic Problems Addressed Today: DM (diabetes mellitus), type 2 (HCC) A1c elevated to 6.8.  She has been off of Ozempic  the last few months.  Will restart 0.25 mg weekly.  She will increase as tolerated over the next several weeks.  She will follow-up with us  in a few weeks via MyChart and we can adjust as needed.  Recheck A1c in 3 months.  Hypertension associated with diabetes (HCC) At goal today on olmesartan  HCTZ 20-12.5 daily.  Chronic pain of left knee Symptoms are worsening the last several months.  She has seen orthopedics for this in the past but it has been a few years since she has been evaluated.  She has previously had cortisone shots and gel shots without much improvement.  She has not had much response to over-the-counter NSAIDs or other oral medications.  Will place referral for her to go back to orthopedics for this.  Adjustment disorder Symptoms are slightly worsened over the last several weeks.  She is currently on amitriptyline  25 mg nightly and tolerating well though she would like to increase the dose.  Will go to 50 mg nightly.  She is aware potential side effects.  She will follow with us  in a few weeks via MyChart we can adjust the dose as tolerated.     Subjective:  HPI:  See assessment / plan for status of chronic conditions.   Discussed the use of AI scribe software for clinical note transcription with the patient, who gave verbal consent to proceed.  History of Present Illness Peggy Schmidt is a 43 year old female with diabetes who presents with elevated blood sugar levels and knee pain.  She has been off her diabetes medication, Ozempic , since May due to a lapse in insurance coverage, leading to increased blood sugar levels. Her A1c has risen from 6.0 to 6.8. She previously had good control of her diabetes while on Ozempic .  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 An MRI a few years ago showed no meniscus tear, and an orthopedic specialist attributed the pain to wear and tear. Previous treatments with cortisone and gel injections were ineffective. She finds some relief by staying off the knee and using a knee brace. No relief from ibuprofen  or Tylenol  for her knee pain.  She experiences episodes of racing thoughts and worsening anxiety.         Objective:  Physical Exam: BP 135/88   Pulse 72   Temp 98.1 F (36.7 C) (Temporal)   Ht 5' 5 (1.651 m)   Wt 258 lb (117 kg)   SpO2 97%   BMI 42.93 kg/m   Gen: No acute distress, resting comfortably CV: Regular rate and rhythm with no murmurs appreciated Pulm: Normal work of breathing, clear to auscultation bilaterally with no crackles, wheezes, or rhonchi Neuro: Grossly normal, moves all extremities Psych: Normal affect and thought content      Laureen Frederic M. Kennyth, MD 09/16/2024 9:16 AM

## 2024-09-16 NOTE — Patient Instructions (Signed)
 It was very nice to see you today!  VISIT SUMMARY: During your visit, we discussed your elevated blood sugar levels, knee pain, and symptoms of insomnia and anxiety. We have made adjustments to your medications and provided a referral for further evaluation of your knee pain.  YOUR PLAN: TYPE 2 DIABETES MELLITUS: Your blood sugar levels have increased due to a lapse in your diabetes medication, Ozempic . -Restart Ozempic  at 0.25 mg and increase to 0.5 mg as tolerated. -Monitor your blood glucose levels and adjust medication as needed. -A doctor's note was provided for work absence due to potential side effects.  INSOMNIA AND ANXIETY SYMPTOMS: You have been experiencing insomnia and anxiety with racing thoughts and occasional blackouts. -Increase amitriptyline  to 50 mg daily. -Monitor your symptoms and adjust treatment as needed. -Follow up in a few weeks to assess your response to the increased dosage.  CHRONIC RIGHT KNEE PAIN: You have chronic knee pain with swelling, which is made worse by your work activities. -Refer to [facility] for further evaluation. -Consider updated imaging studies as needed.  Return in about 3 months (around 12/16/2024) for Annual Physical.   Take care, Dr Kennyth  PLEASE NOTE:  If you had any lab tests, please let us  know if you have not heard back within a few days. You may see your results on mychart before we have a chance to review them but we will give you a call once they are reviewed by us .   If we ordered any referrals today, please let us  know if you have not heard from their office within the next week.   If you had any urgent prescriptions sent in today, please check with the pharmacy within an hour of our visit to make sure the prescription was transmitted appropriately.   Please try these tips to maintain a healthy lifestyle:  Eat at least 3 REAL meals and 1-2 snacks per day.  Aim for no more than 5 hours between eating.  If you eat  breakfast, please do so within one hour of getting up.   Each meal should contain half fruits/vegetables, one quarter protein, and one quarter carbs (no bigger than a computer mouse)  Cut down on sweet beverages. This includes juice, soda, and sweet tea.   Drink at least 1 glass of water with each meal and aim for at least 8 glasses per day  Exercise at least 150 minutes every week.

## 2024-09-17 ENCOUNTER — Ambulatory Visit: Payer: Self-pay | Admitting: Family Medicine

## 2024-09-17 ENCOUNTER — Ambulatory Visit: Payer: Self-pay

## 2024-09-17 NOTE — Progress Notes (Signed)
 Urine sample is normal.  We can recheck in a year.

## 2024-09-17 NOTE — Telephone Encounter (Signed)
 3rd attempt to reach patient. LVM to call [facility] office back at [phone]. Routed to [facility] office for follow-up.  Just got back on ozempic  and shes been nauseous all day and threw up 4 times. She knew it would happen when going back on ozempic  but she didn't think it would be this strong. She would like to get an extended doctors note from her practice

## 2024-09-17 NOTE — Telephone Encounter (Signed)
 Attempted contact x 1 to discuss symptoms from the Ozempic , no answer, Will attempt contact at a later time.                  Message from [place] sent at 09/17/2024  4:06 PM EDT  Just got back on ozempic  and shes been nauseous all day and threw up 4 times. She knew it would happen when going back on ozempic  but she didn't think it would be this strong. She would like to get an extended doctors note from her practice

## 2024-09-17 NOTE — Telephone Encounter (Signed)
 2nd attempt to reach pt, returning earlier call. LVM to return call.  Just got back on ozempic  and shes been nauseous all day and threw up 4 times. She knew it would happen when going back on ozempic  but she didn't think it would be this strong. She would like to get an extended doctors note from her practice

## 2024-09-18 ENCOUNTER — Other Ambulatory Visit: Payer: Self-pay

## 2024-09-18 ENCOUNTER — Ambulatory Visit: Payer: Self-pay

## 2024-09-18 ENCOUNTER — Emergency Department (HOSPITAL_BASED_OUTPATIENT_CLINIC_OR_DEPARTMENT_OTHER)
Admission: EM | Admit: 2024-09-18 | Discharge: 2024-09-18 | Disposition: A | Discharge status: Home / Self Care | Attending: Emergency Medicine | Admitting: Emergency Medicine

## 2024-09-18 DIAGNOSIS — R112 Nausea with vomiting, unspecified: Secondary | ICD-10-CM | POA: Diagnosis not present

## 2024-09-18 DIAGNOSIS — R1110 Vomiting, unspecified: Secondary | ICD-10-CM | POA: Diagnosis not present

## 2024-09-18 DIAGNOSIS — E119 Type 2 diabetes mellitus without complications: Secondary | ICD-10-CM | POA: Insufficient documentation

## 2024-09-18 LAB — COMPREHENSIVE METABOLIC PANEL WITH GFR
ALT: 8 U/L (ref 0–44)
AST: 19 U/L (ref 15–41)
Albumin: 4.6 g/dL (ref 3.5–5.0)
Alkaline Phosphatase: 98 U/L (ref 38–126)
Anion gap: 13 (ref 5–15)
BUN: 10 mg/dL (ref 6–20)
CO2: 25 mmol/L (ref 22–32)
Calcium: 10 mg/dL (ref 8.9–10.3)
Chloride: 104 mmol/L (ref 98–111)
Creatinine, Ser: 0.77 mg/dL (ref 0.44–1.00)
GFR, Estimated: >60 mL/min (ref >60)
Glucose, Bld: 95 mg/dL (ref 70–99)
Potassium: 3.6 mmol/L (ref 3.5–5.1)
Sodium: 141 mmol/L (ref 135–145)
Total Bilirubin: 0.7 mg/dL (ref 0.0–1.2)
Total Protein: 8.2 g/dL — ABNORMAL HIGH (ref 6.5–8.1)

## 2024-09-18 LAB — CBC WITH DIFFERENTIAL/PLATELET
Abs Immature Granulocytes: 0.03 K/uL (ref 0.00–0.07)
Basophils Absolute: 0 K/uL (ref 0.0–0.1)
Basophils Relative: 0 %
Eosinophils Absolute: 0 K/uL (ref 0.0–0.5)
Eosinophils Relative: 0 %
HCT: 43.7 % (ref 36.0–46.0)
Hemoglobin: 14.6 g/dL (ref 12.0–15.0)
Immature Granulocytes: 0 %
Lymphocytes Relative: 14 %
Lymphs Abs: 1.5 K/uL (ref 0.7–4.0)
MCH: 28.3 pg (ref 26.0–34.0)
MCHC: 33.4 g/dL (ref 30.0–36.0)
MCV: 84.9 fL (ref 80.0–100.0)
Monocytes Absolute: 0.9 K/uL (ref 0.1–1.0)
Monocytes Relative: 8 %
Neutro Abs: 8.4 K/uL — ABNORMAL HIGH (ref 1.7–7.7)
Neutrophils Relative %: 78 %
Platelets: 346 K/uL (ref 150–400)
RBC: 5.15 MIL/uL — ABNORMAL HIGH (ref 3.87–5.11)
RDW: 13.4 % (ref 11.5–15.5)
WBC: 10.9 K/uL — ABNORMAL HIGH (ref 4.0–10.5)
nRBC: 0 % (ref 0.0–0.2)

## 2024-09-18 LAB — LIPASE, BLOOD: Lipase: 15 U/L (ref 11–51)

## 2024-09-18 LAB — HCG, SERUM, QUALITATIVE: Preg, Serum: NEGATIVE

## 2024-09-18 MED ORDER — SODIUM CHLORIDE 0.9 % IV BOLUS
1000.0000 mL | Freq: Once | INTRAVENOUS | Status: AC
  Administered 2024-09-18: 1000 mL via INTRAVENOUS

## 2024-09-18 MED ORDER — ONDANSETRON 4 MG PO TBDP: 4.0000 mg | ORAL_TABLET | Freq: Three times a day (TID) | ORAL | 0 refills | Status: AC | PRN

## 2024-09-18 MED ORDER — METOCLOPRAMIDE HCL 10 MG PO TABS: 10.0000 mg | ORAL_TABLET | Freq: Three times a day (TID) | ORAL | 0 refills | Status: DC | PRN

## 2024-09-18 MED ORDER — ACETAMINOPHEN 325 MG PO TABS
650.0000 mg | ORAL_TABLET | Freq: Once | ORAL | Status: AC
Start: 2024-09-18 — End: 2024-09-18
  Administered 2024-09-18: 650 mg via ORAL
  Filled 2024-09-18: qty 2

## 2024-09-18 MED ORDER — METOCLOPRAMIDE HCL 5 MG/ML IJ SOLN
10.0000 mg | Freq: Once | INTRAMUSCULAR | Status: AC
  Administered 2024-09-18: 10 mg via INTRAVENOUS
  Filled 2024-09-18: qty 2

## 2024-09-18 MED ORDER — ONDANSETRON HCL 4 MG/2ML IJ SOLN
4.0000 mg | Freq: Once | INTRAMUSCULAR | Status: AC
  Administered 2024-09-18: 4 mg via INTRAVENOUS
  Filled 2024-09-18: qty 2

## 2024-09-18 NOTE — ED Notes (Signed)
 Pt still feels like she cannot provide urine sample.

## 2024-09-18 NOTE — ED Notes (Signed)
Pt given graham crackers and water for a PO challenge.

## 2024-09-18 NOTE — Discharge Instructions (Addendum)
 Call your doctor to discuss your medications including your Ozempic .  Otherwise, if you develop new or worsening or uncontrolled vomiting, vomiting blood, abdominal pain, or any other new/concerning symptoms then return to the ER

## 2024-09-18 NOTE — ED Triage Notes (Signed)
 Reports emesis since getting ozempic  yesterday. Abd cramping.

## 2024-09-18 NOTE — Telephone Encounter (Signed)
 FYI Only or Action Required?: FYI only for provider.  Patient was last seen in primary care on 09/16/2024 by Kennyth Worth HERO, MD.  Called Nurse Triage reporting Vomiting.  Symptoms began yesterday.  Interventions attempted: Nothing.  Symptoms are: rapidly worsening.  Triage Disposition: Go to ED Now (or PCP Triage)  Patient/caregiver understands and will follow disposition?: Yes      Copied from CRM [id number]. Topic: Clinical - Red Word Triage >> Sep 18, 2024  7:47 AM Robinson H wrote: Red Word that prompted transfer to Nurse Triage: Vomiting and nausea since Tuesday when she started taking the Semaglutide ,0.25 or 0.5MG /DOS, (OZEMPIC , 0.25 OR 0.5 MG/DOSE,) 2 MG/3ML SOPN, can't hold nothing down Reason for Disposition  [1] SEVERE vomiting (e.g., 6 or more times/day) AND [2] present > 8 hours (Exception: Patient sounds well, is drinking liquids, does not sound dehydrated, and vomiting has lasted less than 24 hours.)  Answer Assessment - Initial Assessment Questions 1. VOMITING SEVERITY: How many times have you vomited in the past 24 hours?      15 2. ONSET: When did the vomiting begin?      Yesterday, states took ozempic  at 7a yesterday and vomiting started around 8 am 3. FLUIDS: What fluids or food have you vomited up today? Have you been able to keep any fluids down?     denies 4. ABDOMEN PAIN: Are your having any abdomen pain? If Yes : How bad is it and what does it feel like? (e.g., crampy, dull, intermittent, constant)      denies 5. DIARRHEA: Is there any diarrhea? If Yes, ask: How many times today?      no 6. CONTACTS: Is there anyone else in the family with the same symptoms?      no 7. CAUSE: What do you think is causing your vomiting?     Ozempic  8. HYDRATION STATUS: Any signs of dehydration? (e.g., dry mouth [not only dry lips], too weak to stand) When did you last urinate?     Sounds weak on the phone, dry mouth,  9. OTHER SYMPTOMS: Do you  have any other symptoms? (e.g., fever, headache, vertigo, vomiting blood or coffee grounds, recent head injury)     headache 10. PREGNANCY: Is there any chance you are pregnant? When was your last menstrual period?       na  Protocols used: Vomiting-A-AH

## 2024-09-18 NOTE — ED Provider Notes (Signed)
 [place] EMERGENCY DEPARTMENT AT [facility] Provider Note   CSN: [id number] Arrival date & time: 09/18/24  9082     Patient presents with: Emesis   Peggy Schmidt is a 43 y.o. female.   HPI 43 year old female with a history of diabetes presents with vomiting.  Due to job and insurance issues she has been off of her Ozempic  for several months.  Got insurance back and saw her PCP and was prescribed Ozempic  2 days ago.  Took her first dose yesterday morning at 7:30 AM and then about 35 minutes later started having vomiting.  She has been having vomiting ever since.  No diarrhea, hematemesis, fever, abdominal pain, urinary symptoms, back pain, chest pain.  She has had some vomiting issues with Ozempic  in the past but think she was started at too high of a dose this time.  Its never been this bad.  Cannot keep down water.  Prior to Admission medications   Medication Sig Start Date End Date Taking? Authorizing Provider  metoCLOPramide (REGLAN) 10 MG tablet Take 1 tablet (10 mg total) by mouth every 8 (eight) hours as needed for nausea. 09/18/24  Yes Freddi Hamilton, MD  ondansetron  (ZOFRAN -ODT) 4 MG disintegrating tablet Take 1 tablet (4 mg total) by mouth every 8 (eight) hours as needed for nausea or vomiting. 09/18/24  Yes Freddi Hamilton, MD  amitriptyline  (ELAVIL ) 50 MG tablet Take 1 tablet (50 mg total) by mouth at bedtime. 09/16/24   Kennyth Worth HERO, MD  Blood Pressure KIT Use daily as needed to check BP. 06/30/23   Kennyth Worth HERO, MD  cyclobenzaprine  (FLEXERIL ) 10 MG tablet Take 1 tablet (10 mg total) by mouth 3 (three) times daily as needed for muscle spasms. 06/27/22   Kennyth Worth HERO, MD  famotidine  (PEPCID ) 20 MG tablet 1 tablet 2 times daily as needed for hives. 09/01/23   Kennyth Worth HERO, MD  famotidine  (PEPCID ) 20 MG tablet Take 1 tablet (20 mg total) by mouth 2 (two) times daily. 08/15/24   Theotis Peers M, PA-C  ibuprofen  (ADVIL ) 800 MG tablet Take 800 mg by mouth every 8  (eight) hours as needed. 12/27/23   [provider]  olmesartan -hydrochlorothiazide  (BENICAR  HCT) 20-12.5 MG tablet TAKE A HALF TABLET BY MOUTH DAILY 08/13/24   Kennyth Worth HERO, MD  pantoprazole  (PROTONIX ) 40 MG tablet Take 1 tablet (40 mg total) by mouth daily. 08/15/24   Kennyth Worth HERO, MD  Semaglutide ,0.25 or 0.5MG /DOS, (OZEMPIC , 0.25 OR 0.5 MG/DOSE,) 2 MG/3ML SOPN Inject 0.25-0.5 mg into the skin once a week. 09/16/24   Parker, Caleb M, MD  tretinoin (RETIN-A) 0.05 % cream Apply 1 Application topically at bedtime. 01/12/24   [provider]    Allergies: Black walnut flavoring agent (non-screening) and Other    Review of Systems  Constitutional:  Negative for fever.  Cardiovascular:  Negative for chest pain.  Gastrointestinal:  Positive for nausea and vomiting. Negative for abdominal pain and diarrhea.  Genitourinary:  Negative for dysuria.  Musculoskeletal:  Negative for back pain.    Updated Vital Signs BP (!) 147/92 (BP Location: Right Arm)   Pulse 90   Temp 98.4 F (36.9 C) (Oral)   Resp 18   SpO2 100%   Physical Exam Vitals and nursing note reviewed.  Constitutional:      General: She is not in acute distress.    Appearance: She is well-developed. She is obese. She is not ill-appearing or diaphoretic.  HENT:  Head: Normocephalic and atraumatic.  Cardiovascular:     Rate and Rhythm: Normal rate and regular rhythm.     Heart sounds: Normal heart sounds.  Pulmonary:     Effort: Pulmonary effort is normal.     Breath sounds: Normal breath sounds.  Abdominal:     General: There is no distension.     Palpations: Abdomen is soft.     Tenderness: There is no abdominal tenderness. There is no right CVA tenderness or left CVA tenderness.  Skin:    General: Skin is warm and dry.  Neurological:     Mental Status: She is alert.     (all labs ordered are listed, but only abnormal results are displayed) Labs Reviewed  CBC WITH DIFFERENTIAL/PLATELET -  Abnormal; Notable for the following components:      Result Value   WBC 10.9 (*)    RBC 5.15 (*)    Neutro Abs 8.4 (*)    All other components within normal limits  COMPREHENSIVE METABOLIC PANEL WITH GFR - Abnormal; Notable for the following components:   Total Protein 8.2 (*)    All other components within normal limits  HCG, SERUM, QUALITATIVE  LIPASE, BLOOD  URINALYSIS, ROUTINE W REFLEX MICROSCOPIC    EKG: None  Radiology: No results found.   Procedures   Medications Ordered in the ED  sodium chloride  0.9 % bolus 1,000 mL (0 mLs Intravenous Stopped 09/18/24 1213)  ondansetron  (ZOFRAN ) injection 4 mg (4 mg Intravenous Given 09/18/24 1016)  metoCLOPramide (REGLAN) injection 10 mg (10 mg Intravenous Given 09/18/24 1209)  acetaminophen  (TYLENOL ) tablet 650 mg (650 mg Oral Given 09/18/24 1312)                                    Medical Decision Making Amount and/or Complexity of Data Reviewed Labs: ordered.    Details: Unremarkable electrolytes  Risk OTC drugs. Prescription drug management.   Patient with vomiting.  No abdominal pain or tenderness on exam.  Could be related to her Ozempic  versus a GI illness.  Leukocytosis is mild and probably a response to vomiting.  Doubt an acute infection.  Has a benign abdominal exam, I do not think acute imaging is needed.  Otherwise, she is feeling better with some fluids and antiemetics and has not vomited here.  Was able to take in some p.o. food and fluid.  Appears stable for discharge home with supportive care at home and discussed she should talk to her doctor about continuing Ozempic  or not.  Will discharge home with return precautions.     Final diagnoses:  Nausea and vomiting, unspecified vomiting type    ED Discharge Orders          Ordered    ondansetron  (ZOFRAN -ODT) 4 MG disintegrating tablet  Every 8 hours PRN        09/18/24 1305    metoCLOPramide (REGLAN) 10 MG tablet  Every 8 hours PRN        09/18/24 1305                Freddi Hamilton, MD 09/18/24 1421

## 2024-09-19 NOTE — Telephone Encounter (Signed)
 Left message to return call to our office at their convenience.

## 2024-09-24 ENCOUNTER — Telehealth: Payer: Self-pay

## 2024-09-24 NOTE — Telephone Encounter (Signed)
 Copied from CRM [id number]. Topic: General - Call Back - No Documentation >> Sep 19, 2024 12:51 PM Alfonso HERO wrote: Reason for CRM: Returning call from [facility] asking for a call back.

## 2024-09-25 NOTE — Telephone Encounter (Signed)
 LVM returning call If no changes since last ED visit please give us  a call to schedule an appt with Dr Kennyth

## 2024-10-02 ENCOUNTER — Encounter: Payer: Self-pay | Admitting: Family Medicine

## 2024-10-02 ENCOUNTER — Ambulatory Visit: Payer: Self-pay

## 2024-10-02 NOTE — Telephone Encounter (Signed)
 Can we clarify what dose she is on?  She was supposed to restart at 0.25 mg weekly.

## 2024-10-02 NOTE — Telephone Encounter (Signed)
 See note

## 2024-10-02 NOTE — Telephone Encounter (Signed)
 FYI Only or Action Required?: Action required by provider: clinical question for provider.  Patient was last seen in primary care on 09/16/2024 by Kennyth Worth HERO, MD.  Called Nurse Triage reporting Medication Reaction.  Symptoms began several weeks ago.  Interventions attempted: Prescription medications: reglan.  Symptoms are: unchanged.  Triage Disposition: Call PCP Now  Patient/caregiver understands and will follow disposition?: Yes   Pt is requesting what to do with medication. Rn advised pt hold medication until further instructions.

## 2024-10-02 NOTE — Telephone Encounter (Signed)
 Reason for Disposition  [1] Caller has URGENT medicine question about med that primary care doctor (or NP/PA) or specialist prescribed AND [2] triager unable to answer question  Answer Assessment - Initial Assessment Questions Pt states that two weeks ago on a Tuesday she took the shot and then began throwing up 24/7. She ended up in the Er and they gave her nausea meds and IV fluids because she was dehydrated. She's tates that she feels nauseous any time she eats, has burps that taste like rotten eggs. She states when she throws up she feels better and the rotten egg burbs stop. She states today she began to have diarrhea.  She thinks she is on the 2mg  dose.   1. NAME of MEDICINE: What medicine(s) are you calling about?     ozempic  2. QUESTION: What is your question? (e.g., double dose of medicine, side effect)     Side effects 3. PRESCRIBER: Who prescribed the medicine? Reason: if prescribed by specialist, call should be referred to that group. Dr. Kennyth  4. SYMPTOMS: Do you have any symptoms? If Yes, ask: What symptoms are you having?  How bad are the symptoms (e.g., mild, moderate, severe) Yes, nausea, vomiting, diarrhea, sulfur burbs  Protocols used: Medication Question Call-A-AH

## 2024-10-02 NOTE — Telephone Encounter (Signed)
 Spoke with patient stated taking 2ml dose  Patient was advise to go to [facility] pharmacy or come here to verified correct dose in pen needle

## 2024-10-03 NOTE — Telephone Encounter (Signed)
 She needs to be on the 0.25 mg dose as we discussed at her visit here. The current symptoms she is experiencing should improve over the next few days though she should let us  know if she is having worsening symptoms.

## 2024-10-03 NOTE — Telephone Encounter (Signed)
 Spoke with patient  She needs to be on the 0.25 mg dose as we discussed at her visit here. The current symptoms she is experiencing should improve over the next few days though she should let us  know if she is having worsening symptoms. Verbalized understanding

## 2024-10-03 NOTE — Telephone Encounter (Signed)
 duplicate

## 2024-10-11 ENCOUNTER — Telehealth: Payer: Self-pay

## 2024-10-11 ENCOUNTER — Encounter: Payer: Self-pay | Admitting: Family Medicine

## 2024-10-11 ENCOUNTER — Other Ambulatory Visit (HOSPITAL_COMMUNITY): Payer: Self-pay

## 2024-10-11 ENCOUNTER — Ambulatory Visit: Admitting: Family Medicine

## 2024-10-11 VITALS — BP 116/84 | HR 84 | Temp 97.7°F | Ht 65.0 in | Wt 253.0 lb

## 2024-10-11 DIAGNOSIS — I152 Hypertension secondary to endocrine disorders: Secondary | ICD-10-CM

## 2024-10-11 DIAGNOSIS — E1159 Type 2 diabetes mellitus with other circulatory complications: Secondary | ICD-10-CM

## 2024-10-11 DIAGNOSIS — K219 Gastro-esophageal reflux disease without esophagitis: Secondary | ICD-10-CM | POA: Diagnosis not present

## 2024-10-11 DIAGNOSIS — E1169 Type 2 diabetes mellitus with other specified complication: Secondary | ICD-10-CM

## 2024-10-11 DIAGNOSIS — Z7985 Long-term (current) use of injectable non-insulin antidiabetic drugs: Secondary | ICD-10-CM | POA: Diagnosis not present

## 2024-10-11 DIAGNOSIS — M79661 Pain in right lower leg: Secondary | ICD-10-CM

## 2024-10-11 MED ORDER — SIMETHICONE 125 MG PO CHEW: 125.0000 mg | CHEWABLE_TABLET | Freq: Four times a day (QID) | ORAL | 0 refills | Status: AC | PRN

## 2024-10-11 MED ORDER — TIRZEPATIDE 2.5 MG/0.5ML ~~LOC~~ SOAJ: 2.5000 mg | SUBCUTANEOUS | 0 refills | Status: DC

## 2024-10-11 MED ORDER — MELOXICAM 15 MG PO TABS: 15.0000 mg | ORAL_TABLET | Freq: Every day | ORAL | 0 refills | Status: AC

## 2024-10-11 NOTE — Assessment & Plan Note (Signed)
 Symptoms are stable on Protonix  40 mg daily.

## 2024-10-11 NOTE — Assessment & Plan Note (Addendum)
 Too early to recheck her A1c today however she has been having some side effects with Ozempic .  Her initial issues with nausea or vomiting was likely due to restarting at 2 mg weekly though she has been off this for couple weeks and has been at the 0.25 mg weekly dose though still having ongoing issues with nausea and belching.  She has been using Zofran  with some improvement.  Will add on simethicone  however did discuss with patient would be reasonable for us  to try Mounjaro at this point to see if this is better tolerated.  She is agreeable to this.  Will switch her Ozempic  to Mounjaro 2.5 mg weekly.  She will follow-up with us  in a few weeks and we can adjust the dose as tolerated.

## 2024-10-11 NOTE — Telephone Encounter (Signed)
 Pharmacy Patient Advocate Encounter   Received notification from Onbase that prior authorization for Mounjaro 2.5MG /0.5ML auto-injectors is required/requested.   Insurance verification completed.   The patient is insured through [facility].   Per test claim: PA required; PA submitted to above mentioned insurance via Latent Key/confirmation #/EOC [id number] Status is pending

## 2024-10-11 NOTE — Progress Notes (Signed)
 Peggy Schmidt is a 43 y.o. female who presents today for an office visit.  Assessment/Plan:  New/Acute Problems: Right Calf Strain Patient injured right calf a week ago while playing football.  No red flags or signs for rupture.  We discussed conservative  management including ice and elevation, stretching and relative rest.  Will start meloxicam  for the next 1 to 2 weeks.  We also discussed home exercise program and handout was given.  She will let us  know if not improving and would consider imaging versus referral to sports medicine or PT at that time.  Chronic Problems Addressed Today: DM (diabetes mellitus), type 2 (HCC) Too early to recheck her A1c today however she has been having some side effects with Ozempic .  Her initial issues with nausea or vomiting was likely due to restarting at 2 mg weekly though she has been off this for couple weeks and has been at the 0.25 mg weekly dose though still having ongoing issues with nausea and belching.  She has been using Zofran  with some improvement.  Will add on simethicone  however did discuss with patient would be reasonable for us  to try Mounjaro at this point to see if this is better tolerated.  She is agreeable to this.  Will switch her Ozempic  to Mounjaro 2.5 mg weekly.  She will follow-up with us  in a few weeks and we can adjust the dose as tolerated.  Hypertension associated with diabetes (HCC)  at goal today on olmesartan  HCTZ 20-12.5 daily.  GERD (gastroesophageal reflux disease) Symptoms are stable on Protonix  40 mg daily.     Subjective:  HPI:  See assessment / plan for status of chronic conditions.  Patient is here today for follow-up.  I last saw her about 3 weeks ago.  At that time we restarted her on Ozempic .  Unfortunately it seems like there was a mixup at the pharmacy and she was restarted at 2 mg weekly on Ozempic  subsequently had severe nausea vomiting and discomfort.  Ended up going to the emergency room and workup was  negative.  She was given a dose of Reglan and discharged home.  Discussed the use of AI scribe software for clinical note transcription with the patient, who gave verbal consent to proceed.  History of Present Illness Peggy Schmidt is a 43 year old female who presents with severe nausea and vomiting due to a medication dosing error.  Despite the correction in dosage, she continues to experience nausea and occasional vomiting, describing a persistent 'rotten egg' burp that is bothersome. The nausea is not as severe as before but still occurs intermittently. She has been using ondansetron  for nausea relief. No constipation, but she reports diarrhea, which is unusual for her. She continues to take Protonix  for acid reflux.  She reports a recent calf muscle strain sustained while playing football last Saturday. The injury resulted in swelling initially, and she experiences tightness and pain when walking. She has been using ibuprofen  and massage for relief, with some improvement noted.  She inquires about vitamin supplementation for energy, eyesight, and bone health.         Objective:  Physical Exam: BP 116/84   Pulse 84   Temp 97.7 F (36.5 C) (Temporal)   Ht 5' 5 (1.651 m)   Wt 253 lb (114.8 kg)   LMP 10/06/2024   SpO2 99%   BMI 42.10 kg/m   Gen: No acute distress, resting comfortably MUSCULOSKELETAL - Right Calf: No deformities.  Nontender to  palpation.  Neuro vascularly intact distally.  Negative Thompson test. Neuro: Grossly normal, moves all extremities Psych: Normal affect and thought content      Omario Ander M. Kennyth, MD 10/11/2024 11:42 AM

## 2024-10-11 NOTE — Assessment & Plan Note (Signed)
 at goal today on olmesartan  HCTZ 20-12.5 daily.

## 2024-10-11 NOTE — Patient Instructions (Signed)
 It was very nice to see you today!  VISIT SUMMARY: You visited today due to severe nausea and vomiting caused by a medication dosing error with Ozempic . We also discussed your recent calf muscle strain and your interest in vitamin supplementation.  YOUR PLAN: ADVERSE EFFECTS OF OZEMPIC  (SEMAGLUTIDE ): You experienced severe nausea, vomiting, and discomfort due to an incorrect dose of Ozempic . Although the dose has been corrected, you still have some nausea, diarrhea, and burping. -Stop taking Ozempic  and switch to Mounjaro 2.5 mg weekly. Your prescription will be sent to [facility] pharmacy.  CALF MUSCLE STRAIN: You strained your calf muscle while playing football, resulting in tightness and pain. -Continue using ibuprofen  and massage for relief. -Start taking meloxicam  for additional anti-inflammatory effect. A prescription will be provided. -Use ice and elevate your leg to reduce swelling. -Follow the printed stretching exercises provided. -Send a follow-up message in a few weeks to assess your progress.  VITAMIN SUPPLEMENTATION: You inquired about vitamins for energy, eyesight, and bone health. -Consider taking a daily multivitamin that includes vitamins A, C, D, E, and B-complex, as well as calcium and magnesium for bone health.  Return if symptoms worsen or fail to improve.   Take care, Dr Kennyth  PLEASE NOTE:  If you had any lab tests, please let us  know if you have not heard back within a few days. You may see your results on mychart before we have a chance to review them but we will give you a call once they are reviewed by us .   If we ordered any referrals today, please let us  know if you have not heard from their office within the next week.   If you had any urgent prescriptions sent in today, please check with the pharmacy within an hour of our visit to make sure the prescription was transmitted appropriately.   Please try these tips to maintain a healthy  lifestyle:  Eat at least 3 REAL meals and 1-2 snacks per day.  Aim for no more than 5 hours between eating.  If you eat breakfast, please do so within one hour of getting up.   Each meal should contain half fruits/vegetables, one quarter protein, and one quarter carbs (no bigger than a computer mouse)  Cut down on sweet beverages. This includes juice, soda, and sweet tea.   Drink at least 1 glass of water with each meal and aim for at least 8 glasses per day  Exercise at least 150 minutes every week.

## 2024-10-14 NOTE — Telephone Encounter (Signed)
 Pharmacy Patient Advocate Encounter  Received notification from [employer] that Prior Authorization for Mounjaro 2.5MG /0.5ML auto-injectors  has been APPROVED from 10/11/24 to 10/11/25   PA #/Case ID/Reference #: [id number]

## 2024-10-14 NOTE — Telephone Encounter (Signed)
 LVM Rx Mounjaro 2.5MG /0.5ML auto-injectors  has been APPROVED from 10/11/24 to 10/11/25

## 2024-10-30 ENCOUNTER — Other Ambulatory Visit: Payer: Self-pay | Admitting: Family Medicine

## 2024-11-09 ENCOUNTER — Other Ambulatory Visit: Payer: Self-pay | Admitting: Family Medicine

## 2024-11-21 DIAGNOSIS — Z79899 Other long term (current) drug therapy: Secondary | ICD-10-CM | POA: Diagnosis not present

## 2024-11-21 DIAGNOSIS — M25561 Pain in right knee: Secondary | ICD-10-CM | POA: Diagnosis not present

## 2024-11-21 DIAGNOSIS — G8929 Other chronic pain: Secondary | ICD-10-CM | POA: Diagnosis not present

## 2024-11-21 DIAGNOSIS — M25461 Effusion, right knee: Secondary | ICD-10-CM | POA: Diagnosis not present

## 2024-11-21 DIAGNOSIS — M7989 Other specified soft tissue disorders: Secondary | ICD-10-CM | POA: Diagnosis not present

## 2024-11-21 DIAGNOSIS — M25562 Pain in left knee: Secondary | ICD-10-CM | POA: Diagnosis not present

## 2024-11-21 DIAGNOSIS — M25462 Effusion, left knee: Secondary | ICD-10-CM | POA: Diagnosis not present

## 2024-11-21 DIAGNOSIS — I10 Essential (primary) hypertension: Secondary | ICD-10-CM | POA: Diagnosis not present

## 2024-11-26 ENCOUNTER — Encounter: Payer: Self-pay | Admitting: Family Medicine

## 2024-11-27 ENCOUNTER — Other Ambulatory Visit: Payer: Self-pay | Admitting: *Deleted

## 2024-11-27 MED ORDER — TIRZEPATIDE 2.5 MG/0.5ML ~~LOC~~ SOAJ: 2.5000 mg | SUBCUTANEOUS | 0 refills | Status: DC

## 2024-11-27 NOTE — Telephone Encounter (Signed)
 Rx send to [facility]: [address] [place], [place] [address].

## 2024-12-04 DIAGNOSIS — M1710 Unilateral primary osteoarthritis, unspecified knee: Secondary | ICD-10-CM | POA: Diagnosis not present

## 2024-12-04 DIAGNOSIS — M170 Bilateral primary osteoarthritis of knee: Secondary | ICD-10-CM | POA: Diagnosis not present

## 2024-12-26 ENCOUNTER — Other Ambulatory Visit: Payer: Self-pay | Admitting: Family Medicine

## 2025-01-09 ENCOUNTER — Other Ambulatory Visit: Payer: Self-pay | Admitting: *Deleted

## 2025-01-09 MED ORDER — OLMESARTAN MEDOXOMIL-HCTZ 20-12.5 MG PO TABS: 0.5000 | ORAL_TABLET | Freq: Every day | ORAL | 1 refills | Status: AC

## 2025-01-20 ENCOUNTER — Telehealth: Payer: Self-pay

## 2025-01-20 NOTE — Telephone Encounter (Signed)
 Called patient to make an appointment. Left LV.   Copied from CRM #[id number]. Topic: Clinical - Request for Lab/Test Order >> Jan 20, 2025  7:50 AM Peggy Schmidt wrote: Reason for CRM: Patient would like to have A1C and blood pressure checked

## 2025-01-20 NOTE — Telephone Encounter (Signed)
 Scheduled for 02/10/25.

## 2025-02-10 ENCOUNTER — Ambulatory Visit: Admitting: Family Medicine
